# Patient Record
Sex: Female | Born: 1935
Health system: Southern US, Community
[De-identification: ages and names within clinical notes are randomized; demographics above are authoritative.]

## PROBLEM LIST (undated history)

## (undated) DIAGNOSIS — M419 Scoliosis, unspecified: Secondary | ICD-10-CM

## (undated) DIAGNOSIS — K219 Gastro-esophageal reflux disease without esophagitis: Secondary | ICD-10-CM

## (undated) DIAGNOSIS — I480 Paroxysmal atrial fibrillation: Secondary | ICD-10-CM

## (undated) DIAGNOSIS — I340 Nonrheumatic mitral (valve) insufficiency: Secondary | ICD-10-CM

## (undated) DIAGNOSIS — I5042 Chronic combined systolic (congestive) and diastolic (congestive) heart failure: Secondary | ICD-10-CM

## (undated) HISTORY — PX: PARTIAL HYSTERECTOMY: SHX80

## (undated) HISTORY — PX: TONSILLECTOMY AND ADENOIDECTOMY: SHX28

---

## 1998-08-17 ENCOUNTER — Other Ambulatory Visit: Admission: RE | Admit: 1998-08-17 | Discharge: 1998-08-17 | Payer: Self-pay

## 1999-07-16 ENCOUNTER — Other Ambulatory Visit: Admission: RE | Admit: 1999-07-16 | Discharge: 1999-07-16 | Payer: Self-pay | Admitting: *Deleted

## 1999-10-24 ENCOUNTER — Encounter: Admission: RE | Admit: 1999-10-24 | Discharge: 1999-10-24 | Payer: Self-pay | Admitting: Family Medicine

## 1999-10-24 ENCOUNTER — Encounter: Payer: Self-pay | Admitting: Family Medicine

## 2000-01-17 ENCOUNTER — Encounter: Payer: Self-pay | Admitting: Family Medicine

## 2000-01-17 ENCOUNTER — Encounter: Admission: RE | Admit: 2000-01-17 | Discharge: 2000-01-17 | Payer: Self-pay | Admitting: Family Medicine

## 2001-02-27 ENCOUNTER — Encounter: Admission: RE | Admit: 2001-02-27 | Discharge: 2001-02-27 | Payer: Self-pay | Admitting: Family Medicine

## 2001-02-27 ENCOUNTER — Encounter: Payer: Self-pay | Admitting: Family Medicine

## 2001-03-04 ENCOUNTER — Encounter: Admission: RE | Admit: 2001-03-04 | Discharge: 2001-03-04 | Payer: Self-pay | Admitting: Pediatrics

## 2002-03-08 ENCOUNTER — Encounter: Admission: RE | Admit: 2002-03-08 | Discharge: 2002-03-08 | Payer: Self-pay | Admitting: Family Medicine

## 2002-03-08 ENCOUNTER — Encounter: Payer: Self-pay | Admitting: Family Medicine

## 2003-03-03 ENCOUNTER — Encounter: Admission: RE | Admit: 2003-03-03 | Discharge: 2003-03-03 | Payer: Self-pay | Admitting: Family Medicine

## 2003-03-03 ENCOUNTER — Encounter: Payer: Self-pay | Admitting: Family Medicine

## 2003-03-24 ENCOUNTER — Encounter: Admission: RE | Admit: 2003-03-24 | Discharge: 2003-03-24 | Payer: Self-pay | Admitting: Family Medicine

## 2003-03-24 ENCOUNTER — Encounter: Payer: Self-pay | Admitting: Family Medicine

## 2004-04-24 ENCOUNTER — Encounter: Admission: RE | Admit: 2004-04-24 | Discharge: 2004-04-24 | Payer: Self-pay | Admitting: Family Medicine

## 2005-05-07 ENCOUNTER — Encounter: Admission: RE | Admit: 2005-05-07 | Discharge: 2005-05-07 | Payer: Self-pay | Admitting: Family Medicine

## 2006-05-13 ENCOUNTER — Encounter: Admission: RE | Admit: 2006-05-13 | Discharge: 2006-05-13 | Payer: Self-pay | Admitting: Family Medicine

## 2007-05-18 ENCOUNTER — Encounter: Admission: RE | Admit: 2007-05-18 | Discharge: 2007-05-18 | Payer: Self-pay | Admitting: Family Medicine

## 2011-12-12 DIAGNOSIS — Z23 Encounter for immunization: Secondary | ICD-10-CM | POA: Diagnosis not present

## 2011-12-12 DIAGNOSIS — E7800 Pure hypercholesterolemia, unspecified: Secondary | ICD-10-CM | POA: Diagnosis not present

## 2011-12-12 DIAGNOSIS — K219 Gastro-esophageal reflux disease without esophagitis: Secondary | ICD-10-CM | POA: Diagnosis not present

## 2011-12-12 DIAGNOSIS — Z79899 Other long term (current) drug therapy: Secondary | ICD-10-CM | POA: Diagnosis not present

## 2011-12-12 DIAGNOSIS — M810 Age-related osteoporosis without current pathological fracture: Secondary | ICD-10-CM | POA: Diagnosis not present

## 2012-01-06 DIAGNOSIS — M5430 Sciatica, unspecified side: Secondary | ICD-10-CM | POA: Diagnosis not present

## 2012-04-22 DIAGNOSIS — M899 Disorder of bone, unspecified: Secondary | ICD-10-CM | POA: Diagnosis not present

## 2012-04-22 DIAGNOSIS — M949 Disorder of cartilage, unspecified: Secondary | ICD-10-CM | POA: Diagnosis not present

## 2012-06-10 DIAGNOSIS — K219 Gastro-esophageal reflux disease without esophagitis: Secondary | ICD-10-CM | POA: Diagnosis not present

## 2012-06-10 DIAGNOSIS — M810 Age-related osteoporosis without current pathological fracture: Secondary | ICD-10-CM | POA: Diagnosis not present

## 2012-06-10 DIAGNOSIS — Z79899 Other long term (current) drug therapy: Secondary | ICD-10-CM | POA: Diagnosis not present

## 2012-06-10 DIAGNOSIS — IMO0002 Reserved for concepts with insufficient information to code with codable children: Secondary | ICD-10-CM | POA: Diagnosis not present

## 2012-06-10 DIAGNOSIS — E7800 Pure hypercholesterolemia, unspecified: Secondary | ICD-10-CM | POA: Diagnosis not present

## 2012-07-17 DIAGNOSIS — Z23 Encounter for immunization: Secondary | ICD-10-CM | POA: Diagnosis not present

## 2012-09-30 ENCOUNTER — Emergency Department (HOSPITAL_COMMUNITY)
Admission: EM | Admit: 2012-09-30 | Discharge: 2012-09-30 | Disposition: A | Discharge status: Home / Self Care | Payer: Medicare Other | Attending: Emergency Medicine | Admitting: Emergency Medicine

## 2012-09-30 ENCOUNTER — Encounter (HOSPITAL_COMMUNITY): Payer: Self-pay | Admitting: *Deleted

## 2012-09-30 ENCOUNTER — Emergency Department (HOSPITAL_COMMUNITY): Payer: Medicare Other

## 2012-09-30 DIAGNOSIS — Z7982 Long term (current) use of aspirin: Secondary | ICD-10-CM | POA: Insufficient documentation

## 2012-09-30 DIAGNOSIS — R112 Nausea with vomiting, unspecified: Secondary | ICD-10-CM | POA: Diagnosis not present

## 2012-09-30 DIAGNOSIS — R51 Headache: Secondary | ICD-10-CM | POA: Diagnosis not present

## 2012-09-30 DIAGNOSIS — J329 Chronic sinusitis, unspecified: Secondary | ICD-10-CM | POA: Insufficient documentation

## 2012-09-30 DIAGNOSIS — R5381 Other malaise: Secondary | ICD-10-CM | POA: Insufficient documentation

## 2012-09-30 DIAGNOSIS — J019 Acute sinusitis, unspecified: Secondary | ICD-10-CM | POA: Diagnosis not present

## 2012-09-30 DIAGNOSIS — R630 Anorexia: Secondary | ICD-10-CM | POA: Diagnosis not present

## 2012-09-30 DIAGNOSIS — Z79899 Other long term (current) drug therapy: Secondary | ICD-10-CM | POA: Diagnosis not present

## 2012-09-30 DIAGNOSIS — K219 Gastro-esophageal reflux disease without esophagitis: Secondary | ICD-10-CM | POA: Insufficient documentation

## 2012-09-30 DIAGNOSIS — I498 Other specified cardiac arrhythmias: Secondary | ICD-10-CM | POA: Diagnosis not present

## 2012-09-30 DIAGNOSIS — R6889 Other general symptoms and signs: Secondary | ICD-10-CM | POA: Diagnosis not present

## 2012-09-30 DIAGNOSIS — R5383 Other fatigue: Secondary | ICD-10-CM | POA: Insufficient documentation

## 2012-09-30 DIAGNOSIS — R110 Nausea: Secondary | ICD-10-CM | POA: Diagnosis not present

## 2012-09-30 HISTORY — DX: Gastro-esophageal reflux disease without esophagitis: K21.9

## 2012-09-30 LAB — URINALYSIS, ROUTINE W REFLEX MICROSCOPIC
Hgb urine dipstick: NEGATIVE
Ketones, ur: 15 mg/dL — AB
Nitrite: NEGATIVE
Protein, ur: NEGATIVE mg/dL
pH: 8 (ref 5.0–8.0)

## 2012-09-30 LAB — CBC WITH DIFFERENTIAL/PLATELET
Basophils Absolute: 0 10*3/uL (ref 0.0–0.1)
Basophils Relative: 0 % (ref 0–1)
Eosinophils Relative: 0 % (ref 0–5)
Platelets: 140 10*3/uL — ABNORMAL LOW (ref 150–400)
RBC: 4 MIL/uL (ref 3.87–5.11)
WBC: 10.3 10*3/uL (ref 4.0–10.5)

## 2012-09-30 LAB — COMPREHENSIVE METABOLIC PANEL
Albumin: 3.5 g/dL (ref 3.5–5.2)
BUN: 12 mg/dL (ref 6–23)
CO2: 25 mEq/L (ref 19–32)
Calcium: 8.7 mg/dL (ref 8.4–10.5)
Chloride: 102 mEq/L (ref 96–112)
Creatinine, Ser: 0.45 mg/dL — ABNORMAL LOW (ref 0.50–1.10)
GFR calc non Af Amer: 88 mL/min — ABNORMAL LOW (ref >90)
Glucose, Bld: 110 mg/dL — ABNORMAL HIGH (ref 70–99)
Potassium: 3.6 mEq/L (ref 3.5–5.1)
Total Bilirubin: 0.9 mg/dL (ref 0.3–1.2)

## 2012-09-30 LAB — SEDIMENTATION RATE: Sed Rate: 12 mm/hr (ref 0–22)

## 2012-09-30 LAB — GLUCOSE, CAPILLARY: Glucose-Capillary: 124 mg/dL — ABNORMAL HIGH (ref 70–99)

## 2012-09-30 MED ORDER — METOCLOPRAMIDE HCL 5 MG/ML IJ SOLN
10.0000 mg | Freq: Once | INTRAMUSCULAR | Status: AC
  Administered 2012-09-30: 10 mg via INTRAVENOUS
  Filled 2012-09-30: qty 2

## 2012-09-30 MED ORDER — AMOXICILLIN-POT CLAVULANATE 875-125 MG PO TABS
1.0000 | ORAL_TABLET | Freq: Once | ORAL | Status: AC
  Administered 2012-09-30: 1 via ORAL
  Filled 2012-09-30: qty 1

## 2012-09-30 MED ORDER — HYDROCODONE-ACETAMINOPHEN 5-325 MG PO TABS: 1.0000 | ORAL_TABLET | ORAL | Status: DC | PRN

## 2012-09-30 MED ORDER — DIPHENHYDRAMINE HCL 50 MG/ML IJ SOLN
25.0000 mg | Freq: Once | INTRAMUSCULAR | Status: AC
  Administered 2012-09-30: 25 mg via INTRAVENOUS
  Filled 2012-09-30: qty 1

## 2012-09-30 MED ORDER — AMOXICILLIN-POT CLAVULANATE 875-125 MG PO TABS: 1.0000 | ORAL_TABLET | Freq: Two times a day (BID) | ORAL | Status: DC

## 2012-09-30 MED ORDER — DEXAMETHASONE SODIUM PHOSPHATE 4 MG/ML IJ SOLN
10.0000 mg | Freq: Once | INTRAMUSCULAR | Status: AC
  Administered 2012-09-30: 10 mg via INTRAVENOUS
  Filled 2012-09-30: qty 3

## 2012-09-30 MED ORDER — SODIUM CHLORIDE 0.9 % IV SOLN
Freq: Once | INTRAVENOUS | Status: AC
  Administered 2012-09-30: 150 mL/h via INTRAVENOUS

## 2012-09-30 NOTE — ED Notes (Signed)
Pt c/o generalized weakness.  No distress noted.  Resp symmetrical and unlabored.  Skin warm and dry.  Speech is clear and coherent though pt states that she feels extremely weak.

## 2012-09-30 NOTE — ED Provider Notes (Signed)
History     CSN: [id number]  Arrival date & time 09/30/12  4098   First MD Initiated Contact with Patient 09/30/12 1120      Chief Complaint  Patient presents with  . Headache  . Fatigue    (Consider location/radiation/quality/duration/timing/severity/associated sxs/prior treatment) Patient is a 78 y.o. female presenting with headaches. The history is provided by the patient. No language interpreter was used.  Headache  This is a new problem. The current episode started yesterday. Episode frequency: The patient had headache starting yesterday, felt in the right forehead and above her right eye. She took some Aleve, was able to sleep last night. This morning the headache had recurred and she had vomiting. She feels nauseated and very weak. The problem has not changed since onset.The headache is associated with nothing. The pain is located in the right unilateral and frontal region. The quality of the pain is described as throbbing. The pain is severe. The pain does not radiate. Associated symptoms include anorexia, nausea and vomiting. Pertinent negatives include no fever and no shortness of breath. She has tried NSAIDs for the symptoms. The treatment provided mild relief.    Past Medical History  Diagnosis Date  . GERD (gastroesophageal reflux disease)     No past surgical history on file.  No family history on file.  History  Substance Use Topics  . Smoking status: Not on file  . Smokeless tobacco: Not on file  . Alcohol Use:     OB History    Grav Para Term Preterm Abortions TAB SAB Ect Mult Living                  Review of Systems  Constitutional: Negative for fever and chills.  HENT: Negative for ear pain, sore throat and neck pain.   Eyes: Negative.  Negative for photophobia and visual disturbance.  Respiratory: Negative for shortness of breath.   Cardiovascular: Negative.   Gastrointestinal: Positive for nausea, vomiting and anorexia.  Genitourinary:  Negative.   Musculoskeletal: Negative.   Skin: Negative.   Neurological: Positive for headaches.  Psychiatric/Behavioral: Negative.     Allergies  Review of patient's allergies indicates no known allergies.  Home Medications   Current Outpatient Rx  Name  Route  Sig  Dispense  Refill  . ALENDRONATE SODIUM 40 MG PO TABS   Oral   Take 40 mg by mouth every 7 (seven) days. Saturday. Take with a full glass of water on an empty stomach.         . ASPIRIN EC 81 MG PO TBEC   Oral   Take 81 mg by mouth daily.         Marland Kitchen CALCIUM + D PO   Oral   Take 1 tablet by mouth 2 (two) times daily.         . ADULT MULTIVITAMIN W/MINERALS CH   Oral   Take 1 tablet by mouth daily.         Marland Kitchen OMEPRAZOLE 20 MG PO CPDR   Oral   Take 20 mg by mouth daily.         . RED YEAST RICE 600 MG PO CAPS   Oral   Take 1,200 mg by mouth 2 (two) times daily.           BP 129/65  Pulse 91  Temp 97.6 F (36.4 C) (Oral)  Resp 16  Ht 4\' 11"  (1.499 m)  Wt 107 lb (48.535 kg)  BMI 21.61 kg/m2  SpO2 98%  Physical Exam  Nursing note and vitals reviewed. Constitutional: She is oriented to person, place, and time.       Slender elderly lady, moderate distress with right frontal headache.  HENT:  Head: Normocephalic and atraumatic.  Right Ear: External ear normal.  Left Ear: External ear normal.  Mouth/Throat: Oropharynx is clear and moist.  Eyes: Conjunctivae normal and EOM are normal. Pupils are equal, round, and reactive to light.  Neck: Normal range of motion. Neck supple.  Cardiovascular: Normal rate, regular rhythm and normal heart sounds.   Pulmonary/Chest: Effort normal and breath sounds normal.  Abdominal: Soft. Bowel sounds are normal. She exhibits no distension. There is no tenderness.  Musculoskeletal: Normal range of motion.       Trace edema both ankles.  Neurological: She is alert and oriented to person, place, and time.       No sensory or motor deficit.  Skin: Skin is warm  and dry.  Psychiatric: She has a normal mood and affect. Her behavior is normal.    ED Course  Procedures (including critical care time)  Labs Reviewed  GLUCOSE, CAPILLARY - Abnormal; Notable for the following:    Glucose-Capillary 124 (*)     All other components within normal limits  SEDIMENTATION RATE  CBC WITH DIFFERENTIAL  COMPREHENSIVE METABOLIC PANEL  URINE CULTURE   11:38 AM Pt seen --> physical exam performed.  Medications for headache, nausea ordered.  Lab workup and CT of head ordered.  12:01 PM  Date: 09/30/2012  Rate: 100  Rhythm: sinus tachycardia  QRS Axis: normal  Intervals: QT prolonged  ST/T Wave abnormalities: nonspecific T wave changes  Conduction Disutrbances:none  Narrative Interpretation: Abnormal EKG  Old EKG Reviewed: none available  2:32 PM Results for orders placed during the hospital encounter of 09/30/12  GLUCOSE, CAPILLARY      Component Value Range   Glucose-Capillary 124 (*) 70 - 99 mg/dL  SEDIMENTATION RATE      Component Value Range   Sed Rate 12  0 - 22 mm/hr  CBC WITH DIFFERENTIAL      Component Value Range   WBC 10.3  4.0 - 10.5 K/uL   RBC 4.00  3.87 - 5.11 MIL/uL   Hemoglobin 12.9  12.0 - 15.0 g/dL   HCT 96.0  45.4 - 09.8 %   MCV 95.5  78.0 - 100.0 fL   MCH 32.3  26.0 - 34.0 pg   MCHC 33.8  30.0 - 36.0 g/dL   RDW 11.9  14.7 - 82.9 %   Platelets 140 (*) 150 - 400 K/uL   Neutrophils Relative 92 (*) 43 - 77 %   Neutro Abs 9.5 (*) 1.7 - 7.7 K/uL   Lymphocytes Relative 3 (*) 12 - 46 %   Lymphs Abs 0.3 (*) 0.7 - 4.0 K/uL   Monocytes Relative 4  3 - 12 %   Monocytes Absolute 0.4  0.1 - 1.0 K/uL   Eosinophils Relative 0  0 - 5 %   Eosinophils Absolute 0.0  0.0 - 0.7 K/uL   Basophils Relative 0  0 - 1 %   Basophils Absolute 0.0  0.0 - 0.1 K/uL  COMPREHENSIVE METABOLIC PANEL      Component Value Range   Sodium 138  135 - 145 mEq/L   Potassium 3.6  3.5 - 5.1 mEq/L   Chloride 102  96 - 112 mEq/L   CO2 25  19 - 32 mEq/L    Glucose, Bld 110 (*)  70 - 99 mg/dL   BUN 12  6 - 23 mg/dL   Creatinine, Ser 1.61 (*) 0.50 - 1.10 mg/dL   Calcium 8.7  8.4 - 09.6 mg/dL   Total Protein 7.1  6.0 - 8.3 g/dL   Albumin 3.5  3.5 - 5.2 g/dL   AST 30  0 - 37 U/L   ALT 16  0 - 35 U/L   Alkaline Phosphatase 95  39 - 117 U/L   Total Bilirubin 0.9  0.3 - 1.2 mg/dL   GFR calc non Af Amer 88 (*) >90 mL/min   GFR calc Af Amer >90  >90 mL/min  URINALYSIS, ROUTINE W REFLEX MICROSCOPIC      Component Value Range   Color, Urine YELLOW  YELLOW   APPearance CLOUDY (*) CLEAR   Specific Gravity, Urine 1.014  1.005 - 1.030   pH 8.0  5.0 - 8.0   Glucose, UA NEGATIVE  NEGATIVE mg/dL   Hgb urine dipstick NEGATIVE  NEGATIVE   Bilirubin Urine NEGATIVE  NEGATIVE   Ketones, ur 15 (*) NEGATIVE mg/dL   Protein, ur NEGATIVE  NEGATIVE mg/dL   Urobilinogen, UA 0.2  0.0 - 1.0 mg/dL   Nitrite NEGATIVE  NEGATIVE   Leukocytes, UA TRACE (*) NEGATIVE  URINE MICROSCOPIC-ADD ON      Component Value Range   Squamous Epithelial / LPF RARE  RARE   WBC, UA 0-2  <3 WBC/hpf   RBC / HPF 0-2  <3 RBC/hpf   Bacteria, UA MANY (*) RARE   Ct Head Wo Contrast  09/30/2012  *RADIOLOGY REPORT*  Clinical Data: Headache with nausea and vomiting  CT HEAD WITHOUT CONTRAST  Technique:  Contiguous axial images were obtained from the base of the skull through the vertex without contrast.  Comparison: None  Findings: Age appropriate atrophy.  Moderate chronic microvascular ischemic change in the white matter.  Negative for acute infarct. Negative for hemorrhage or mass lesion.  Opacification of the right sphenoid sinus with mild bony thickening due to chronic sinusitis.  Mucosal thickening extends into the right posterior ethmoid sinus.  No air-fluid level.  No acute bony change.  IMPRESSION: Atrophy and chronic microvascular ischemic change.  No acute abnormality.  Chronic sinusitis.   Original Report Authenticated By: Janeece Riggers, M.D.     Lab tests were negative.  CT of head  shows chronic sinusitis in the sphenoid sinus.  Rx augmentin, norco, F/U with Dr. Burnell Blanks, her family physician.  Her heacache has resolved with medication.  1. Sinusitis            Carleene Cooper III, MD 09/30/12 1436

## 2012-09-30 NOTE — ED Notes (Signed)
Per report from [facility] pt arrived with a cc of weakness, and headache.  No distress noted.  Pt states that she is feeling weak.  Pt is able to move all extremities.  Speech non slurred.

## 2012-10-04 LAB — URINE CULTURE

## 2012-10-05 NOTE — ED Notes (Signed)
+  Urine. Patient given Augmentin. No sensitivity listed. Chart sent to EDP office for review.

## 2012-10-06 DIAGNOSIS — J329 Chronic sinusitis, unspecified: Secondary | ICD-10-CM | POA: Diagnosis not present

## 2012-10-06 DIAGNOSIS — R197 Diarrhea, unspecified: Secondary | ICD-10-CM | POA: Diagnosis not present

## 2012-10-10 ENCOUNTER — Telehealth (HOSPITAL_COMMUNITY): Payer: Self-pay | Admitting: Emergency Medicine

## 2012-10-10 NOTE — ED Notes (Signed)
+   urine culture, per Roxy Horseman PA - continue Augmentin, follow-up with PCP for worsening symptoms or fever >102. Patient contacted by phone and states she is taking Augmentin and is "greatly improved"

## 2012-12-14 DIAGNOSIS — E7800 Pure hypercholesterolemia, unspecified: Secondary | ICD-10-CM | POA: Diagnosis not present

## 2012-12-14 DIAGNOSIS — M810 Age-related osteoporosis without current pathological fracture: Secondary | ICD-10-CM | POA: Diagnosis not present

## 2012-12-14 DIAGNOSIS — K219 Gastro-esophageal reflux disease without esophagitis: Secondary | ICD-10-CM | POA: Diagnosis not present

## 2012-12-14 DIAGNOSIS — Z79899 Other long term (current) drug therapy: Secondary | ICD-10-CM | POA: Diagnosis not present

## 2013-06-14 DIAGNOSIS — Z1331 Encounter for screening for depression: Secondary | ICD-10-CM | POA: Diagnosis not present

## 2013-06-14 DIAGNOSIS — IMO0002 Reserved for concepts with insufficient information to code with codable children: Secondary | ICD-10-CM | POA: Diagnosis not present

## 2013-06-14 DIAGNOSIS — Z9181 History of falling: Secondary | ICD-10-CM | POA: Diagnosis not present

## 2013-06-14 DIAGNOSIS — Z79899 Other long term (current) drug therapy: Secondary | ICD-10-CM | POA: Diagnosis not present

## 2013-06-14 DIAGNOSIS — E7800 Pure hypercholesterolemia, unspecified: Secondary | ICD-10-CM | POA: Diagnosis not present

## 2013-07-08 DIAGNOSIS — Z23 Encounter for immunization: Secondary | ICD-10-CM | POA: Diagnosis not present

## 2013-11-23 DIAGNOSIS — H35369 Drusen (degenerative) of macula, unspecified eye: Secondary | ICD-10-CM | POA: Diagnosis not present

## 2013-12-20 DIAGNOSIS — K219 Gastro-esophageal reflux disease without esophagitis: Secondary | ICD-10-CM | POA: Diagnosis not present

## 2013-12-20 DIAGNOSIS — E7800 Pure hypercholesterolemia, unspecified: Secondary | ICD-10-CM | POA: Diagnosis not present

## 2013-12-20 DIAGNOSIS — Z79899 Other long term (current) drug therapy: Secondary | ICD-10-CM | POA: Diagnosis not present

## 2013-12-20 DIAGNOSIS — M810 Age-related osteoporosis without current pathological fracture: Secondary | ICD-10-CM | POA: Diagnosis not present

## 2013-12-20 DIAGNOSIS — Z9181 History of falling: Secondary | ICD-10-CM | POA: Diagnosis not present

## 2013-12-20 DIAGNOSIS — Z1331 Encounter for screening for depression: Secondary | ICD-10-CM | POA: Diagnosis not present

## 2013-12-20 DIAGNOSIS — IMO0002 Reserved for concepts with insufficient information to code with codable children: Secondary | ICD-10-CM | POA: Diagnosis not present

## 2013-12-20 DIAGNOSIS — Z23 Encounter for immunization: Secondary | ICD-10-CM | POA: Diagnosis not present

## 2014-06-22 DIAGNOSIS — K219 Gastro-esophageal reflux disease without esophagitis: Secondary | ICD-10-CM | POA: Diagnosis not present

## 2014-06-22 DIAGNOSIS — E7800 Pure hypercholesterolemia, unspecified: Secondary | ICD-10-CM | POA: Diagnosis not present

## 2014-06-22 DIAGNOSIS — J019 Acute sinusitis, unspecified: Secondary | ICD-10-CM | POA: Diagnosis not present

## 2014-06-22 DIAGNOSIS — Z79899 Other long term (current) drug therapy: Secondary | ICD-10-CM | POA: Diagnosis not present

## 2014-06-22 DIAGNOSIS — M810 Age-related osteoporosis without current pathological fracture: Secondary | ICD-10-CM | POA: Diagnosis not present

## 2014-08-16 DIAGNOSIS — Z23 Encounter for immunization: Secondary | ICD-10-CM | POA: Diagnosis not present

## 2014-12-22 DIAGNOSIS — K219 Gastro-esophageal reflux disease without esophagitis: Secondary | ICD-10-CM | POA: Diagnosis not present

## 2014-12-22 DIAGNOSIS — Z9181 History of falling: Secondary | ICD-10-CM | POA: Diagnosis not present

## 2014-12-22 DIAGNOSIS — Z6820 Body mass index (BMI) 20.0-20.9, adult: Secondary | ICD-10-CM | POA: Diagnosis not present

## 2014-12-22 DIAGNOSIS — E780 Pure hypercholesterolemia: Secondary | ICD-10-CM | POA: Diagnosis not present

## 2014-12-22 DIAGNOSIS — M810 Age-related osteoporosis without current pathological fracture: Secondary | ICD-10-CM | POA: Diagnosis not present

## 2014-12-22 DIAGNOSIS — Z79899 Other long term (current) drug therapy: Secondary | ICD-10-CM | POA: Diagnosis not present

## 2014-12-22 DIAGNOSIS — Z1389 Encounter for screening for other disorder: Secondary | ICD-10-CM | POA: Diagnosis not present

## 2015-02-17 DIAGNOSIS — M25561 Pain in right knee: Secondary | ICD-10-CM | POA: Diagnosis not present

## 2015-02-17 DIAGNOSIS — Z6820 Body mass index (BMI) 20.0-20.9, adult: Secondary | ICD-10-CM | POA: Diagnosis not present

## 2015-02-19 ENCOUNTER — Encounter (HOSPITAL_COMMUNITY): Payer: Self-pay

## 2015-02-19 ENCOUNTER — Other Ambulatory Visit: Payer: Self-pay

## 2015-02-19 ENCOUNTER — Other Ambulatory Visit (HOSPITAL_COMMUNITY): Payer: Self-pay

## 2015-02-19 ENCOUNTER — Inpatient Hospital Stay (HOSPITAL_COMMUNITY)
Admission: EM | Admit: 2015-02-19 | Discharge: 2015-02-25 | DRG: 308 | Disposition: A | Discharge status: Home Health | Payer: Medicare Other | Attending: Internal Medicine | Admitting: Internal Medicine

## 2015-02-19 ENCOUNTER — Emergency Department (HOSPITAL_COMMUNITY): Payer: Medicare Other

## 2015-02-19 DIAGNOSIS — I4891 Unspecified atrial fibrillation: Principal | ICD-10-CM | POA: Diagnosis present

## 2015-02-19 DIAGNOSIS — K219 Gastro-esophageal reflux disease without esophagitis: Secondary | ICD-10-CM | POA: Diagnosis present

## 2015-02-19 DIAGNOSIS — Z7983 Long term (current) use of bisphosphonates: Secondary | ICD-10-CM

## 2015-02-19 DIAGNOSIS — J811 Chronic pulmonary edema: Secondary | ICD-10-CM | POA: Diagnosis not present

## 2015-02-19 DIAGNOSIS — M810 Age-related osteoporosis without current pathological fracture: Secondary | ICD-10-CM | POA: Diagnosis present

## 2015-02-19 DIAGNOSIS — I10 Essential (primary) hypertension: Secondary | ICD-10-CM | POA: Diagnosis present

## 2015-02-19 DIAGNOSIS — Z7982 Long term (current) use of aspirin: Secondary | ICD-10-CM

## 2015-02-19 DIAGNOSIS — E876 Hypokalemia: Secondary | ICD-10-CM | POA: Diagnosis not present

## 2015-02-19 DIAGNOSIS — R7989 Other specified abnormal findings of blood chemistry: Secondary | ICD-10-CM | POA: Diagnosis present

## 2015-02-19 DIAGNOSIS — I272 Other secondary pulmonary hypertension: Secondary | ICD-10-CM | POA: Diagnosis present

## 2015-02-19 DIAGNOSIS — I341 Nonrheumatic mitral (valve) prolapse: Secondary | ICD-10-CM | POA: Diagnosis present

## 2015-02-19 DIAGNOSIS — R0902 Hypoxemia: Secondary | ICD-10-CM | POA: Diagnosis not present

## 2015-02-19 DIAGNOSIS — I509 Heart failure, unspecified: Secondary | ICD-10-CM | POA: Diagnosis not present

## 2015-02-19 DIAGNOSIS — I340 Nonrheumatic mitral (valve) insufficiency: Secondary | ICD-10-CM | POA: Diagnosis not present

## 2015-02-19 DIAGNOSIS — T501X5A Adverse effect of loop [high-ceiling] diuretics, initial encounter: Secondary | ICD-10-CM | POA: Diagnosis not present

## 2015-02-19 DIAGNOSIS — M25561 Pain in right knee: Secondary | ICD-10-CM | POA: Diagnosis present

## 2015-02-19 DIAGNOSIS — R7302 Impaired glucose tolerance (oral): Secondary | ICD-10-CM | POA: Diagnosis not present

## 2015-02-19 DIAGNOSIS — Z9889 Other specified postprocedural states: Secondary | ICD-10-CM | POA: Diagnosis not present

## 2015-02-19 DIAGNOSIS — I248 Other forms of acute ischemic heart disease: Secondary | ICD-10-CM | POA: Diagnosis present

## 2015-02-19 DIAGNOSIS — Z8249 Family history of ischemic heart disease and other diseases of the circulatory system: Secondary | ICD-10-CM

## 2015-02-19 DIAGNOSIS — I952 Hypotension due to drugs: Secondary | ICD-10-CM | POA: Diagnosis present

## 2015-02-19 DIAGNOSIS — I471 Supraventricular tachycardia: Secondary | ICD-10-CM | POA: Diagnosis not present

## 2015-02-19 DIAGNOSIS — I5040 Unspecified combined systolic (congestive) and diastolic (congestive) heart failure: Secondary | ICD-10-CM | POA: Diagnosis not present

## 2015-02-19 DIAGNOSIS — Z66 Do not resuscitate: Secondary | ICD-10-CM | POA: Diagnosis present

## 2015-02-19 DIAGNOSIS — M419 Scoliosis, unspecified: Secondary | ICD-10-CM | POA: Diagnosis present

## 2015-02-19 DIAGNOSIS — I5043 Acute on chronic combined systolic (congestive) and diastolic (congestive) heart failure: Secondary | ICD-10-CM | POA: Diagnosis not present

## 2015-02-19 DIAGNOSIS — I9589 Other hypotension: Secondary | ICD-10-CM | POA: Diagnosis not present

## 2015-02-19 DIAGNOSIS — T447X1A Poisoning by beta-adrenoreceptor antagonists, accidental (unintentional), initial encounter: Secondary | ICD-10-CM | POA: Diagnosis present

## 2015-02-19 DIAGNOSIS — R0602 Shortness of breath: Secondary | ICD-10-CM

## 2015-02-19 DIAGNOSIS — R002 Palpitations: Secondary | ICD-10-CM | POA: Diagnosis not present

## 2015-02-19 DIAGNOSIS — R079 Chest pain, unspecified: Secondary | ICD-10-CM | POA: Diagnosis not present

## 2015-02-19 DIAGNOSIS — I959 Hypotension, unspecified: Secondary | ICD-10-CM | POA: Diagnosis present

## 2015-02-19 HISTORY — DX: Scoliosis, unspecified: M41.9

## 2015-02-19 LAB — I-STAT CHEM 8, ED
BUN: 19 mg/dL (ref 6–20)
CREATININE: 0.7 mg/dL (ref 0.44–1.00)
Calcium, Ion: 1.06 mmol/L — ABNORMAL LOW (ref 1.13–1.30)
Chloride: 105 mmol/L (ref 101–111)
Glucose, Bld: 140 mg/dL — ABNORMAL HIGH (ref 70–99)
HCT: 35 % — ABNORMAL LOW (ref 36.0–46.0)
HEMOGLOBIN: 11.9 g/dL — AB (ref 12.0–15.0)
POTASSIUM: 3.7 mmol/L (ref 3.5–5.1)
SODIUM: 139 mmol/L (ref 135–145)
TCO2: 18 mmol/L (ref 0–100)

## 2015-02-19 LAB — COMPREHENSIVE METABOLIC PANEL
ALK PHOS: 97 U/L (ref 38–126)
ALT: 34 U/L (ref 14–54)
AST: 65 U/L — AB (ref 15–41)
Albumin: 2.8 g/dL — ABNORMAL LOW (ref 3.5–5.0)
Anion gap: 7 (ref 5–15)
BUN: 19 mg/dL (ref 6–20)
CALCIUM: 7.7 mg/dL — AB (ref 8.9–10.3)
CO2: 23 mmol/L (ref 22–32)
Chloride: 108 mmol/L (ref 101–111)
Creatinine, Ser: 0.83 mg/dL (ref 0.44–1.00)
GFR calc Af Amer: >60 mL/min (ref >60)
Glucose, Bld: 140 mg/dL — ABNORMAL HIGH (ref 70–99)
POTASSIUM: 3.7 mmol/L (ref 3.5–5.1)
Sodium: 138 mmol/L (ref 135–145)
Total Bilirubin: 1.4 mg/dL — ABNORMAL HIGH (ref 0.3–1.2)
Total Protein: 5.7 g/dL — ABNORMAL LOW (ref 6.5–8.1)

## 2015-02-19 LAB — TROPONIN I
TROPONIN I: 0.06 ng/mL — AB (ref <0.031)
TROPONIN I: 0.13 ng/mL — AB (ref <0.031)
Troponin I: 0.11 ng/mL — ABNORMAL HIGH (ref <0.031)

## 2015-02-19 LAB — PROTIME-INR
INR: 1.26 (ref 0.00–1.49)
Prothrombin Time: 16 seconds — ABNORMAL HIGH (ref 11.6–15.2)

## 2015-02-19 LAB — CBC
HCT: 35.7 % — ABNORMAL LOW (ref 36.0–46.0)
Hemoglobin: 12 g/dL (ref 12.0–15.0)
MCH: 32.5 pg (ref 26.0–34.0)
MCHC: 33.6 g/dL (ref 30.0–36.0)
MCV: 96.7 fL (ref 78.0–100.0)
Platelets: 152 10*3/uL (ref 150–400)
RBC: 3.69 MIL/uL — ABNORMAL LOW (ref 3.87–5.11)
RDW: 13.2 % (ref 11.5–15.5)
WBC: 6.7 10*3/uL (ref 4.0–10.5)

## 2015-02-19 LAB — I-STAT CG4 LACTIC ACID, ED: LACTIC ACID, VENOUS: 1.84 mmol/L (ref 0.5–2.0)

## 2015-02-19 LAB — ABO/RH: ABO/RH(D): A NEG

## 2015-02-19 LAB — I-STAT TROPONIN, ED
TROPONIN I, POC: 0.01 ng/mL (ref 0.00–0.08)
Troponin i, poc: 0.04 ng/mL (ref 0.00–0.08)

## 2015-02-19 LAB — TSH: TSH: 2.463 u[IU]/mL (ref 0.350–4.500)

## 2015-02-19 LAB — TYPE AND SCREEN
ABO/RH(D): A NEG
ANTIBODY SCREEN: NEGATIVE

## 2015-02-19 LAB — CBG MONITORING, ED: Glucose-Capillary: 124 mg/dL — ABNORMAL HIGH (ref 70–99)

## 2015-02-19 MED ORDER — APIXABAN 2.5 MG PO TABS
2.5000 mg | ORAL_TABLET | Freq: Two times a day (BID) | ORAL | Status: DC
  Administered 2015-02-19 – 2015-02-25 (×12): 2.5 mg via ORAL
  Filled 2015-02-19 (×12): qty 1

## 2015-02-19 MED ORDER — ADULT MULTIVITAMIN W/MINERALS CH
1.0000 | ORAL_TABLET | Freq: Every day | ORAL | Status: DC
  Administered 2015-02-19 – 2015-02-25 (×7): 1 via ORAL
  Filled 2015-02-19 (×7): qty 1

## 2015-02-19 MED ORDER — GUAIFENESIN-DM 100-10 MG/5ML PO SYRP
5.0000 mL | ORAL_SOLUTION | ORAL | Status: DC | PRN
  Administered 2015-02-19: 5 mL via ORAL
  Filled 2015-02-19: qty 5

## 2015-02-19 MED ORDER — SODIUM CHLORIDE 0.9 % IV BOLUS (SEPSIS)
500.0000 mL | Freq: Once | INTRAVENOUS | Status: AC
  Administered 2015-02-19: 500 mL via INTRAVENOUS

## 2015-02-19 MED ORDER — SODIUM CHLORIDE 0.9 % IJ SOLN
3.0000 mL | Freq: Two times a day (BID) | INTRAMUSCULAR | Status: DC
  Administered 2015-02-19 – 2015-02-24 (×11): 3 mL via INTRAVENOUS

## 2015-02-19 MED ORDER — PANTOPRAZOLE SODIUM 40 MG PO TBEC
40.0000 mg | DELAYED_RELEASE_TABLET | Freq: Every day | ORAL | Status: DC
Start: 2015-02-19 — End: 2015-02-25
  Administered 2015-02-19 – 2015-02-25 (×7): 40 mg via ORAL
  Filled 2015-02-19 (×7): qty 1

## 2015-02-19 MED ORDER — ACETAMINOPHEN 325 MG PO TABS
650.0000 mg | ORAL_TABLET | Freq: Every day | ORAL | Status: DC | PRN
  Administered 2015-02-21 – 2015-02-22 (×3): 650 mg via ORAL
  Filled 2015-02-19 (×3): qty 2

## 2015-02-19 MED ORDER — LORAZEPAM 2 MG/ML IJ SOLN
0.5000 mg | Freq: Once | INTRAMUSCULAR | Status: AC
  Administered 2015-02-19: 0.5 mg via INTRAVENOUS
  Filled 2015-02-19: qty 1

## 2015-02-19 MED ORDER — SODIUM CHLORIDE 0.9 % IV BOLUS (SEPSIS): 500.0000 mL | Freq: Once | INTRAVENOUS | Status: DC

## 2015-02-19 NOTE — ED Notes (Signed)
Pt placed on Zoll pads and Steinl MD made aware of pt condition.

## 2015-02-19 NOTE — ED Provider Notes (Signed)
CSN: [id number]     Arrival date & time 02/19/15  0820 History   First MD Initiated Contact with Patient 02/19/15 [phone]     Chief Complaint  Patient presents with  . Atrial Fibrillation     (Consider location/radiation/quality/duration/timing/severity/associated sxs/prior Treatment) HPI   Level V caveat- AMS DNR status   PCP: Leonides Sake, MD  [facility] [place], [place] pressure 75/44, pulse 76, resp. rate 21, height 5\' 3"  (1.6 m), weight 105 lb (47.628 kg), SpO2 99 %.  PMH: GERD and Hypertension  Per EMS patient woke up this morning with complaints of fluttering and pressure in her chest. EMS found her to be rate 160's and a rhythm of a.fib.  A family member gave her 50 mg Metoprolol this morning that was not the patients medications and she declined. In the ED her BP is now 77/56. The patient is cold, pale, diaphoretic, and actively vomiting. She does report feeling short or breath, feeling weak and having pains in her chest. Per EMS after pushing a 250 mL bolus of fluids she develop crackles and vomiting.  Past Medical History  Diagnosis Date  . GERD (gastroesophageal reflux disease)   . Hypertension    History reviewed. No pertinent past surgical history. History reviewed. No pertinent family history. History  Substance Use Topics  . Smoking status: Never Smoker   . Smokeless tobacco: Not on file  . Alcohol Use: Not on file   OB History    No data available     Review of Systems  Level V caveat  - AMS  Allergies  Review of patient's allergies indicates no known allergies.  Home Medications   Prior to Admission medications   Medication Sig Start Date End Date Taking? Authorizing Provider  alendronate (FOSAMAX) 70 MG tablet Take 70 mg by mouth once a week. 01/25/15  Yes Historical Provider, MD  aspirin EC 81 MG tablet Take 81 mg by mouth daily.   Yes Historical Provider, MD  Calcium Carbonate-Vitamin D (CALCIUM + D PO) Take 1 tablet by mouth 2  (two) times daily.   Yes Historical Provider, MD  Multiple Vitamin (MULTIVITAMIN WITH MINERALS) TABS Take 1 tablet by mouth daily.   Yes Historical Provider, MD  omeprazole (PRILOSEC) 20 MG capsule Take 20 mg by mouth daily.   Yes Historical Provider, MD  predniSONE (STERAPRED UNI-PAK 21 TAB) 10 MG (21) TBPK tablet Take 10-60 mg by mouth daily.  02/17/15  Yes Historical Provider, MD  amoxicillin-clavulanate (AUGMENTIN) 875-125 MG per tablet Take 1 tablet by mouth every 12 (twelve) hours. Patient not taking: Reported on 02/19/2015 09/30/12   Mylinda Latina, MD  HYDROcodone-acetaminophen (NORCO/VICODIN) 5-325 MG per tablet Take 1 tablet by mouth every 4 (four) hours as needed for pain. Patient not taking: Reported on 02/19/2015 09/30/12   Mylinda Latina, MD   BP 81/52 mmHg  Pulse 67  Resp 17  Ht 5\' 3"  (1.6 m)  Wt 105 lb (47.628 kg)  BMI 18.60 kg/m2  SpO2 96% Physical Exam  Constitutional: She appears well-developed and well-nourished. She appears lethargic. She appears distressed.  HENT:  Head: Normocephalic and atraumatic. Head is without Battle's sign, without abrasion and without contusion.  Eyes: Pupils are equal, round, and reactive to light.  Neck: Normal range of motion. Neck supple.  Cardiovascular: An irregularly irregular rhythm present. Tachycardia present.   Pulmonary/Chest: Effort normal. She has decreased breath sounds. She has rales.  Abdominal: Soft. Bowel sounds are normal. There is no tenderness.  Neurological:  She appears lethargic.  Skin: Skin is warm. She is diaphoretic. There is pallor.  Nursing note and vitals reviewed.   ED Course  Procedures (including critical care time) Labs Review Labs Reviewed  CBC - Abnormal; Notable for the following:    RBC 3.69 (*)    HCT 35.7 (*)    All other components within normal limits  COMPREHENSIVE METABOLIC PANEL - Abnormal; Notable for the following:    Glucose, Bld 140 (*)    Calcium 7.7 (*)    Total Protein 5.7 (*)     Albumin 2.8 (*)    AST 65 (*)    Total Bilirubin 1.4 (*)    All other components within normal limits  PROTIME-INR - Abnormal; Notable for the following:    Prothrombin Time 16.0 (*)    All other components within normal limits  I-STAT CHEM 8, ED - Abnormal; Notable for the following:    Glucose, Bld 140 (*)    Calcium, Ion 1.06 (*)    Hemoglobin 11.9 (*)    HCT 35.0 (*)    All other components within normal limits  CBG MONITORING, ED - Abnormal; Notable for the following:    Glucose-Capillary 124 (*)    All other components within normal limits  URINALYSIS, ROUTINE W REFLEX MICROSCOPIC  I-STAT TROPOININ, ED  I-STAT CG4 LACTIC ACID, ED  I-STAT TROPOININ, ED  TYPE AND SCREEN  ABO/RH    Imaging Review Dg Chest Port 1 View  02/19/2015   CLINICAL DATA:  Atrial fibrillation.  EXAM: PORTABLE CHEST - 1 VIEW  COMPARISON:  None.  FINDINGS: There is slight interstitial pulmonary edema. There is no airspace consolidation. The heart size and pulmonary vascularity are normal. No adenopathy. There is thoracolumbar levoscoliosis. Note multiple overlying leads and paddles.  IMPRESSION: Mild interstitial edema. No airspace consolidation. Heart size within normal limits.   Electronically Signed   By: Lowella Grip III M.D.   On: 02/19/2015 09:20     EKG Interpretation None     ED ECG REPORT   Date: 02/19/2015  Rate: 129  Rhythm: atrial fibrillation  QRS Axis: normal  Intervals: normal  ST/T Wave abnormalities: normal  Conduction Disutrbances:none  Narrative Interpretation:   Old EKG Reviewed: none available  I have personally reviewed the EKG tracing and agree with the computerized printout as noted.   MDM   Final diagnoses:  SOB (shortness of breath)  Atrial fibrillation with tachycardic ventricular rate  Chest pain, unspecified chest pain type   8: 48am Discussed case with Dr. Ashok Cordia, he has seen patient and spoken with family members. The patient does not want heroic life  saving efforts to be taken to save her life.  9:00am Synchronized cardioversion successful, pt now normal rate and rhythm.  Pts blood pressure improving, now 84/55.  10: 29 am Dr. Heber [place], Internal Medicine teaching service has agreed to admit the patient. He declines holding orders as they will be on there way to see the patient now. Patient originally altered on arrival but is now resting and mentation at baseline during interactions.  Filed Vitals:   02/19/15 1036  BP: 81/52  Pulse: 67  Resp: [address], PA-C 02/19/15 [place], MD 02/19/15 1145

## 2015-02-19 NOTE — ED Notes (Signed)
Lab results reported to [place].

## 2015-02-19 NOTE — ED Notes (Signed)
Dr. Ashok Cordia at the bedside to perform synchronized cardioversion, consent obtained, pt has no further questions at the time. Pt on zoll and pads. She is alert and oriented x4.

## 2015-02-19 NOTE — H&P (Signed)
Date: 02/19/2015               Patient Name:  Christina Buckley MRN: [id number]  DOB: 1923/11/12 Age / Sex: 81 y.o., female   PCP: Leonides Sake, MD         Medical Service: Internal Medicine Teaching Service         Attending Physician: Dr. Lynnae January     First Contact: Dr. Albin Felling Pager: [phone]  Second Contact: Dr. Joni Reining  Pager: [phone]       After Hours (After 5p/  First Contact Pager: [phone]  weekends / holidays): Second Contact Pager: [phone]   Chief Complaint: Shortness of breath, palpitations   History of Present Illness: Christina Buckley is a 81yo woman with PMHx of GERD, osteoporosis, and scoliosis who presents to the ED with shortness of breath and palpitations found to be in atrial fibrillation with RVR with HR in 160s. She was given metoprolol 50 mg by one of her family members this morning. Patient reports she started feeling palpitations last night and that she wasn't breathing well. She noted palpitations again this morning and felt weak. She told a family member that she wanted to go to the hospital but then became very weak and was unable to make it to the car so EMS was called. She denies any chest pain. She reported feeling dizzy and that she might pass out.   In the ED, patient noted to have hypotension in 08Q-76P systolic and actively vomiting. She was given 1.5 L bolus. Synchronized cardioversion was performed and was successful. Her BP has now improved to 95K-932I systolic.   Meds: Current Facility-Administered Medications  Medication Dose Route Frequency Provider Last Rate Last Dose  . sodium chloride 0.9 % bolus 500 mL  500 mL Intravenous Once Lajean Saver, MD   Stopped at 02/19/15 [phone]  . sodium chloride 0.9 % bolus 500 mL  500 mL Intravenous Once Delos Haring, PA-C 500 mL/hr at 02/19/15 1041 500 mL at 02/19/15 1041   Current Outpatient Prescriptions  Medication Sig Dispense Refill  . acetaminophen (TYLENOL) 325 MG tablet Take 650 mg by mouth daily  as needed for mild pain.    Marland Kitchen alendronate (FOSAMAX) 70 MG tablet Take 70 mg by mouth once a week. Take on Saturdays  2  . aspirin EC 81 MG tablet Take 81 mg by mouth daily.    . Calcium Carbonate-Vitamin D (CALCIUM + D PO) Take 1 tablet by mouth 2 (two) times daily.    . Multiple Vitamin (MULTIVITAMIN WITH MINERALS) TABS Take 1 tablet by mouth daily.    . Omega-3 Fatty Acids (FISH OIL PO) Take 1 capsule by mouth 2 (two) times daily.    Marland Kitchen omeprazole (PRILOSEC) 20 MG capsule Take 20 mg by mouth daily.    . Plant Sterols and Stanols (CHOLESTOFF PO) Take 1 capsule by mouth 2 (two) times daily.    . predniSONE (STERAPRED UNI-PAK 21 TAB) 10 MG (21) TBPK tablet Take 60 mg by mouth once.     . Red Yeast Rice 600 MG CAPS Take 1,200 mg by mouth 2 (two) times daily.    Marland Kitchen amoxicillin-clavulanate (AUGMENTIN) 875-125 MG per tablet Take 1 tablet by mouth every 12 (twelve) hours. (Patient not taking: Reported on 02/19/2015) 20 tablet 0  . HYDROcodone-acetaminophen (NORCO/VICODIN) 5-325 MG per tablet Take 1 tablet by mouth every 4 (four) hours as needed for pain. (Patient not taking: Reported on 02/19/2015) 12 tablet 0  Allergies: Allergies as of 02/19/2015  . (No Known Allergies)   Past Medical History  Diagnosis Date  . GERD (gastroesophageal reflux disease)   . Hypertension    History reviewed. No pertinent past surgical history. History reviewed. No pertinent family history. History   Social History  . Marital Status: Married    Spouse Name: N/A  . Number of Children: N/A  . Years of Education: N/A   Occupational History  . Not on file.   Social History Main Topics  . Smoking status: Never Smoker   . Smokeless tobacco: Not on file  . Alcohol Use: Not on file  . Drug Use: Not on file  . Sexual Activity: Not on file   Other Topics Concern  . Not on file   Social History Narrative    Review of Systems: General: Denies fever, chills, night sweats, changes in weight, changes in  appetite HEENT: Denies headaches, ear pain, changes in vision, rhinorrhea, sore throat CV: Denies orthopnea Pulm: Denies cough, wheezing GI: Denies abdominal pain, diarrhea, constipation, melena, hematochezia GU: Denies dysuria, hematuria, frequency Msk: Denies muscle cramps, joint pains Neuro: Denies numbness, tingling Skin: Denies rashes, bruising  Physical Exam: Blood pressure 100/58, pulse 96, resp. rate 19, height 5\' 3"  (1.6 m), weight 105 lb (47.628 kg), SpO2 94 %. General: elderly frail sitting up in bed, pleasant, NAD  HEENT: [place]/AT, EOMI, sclera anicteric, mucus membranes moist CV: RRR, no m/g/r Pulm: CTA bilaterally, breathing comfortably on 2 L oxygen via [place] Abd: BS+, soft, non-tender, non-distended  Ext: warm, no edema, moves all Neuro: alert and oriented x 3, no focal deficits, strength intact  Lab results: Basic Metabolic Panel:  Recent Labs  02/19/15 0840 02/19/15 0900  NA 138 139  K 3.7 3.7  CL 108 105  CO2 23  --   GLUCOSE 140* 140*  BUN 19 19  CREATININE 0.83 0.70  CALCIUM 7.7*  --    Liver Function Tests:  Recent Labs  02/19/15 0840  AST 65*  ALT 34  ALKPHOS 97  BILITOT 1.4*  PROT 5.7*  ALBUMIN 2.8*   CBC:  Recent Labs  02/19/15 0840 02/19/15 0900  WBC 6.7  --   HGB 12.0 11.9*  HCT 35.7* 35.0*  MCV 96.7  --   PLT 152  --    CBG:  Recent Labs  02/19/15 0849  GLUCAP 124*   Coagulation:  Recent Labs  02/19/15 0840  LABPROT 16.0*  INR 1.26   Imaging results:  Dg Chest Port 1 View  02/19/2015   CLINICAL DATA:  Atrial fibrillation.  EXAM: PORTABLE CHEST - 1 VIEW  COMPARISON:  None.  FINDINGS: There is slight interstitial pulmonary edema. There is no airspace consolidation. The heart size and pulmonary vascularity are normal. No adenopathy. There is thoracolumbar levoscoliosis. Note multiple overlying leads and paddles.  IMPRESSION: Mild interstitial edema. No airspace consolidation. Heart size within normal limits.    Electronically Signed   By: Lowella Grip III M.D.   On: 02/19/2015 09:20    Other results: EKG: Atrial fibrillation with HR 129   Assessment & Plan by Problem:  AFib with RVR s/p Cardioversion: Patient presented with palpitations, dyspnea, and weakness found to be in atrial fibrillation with RVR in 160s. Synchronized cardioversion was performed and successfully placed her back into NSR. Her CHADS-VASc score is 4 which gives her a 4.8% risk of stroke per year and anticoagulation is indicated. Discussed starting Eliquis with patient and she is agreeable. Unclear etiology,  patient has history of HTN but is not on antihypertensives at home. She is not currently having symptoms and EKG not concerning for MI. No other risk factors including history of CAD, valvular disease, heart failure, diabetes, and obesity. Will evaluate for possible structural abnormalities with an echocardiogram. - Admit to telemetry bed - Start Eliquis 2.5 mg BID - 2D Echo ordered - Check TSH - Repeat EKG in AM - Trend troponins x 3 - Oxygen therapy as needed   Hypotension: Patient has history of HTN but is not on BP medications at home. She was noted to be hypotensive on admission with systolic BPs in 33I-95J. She received Metoprolol 50 mg from one of her family members this morning to help her elevated HR, which potentially could be lowering her BP. She received a 1.5L bolus and her BP improved to the 88C-166A systolic.  - Continue to monitor   Elevated Glucose: Glucose 140 on cmet. Patient recently took Prednisone 60 mg x 1 (on 4/29) for knee pain. Likely related to her recent steroid use.  - Continue to monitor   Elevated LFTs: AST 65, TBili 1.4. Patient denies any alcohol use. She also denies abdominal pain. - Repeat cmet in AM  GERD: Patient takes Prilosec 20 mg daily. - Continue home Prilosec   Likely Osteopenia: Presumed diagnosis since patient is taking Fosamax 70 mg Qweekly. She is also on calcium and  vitamin D supplementation.  - Continue home multivitamin  - PT/OT consult   Scoliosis: Patient reported and noted on CXR. Patient takes Norco 5-325 mg Q4H PRN pain at home.  - Hold home pain meds for now given hypotension   Code: Patient wishes to be DNR Diet: Regular  VTE PPx: Start Eliquis  Dispo: Disposition is deferred at this time, awaiting improvement of current medical problems. Anticipated discharge in approximately 1-2 day(s).   The patient does have a current PCP (Leonides Sake, MD) and does need an [facility] hospital follow-up appointment after discharge.  The patient does not have transportation limitations that hinder transportation to clinic appointments.  Signed: Juliet Rude, MD 02/19/2015, 11:23 AM

## 2015-02-19 NOTE — Progress Notes (Signed)
Dr. Arcelia Jew updated with mildly elevated troponin I level of 0.06 . No new orders.

## 2015-02-19 NOTE — ED Notes (Signed)
Pt brought in EMS for fluttering and pressure in her chest.  Pt is in Afib around the 160's with no history.  Family gave pt Metoprolol this morning PTA and pt is now hypotensive at 80/52 and reports that she feels weak.  Per EMS her lung fields were clear, they administered a 250cc bolus and pt now has crackles per EMS.

## 2015-02-19 NOTE — ED Notes (Signed)
Pt is now c/o SOB and chest tightness.

## 2015-02-19 NOTE — ED Notes (Signed)
Time out performed, synchronized cardioversion performed at [place]. Pt tolerated without difficulty, atrial fibrillation, rate of 79 bpm noted on monitor. Pt is alert and oriented x4.

## 2015-02-19 NOTE — Progress Notes (Signed)
Pt taken off NRB and placed on 6L [place] due to nausea with small amount of vomit. Pt spo2 96%. RT will continue to monitor.

## 2015-02-20 ENCOUNTER — Encounter (HOSPITAL_COMMUNITY): Payer: Self-pay | Admitting: Cardiology

## 2015-02-20 ENCOUNTER — Observation Stay (HOSPITAL_COMMUNITY): Payer: Medicare Other

## 2015-02-20 DIAGNOSIS — I4891 Unspecified atrial fibrillation: Secondary | ICD-10-CM | POA: Diagnosis not present

## 2015-02-20 DIAGNOSIS — I340 Nonrheumatic mitral (valve) insufficiency: Secondary | ICD-10-CM | POA: Diagnosis not present

## 2015-02-20 DIAGNOSIS — I959 Hypotension, unspecified: Secondary | ICD-10-CM

## 2015-02-20 DIAGNOSIS — R531 Weakness: Secondary | ICD-10-CM | POA: Diagnosis not present

## 2015-02-20 DIAGNOSIS — R7309 Other abnormal glucose: Secondary | ICD-10-CM

## 2015-02-20 DIAGNOSIS — M419 Scoliosis, unspecified: Secondary | ICD-10-CM

## 2015-02-20 DIAGNOSIS — Z9889 Other specified postprocedural states: Secondary | ICD-10-CM | POA: Diagnosis not present

## 2015-02-20 DIAGNOSIS — R0902 Hypoxemia: Secondary | ICD-10-CM

## 2015-02-20 DIAGNOSIS — J90 Pleural effusion, not elsewhere classified: Secondary | ICD-10-CM | POA: Diagnosis not present

## 2015-02-20 DIAGNOSIS — R945 Abnormal results of liver function studies: Secondary | ICD-10-CM

## 2015-02-20 DIAGNOSIS — I509 Heart failure, unspecified: Secondary | ICD-10-CM | POA: Diagnosis not present

## 2015-02-20 DIAGNOSIS — K219 Gastro-esophageal reflux disease without esophagitis: Secondary | ICD-10-CM

## 2015-02-20 DIAGNOSIS — I5043 Acute on chronic combined systolic (congestive) and diastolic (congestive) heart failure: Secondary | ICD-10-CM | POA: Diagnosis not present

## 2015-02-20 DIAGNOSIS — I248 Other forms of acute ischemic heart disease: Secondary | ICD-10-CM | POA: Diagnosis not present

## 2015-02-20 DIAGNOSIS — I272 Other secondary pulmonary hypertension: Secondary | ICD-10-CM | POA: Diagnosis not present

## 2015-02-20 LAB — COMPREHENSIVE METABOLIC PANEL
ALT: 52 U/L (ref 14–54)
AST: 66 U/L — ABNORMAL HIGH (ref 15–41)
Albumin: 3 g/dL — ABNORMAL LOW (ref 3.5–5.0)
Alkaline Phosphatase: 114 U/L (ref 38–126)
Anion gap: 10 (ref 5–15)
BUN: 16 mg/dL (ref 6–20)
CO2: 25 mmol/L (ref 22–32)
Calcium: 8 mg/dL — ABNORMAL LOW (ref 8.9–10.3)
Chloride: 102 mmol/L (ref 101–111)
Creatinine, Ser: 0.8 mg/dL (ref 0.44–1.00)
GFR calc Af Amer: >60 mL/min (ref >60)
GLUCOSE: 137 mg/dL — AB (ref 70–99)
Potassium: 3.6 mmol/L (ref 3.5–5.1)
SODIUM: 137 mmol/L (ref 135–145)
TOTAL PROTEIN: 6.4 g/dL — AB (ref 6.5–8.1)
Total Bilirubin: 1.6 mg/dL — ABNORMAL HIGH (ref 0.3–1.2)

## 2015-02-20 MED ORDER — GUAIFENESIN-CODEINE 100-10 MG/5ML PO SOLN
5.0000 mL | ORAL | Status: AC | PRN
  Administered 2015-02-21 – 2015-02-22 (×3): 5 mL via ORAL
  Filled 2015-02-20 (×4): qty 5

## 2015-02-20 MED ORDER — FUROSEMIDE 10 MG/ML IJ SOLN
20.0000 mg | Freq: Once | INTRAMUSCULAR | Status: AC
  Administered 2015-02-20: 20 mg via INTRAVENOUS

## 2015-02-20 MED ORDER — FUROSEMIDE 10 MG/ML IJ SOLN
INTRAMUSCULAR | Status: AC
  Administered 2015-02-20: 20 mg via INTRAVENOUS
  Filled 2015-02-20: qty 2

## 2015-02-20 MED ORDER — OFF THE BEAT BOOK
Freq: Once | Status: AC
  Administered 2015-02-20: 08:00:00
  Filled 2015-02-20: qty 1

## 2015-02-20 MED ORDER — METOPROLOL TARTRATE 1 MG/ML IV SOLN
5.0000 mg | Freq: Once | INTRAVENOUS | Status: AC
  Administered 2015-02-20: 5 mg via INTRAVENOUS
  Filled 2015-02-20: qty 5

## 2015-02-20 NOTE — Progress Notes (Signed)
OT Cancellation Note  Patient Details Name: QUISHA MABIE MRN: [id number] DOB: Apr 19, 1924   Cancelled Treatment:    Reason Eval/Treat Not Completed: Medical issues which prohibited therapy. Nursing in with pt and informed OT that HR 130-140s and to hold off on pt. Will re attempt next appropriate/avalaible time  Britt Bottom 02/20/2015, 2:23 PM

## 2015-02-20 NOTE — Evaluation (Signed)
Physical Therapy Evaluation Patient Details Name: Christina Buckley MRN: [id number] DOB: [birth year] Today's Date: 02/20/2015   History of Present Illness  Ms. Scroggs is a 81yo woman with PMHx of GERD, osteoporosis, and scoliosis who presents to the ED with shortness of breath and palpitations found to be in atrial fibrillation with RVR with HR in 160s  Clinical Impression  Patient presents with decreased independence with mobility due to deficits listed in PT problem list.  She will benefit from skilled PT in the acute setting to allow return home with intermittent family support.  Currently with high O2 requirement.  Hopes to wean down and be able to go home without O2.  Encouraged frequent mobilization with nursing assist.  Do NOT feel has HHPT needs at this time.    Follow Up Recommendations No PT follow up    Equipment Recommendations  None recommended by PT    Recommendations for Other Services       Precautions / Restrictions Precautions Precautions: Fall Precaution Comments: reliant on walker at home, new O2 dependency      Mobility  Bed Mobility Overal bed mobility: Needs Assistance Bed Mobility: Supine to Sit;Sit to Supine     Supine to sit: Supervision;HOB elevated Sit to supine: Supervision   General bed mobility comments: assist due to multiple lines (O2, telemetry, O2 sat)  Transfers Overall transfer level: Needs assistance Equipment used: Rolling walker (2 wheeled) Transfers: Sit to/from Stand Sit to Stand: Supervision         General transfer comment: due to lines  Ambulation/Gait Ambulation/Gait assistance: Min guard;Supervision Ambulation Distance (Feet): 200 Feet Assistive device: Rolling walker (2 wheeled) Gait Pattern/deviations: Step-through pattern;Decreased stride length;Trunk flexed     General Gait Details: reliant on walker, able to straighten up and stand with less UE support static, but stays flexed during gait with  kyphoscoliosis  Stairs            Wheelchair Mobility    Modified Rankin (Stroke Patients Only)       Balance Overall balance assessment: Needs assistance         Standing balance support: No upper extremity supported Standing balance-Leahy Scale: Fair Standing balance comment: can stand static without UE support, reliant on walker with ambulation                             Pertinent Vitals/Pain Pain Assessment: No/denies pain    Home Living Family/patient expects to be discharged to:: Private residence Living Arrangements: Alone Available Help at Discharge: Family;Available PRN/intermittently Type of Home: House Home Access: Stairs to enter Entrance Stairs-Rails: Right Entrance Stairs-Number of Steps: 4 Home Layout: Two level;Able to live on main level with bedroom/bathroom Home Equipment: Gilford Rile - 2 wheels;Bedside commode;Shower seat Additional Comments: does not use shower seat or BSC    Prior Function Level of Independence: Independent with assistive device(s)         Comments: active going to local grocery, bank, dollar store, church and post office, son visits every day and daughter lives close     Hand Dominance        Extremity/Trunk Assessment   Upper Extremity Assessment: Generalized weakness           Lower Extremity Assessment: Generalized weakness      Cervical / Trunk Assessment: Other exceptions;Kyphotic  Communication   Communication: No difficulties  Cognition Arousal/Alertness: Awake/alert Behavior During Therapy: WFL for tasks assessed/performed Overall Cognitive Status: Within Functional  Limits for tasks assessed                      General Comments      Exercises        Assessment/Plan    PT Assessment Patient needs continued PT services  PT Diagnosis Difficulty walking;Generalized weakness   PT Problem List Decreased strength;Decreased mobility;Decreased activity  tolerance;Cardiopulmonary status limiting activity  PT Treatment Interventions DME instruction;Therapeutic exercise;Gait training;Functional mobility training;Therapeutic activities;Patient/family education;Stair training;Balance training   PT Goals (Current goals can be found in the Care Plan section) Acute Rehab PT Goals Patient Stated Goal: To go home PT Goal Formulation: With patient/family Time For Goal Achievement: 02/27/15 Potential to Achieve Goals: Good    Frequency Min 3X/week   Barriers to discharge Decreased caregiver support      Co-evaluation               End of Session Equipment Utilized During Treatment: Gait belt;Oxygen Activity Tolerance: Patient tolerated treatment well Patient left: in bed;with call bell/phone within reach;with family/visitor present      Functional Assessment Tool Used: Clinical Judgement Functional Limitation: Mobility: Walking and moving around Mobility: Walking and Moving Around Current Status (G9924): At least 20 percent but less than 40 percent impaired, limited or restricted Mobility: Walking and Moving Around Goal Status [phone]): At least 1 percent but less than 20 percent impaired, limited or restricted    Time: 0908-0933 PT Time Calculation (min) (ACUTE ONLY): 25 min   Charges:   PT Evaluation $Initial PT Evaluation Tier I: 1 Procedure PT Treatments $Gait Training: 8-22 mins   PT G Codes:   PT G-Codes **NOT FOR INPATIENT CLASS** Functional Assessment Tool Used: Clinical Judgement Functional Limitation: Mobility: Walking and moving around Mobility: Walking and Moving Around Current Status (H9622): At least 20 percent but less than 40 percent impaired, limited or restricted Mobility: Walking and Moving Around Goal Status [phone]): At least 1 percent but less than 20 percent impaired, limited or restricted    [facility] 02/20/2015, 11:25 AM  Magda Kiel, [place] 02/20/2015

## 2015-02-20 NOTE — Consult Note (Signed)
Reason for Consult: a fib with RVR   Referring Physician: Dr. Arcelia Jew   PCP:  Leonides Sake, MD  Primary Cardiologist:New (her husband had seen Dr. Sallyanne Kuster and her sister see Dr. Sallyanne Kuster)  Christina Buckley is an 81 y.o. female.    Chief Complaint: admitted 02/19/15 with SOB and palpitations    HPI: 81yo woman with PMHx of GERD, osteoporosis, and scoliosis and no known CAD + MR who presented to the ED 02/19/15 with shortness of breath and palpitations found to be in atrial fibrillation with RVR with HR in 160s. She was given metoprolol 50 mg by one of her family members before arrival and BP was in the 50V systolic.  Patient reports she started feeling palpitations the night before admit and that she wasn't breathing well. ( She does have occ. Rapid HR at times in the past, not frequently) She noted palpitations again yesterday morning and felt weak. She told a family member that she wanted to go to the hospital but then became very weak and was unable to make it to the car so EMS was called. She denied any chest pain. She reported feeling dizzy and that she might pass out.   In the ED, patient noted to have hypotension in 69V-94I systolic and actively vomiting. She was given 1.5 L bolus. Synchronized cardioversion was performed and was successful. Her BP improved to 01K-553Z systolic.   Unfortunately she has had what appears to be Atrial Tach at times, regular- she did tolerate 5 mg IV metoprolol today with slowing to SR.   BP remains soft 97/59, 102/69.  Troponin is elevated at 0.13, though no prior cardiac eval. Echo ordered but I do not see that it has been done. TSH WNL.  CXR with worsening CHF yesterday.  EKG with regular tachycardia, A tach.  She has SR with PACs as well.   She has some CHF and was given some lasix and is neg 1070 yesterday.  She has history Mitral murmur.   CHADS-VASc score of 4--pt was placed on eliquis.  Due to hypotension difficult to place on cardizem or  BB.  May need antiarrythmic.   Pt is DNR but wanting treatment.   Tele: review on tele Sr with PACs.    Past Medical History  Diagnosis Date  . GERD (gastroesophageal reflux disease)   . Scoliosis   . Heart murmur     History reviewed. No pertinent past surgical history.  Family History  Problem Relation Age of Onset  . Heart attack Father     in his 24s  . Diabetes Mother   . Heart attack Mother     lived to 15   Social History:  reports that she has never smoked. She does not have any smokeless tobacco history on file. Her alcohol and drug histories are not on file.  Allergies: No Known Allergies  Scheduled Meds: . apixaban  2.5 mg Oral Q12H  . multivitamin with minerals  1 tablet Oral Daily  . pantoprazole  40 mg Oral Daily  . sodium chloride  500 mL Intravenous Once  . sodium chloride  3 mL Intravenous Q12H   Continuous Infusions:  PRN Meds:.acetaminophen, guaiFENesin-codeine  Results for orders placed or performed during the hospital encounter of 02/19/15 (from the past 48 hour(s))  CBC     Status: Abnormal   Collection Time: 02/19/15  8:40 AM  Result Value Ref Range   WBC 6.7 4.0 - 10.5  K/uL   RBC 3.69 (L) 3.87 - 5.11 MIL/uL   Hemoglobin 12.0 12.0 - 15.0 g/dL   HCT 35.7 (L) 36.0 - 46.0 %   MCV 96.7 78.0 - 100.0 fL   MCH 32.5 26.0 - 34.0 pg   MCHC 33.6 30.0 - 36.0 g/dL   RDW 13.2 11.5 - 15.5 %   Platelets 152 150 - 400 K/uL  Comprehensive metabolic panel     Status: Abnormal   Collection Time: 02/19/15  8:40 AM  Result Value Ref Range   Sodium 138 135 - 145 mmol/L   Potassium 3.7 3.5 - 5.1 mmol/L   Chloride 108 101 - 111 mmol/L   CO2 23 22 - 32 mmol/L   Glucose, Bld 140 (H) 70 - 99 mg/dL   BUN 19 6 - 20 mg/dL   Creatinine, Ser 0.83 0.44 - 1.00 mg/dL   Calcium 7.7 (L) 8.9 - 10.3 mg/dL   Total Protein 5.7 (L) 6.5 - 8.1 g/dL   Albumin 2.8 (L) 3.5 - 5.0 g/dL   AST 65 (H) 15 - 41 U/L   ALT 34 14 - 54 U/L   Alkaline Phosphatase 97 38 - 126 U/L   Total  Bilirubin 1.4 (H) 0.3 - 1.2 mg/dL   GFR calc non Af Amer >60 >60 mL/min   GFR calc Af Amer >60 >60 mL/min    Comment: (NOTE) The eGFR has been calculated using the CKD EPI equation. This calculation has not been validated in all clinical situations. eGFR's persistently <90 mL/min signify possible Chronic Kidney Disease.    Anion gap 7 5 - 15  Protime-INR     Status: Abnormal   Collection Time: 02/19/15  8:40 AM  Result Value Ref Range   Prothrombin Time 16.0 (H) 11.6 - 15.2 seconds   INR 1.26 0.00 - 1.49  CBG monitoring, ED     Status: Abnormal   Collection Time: 02/19/15  8:49 AM  Result Value Ref Range   Glucose-Capillary 124 (H) 70 - 99 mg/dL   Comment 1 Notify RN    Comment 2 Document in Chart   Type and screen     Status: None   Collection Time: 02/19/15  8:56 AM  Result Value Ref Range   ABO/RH(D) A NEG    Antibody Screen NEG    Sample Expiration 02/22/2015   ABO/Rh     Status: None   Collection Time: 02/19/15  8:56 AM  Result Value Ref Range   ABO/RH(D) A NEG   I-stat troponin, ED     Status: None   Collection Time: 02/19/15  8:58 AM  Result Value Ref Range   Troponin i, poc 0.01 0.00 - 0.08 ng/mL   Comment 3            Comment: Due to the release kinetics of cTnI, a negative result within the first hours of the onset of symptoms does not rule out myocardial infarction with certainty. If myocardial infarction is still suspected, repeat the test at appropriate intervals.   I-stat chem 8, ed     Status: Abnormal   Collection Time: 02/19/15  9:00 AM  Result Value Ref Range   Sodium 139 135 - 145 mmol/L   Potassium 3.7 3.5 - 5.1 mmol/L   Chloride 105 101 - 111 mmol/L   BUN 19 6 - 20 mg/dL   Creatinine, Ser 0.70 0.44 - 1.00 mg/dL   Glucose, Bld 140 (H) 70 - 99 mg/dL   Calcium, Ion 1.06 (L) 1.13 -  1.30 mmol/L   TCO2 18 0 - 100 mmol/L   Hemoglobin 11.9 (L) 12.0 - 15.0 g/dL   HCT 35.0 (L) 36.0 - 46.0 %  I-Stat CG4 Lactic Acid, ED     Status: None   Collection  Time: 02/19/15  9:00 AM  Result Value Ref Range   Lactic Acid, Venous 1.84 0.5 - 2.0 mmol/L  Troponin I (q 6hr x 3)     Status: Abnormal   Collection Time: 02/19/15 11:19 AM  Result Value Ref Range   Troponin I 0.06 (H) <0.031 ng/mL    Comment:        PERSISTENTLY INCREASED TROPONIN VALUES IN THE RANGE OF 0.04-0.49 ng/mL CAN BE SEEN IN:       -UNSTABLE ANGINA       -CONGESTIVE HEART FAILURE       -MYOCARDITIS       -CHEST TRAUMA       -ARRYHTHMIAS       -LATE PRESENTING MYOCARDIAL INFARCTION       -COPD   CLINICAL FOLLOW-UP RECOMMENDED.   I-stat troponin, ED     Status: None   Collection Time: 02/19/15 11:33 AM  Result Value Ref Range   Troponin i, poc 0.04 0.00 - 0.08 ng/mL   Comment 3            Comment: Due to the release kinetics of cTnI, a negative result within the first hours of the onset of symptoms does not rule out myocardial infarction with certainty. If myocardial infarction is still suspected, repeat the test at appropriate intervals.   Troponin I (q 6hr x 3)     Status: Abnormal   Collection Time: 02/19/15  5:48 PM  Result Value Ref Range   Troponin I 0.13 (H) <0.031 ng/mL    Comment:        PERSISTENTLY INCREASED TROPONIN VALUES IN THE RANGE OF 0.04-0.49 ng/mL CAN BE SEEN IN:       -UNSTABLE ANGINA       -CONGESTIVE HEART FAILURE       -MYOCARDITIS       -CHEST TRAUMA       -ARRYHTHMIAS       -LATE PRESENTING MYOCARDIAL INFARCTION       -COPD   CLINICAL FOLLOW-UP RECOMMENDED.   TSH     Status: None   Collection Time: 02/19/15  6:00 PM  Result Value Ref Range   TSH 2.463 0.350 - 4.500 uIU/mL  Troponin I (q 6hr x 3)     Status: Abnormal   Collection Time: 02/19/15 10:12 PM  Result Value Ref Range   Troponin I 0.11 (H) <0.031 ng/mL    Comment:        PERSISTENTLY INCREASED TROPONIN VALUES IN THE RANGE OF 0.04-0.49 ng/mL CAN BE SEEN IN:       -UNSTABLE ANGINA       -CONGESTIVE HEART FAILURE       -MYOCARDITIS       -CHEST TRAUMA        -ARRYHTHMIAS       -LATE PRESENTING MYOCARDIAL INFARCTION       -COPD   CLINICAL FOLLOW-UP RECOMMENDED.   Comprehensive metabolic panel     Status: Abnormal   Collection Time: 02/20/15  3:27 AM  Result Value Ref Range   Sodium 137 135 - 145 mmol/L   Potassium 3.6 3.5 - 5.1 mmol/L   Chloride 102 101 - 111 mmol/L   CO2 25 22 - 32 mmol/L  Glucose, Bld 137 (H) 70 - 99 mg/dL   BUN 16 6 - 20 mg/dL   Creatinine, Ser 0.80 0.44 - 1.00 mg/dL   Calcium 8.0 (L) 8.9 - 10.3 mg/dL   Total Protein 6.4 (L) 6.5 - 8.1 g/dL   Albumin 3.0 (L) 3.5 - 5.0 g/dL   AST 66 (H) 15 - 41 U/L   ALT 52 14 - 54 U/L   Alkaline Phosphatase 114 38 - 126 U/L   Total Bilirubin 1.6 (H) 0.3 - 1.2 mg/dL   GFR calc non Af Amer >60 >60 mL/min   GFR calc Af Amer >60 >60 mL/min    Comment: (NOTE) The eGFR has been calculated using the CKD EPI equation. This calculation has not been validated in all clinical situations. eGFR's persistently <90 mL/min signify possible Chronic Kidney Disease.    Anion gap 10 5 - 15   Dg Chest Port 1 View  02/20/2015   CLINICAL DATA:  Dyspnea.  Hypoxia.  EXAM: PORTABLE CHEST - 1 VIEW  COMPARISON:  02/19/2015  FINDINGS: Vascular and interstitial congestive changes have worsened. There are developing central and medial basilar airspace opacities which may represent alveolar edema. Infectious infiltrates cannot be entirely excluded. There are pleural effusions. The findings likely represent worsening congestive heart failure.  IMPRESSION: Worsening congestive heart failure with developing airspace opacities and pleural effusions.   Electronically Signed   By: Andreas Newport M.D.   On: 02/20/2015 01:01   Dg Chest Port 1 View  02/19/2015   CLINICAL DATA:  Atrial fibrillation.  EXAM: PORTABLE CHEST - 1 VIEW  COMPARISON:  None.  FINDINGS: There is slight interstitial pulmonary edema. There is no airspace consolidation. The heart size and pulmonary vascularity are normal. No adenopathy. There is  thoracolumbar levoscoliosis. Note multiple overlying leads and paddles.  IMPRESSION: Mild interstitial edema. No airspace consolidation. Heart size within normal limits.   Electronically Signed   By: Lowella Grip III M.D.   On: 02/19/2015 09:20    ROS: General:no colds or fevers, no weight changes Skin:no rashes or ulcers HEENT:no blurred vision, no congestion CV:see HPI PUL:see HPI GI:no diarrhea constipation or melena, no indigestion GU:no hematuria, no dysuria MS:no joint pain, no claudication Neuro:no syncope, no lightheadedness Endo:no diabetes, no thyroid disease   Blood pressure 97/59, pulse 98, temperature 98 F (36.7 C), temperature source Oral, resp. rate 27, height 5' 3"  (1.6 m), weight 109 lb 11.2 oz (49.76 kg), SpO2 95 %.  Wt Readings from Last 3 Encounters:  02/20/15 109 lb 11.2 oz (49.76 kg)  09/30/12 107 lb (48.535 kg)    PE: General:Pleasant affect, NAD Skin:Warm and dry, brisk capillary refill HEENT:normocephalic, sclera clear, mucus membranes moist Neck:supple, no JVD, no bruits  Heart:S1S2 RRR with 2-3/6  Murmur heard best Lt of sternum 4th ICS and lateral., no gallup, rub or click Lungs: with rales in bases, no rhonchi, or wheezes NUU:VOZD, non tender, + BS, do not palpate liver spleen or masses Ext:no to tr lower ext edema, 2+ pedal pulses, 2+ radial pulses Neuro:alert and oriented X 3, MAE, follows commands, + facial symmetry    Assessment/Plan Principal Problem:   Atrial fibrillation with RVR- cardioversion yesterday to SR back in tachycardia with RVR but regular this AM -atrial tach. Treated with IV BB at 5 mg. possible accelerated Junct.- now in [place] with PACs at times.  awaiting echo to know EF for further treatment.   Active Problems:  anticoagulation Her CHADS-VASc score is 4 now on eliquis  Hypotension- improved but difficult to add meds.  Elevated troponin, could be demand ischemia with hypotension, but should have nuc study once rate  controlled.   Mitral Murmur, hx of this as well, awaiting echo    GERD (gastroesophageal reflux disease)    HGDJME,QASTM R  Nurse Practitioner Certified [place] Pager [phone] or after 5pm or weekends call [phone] 02/20/2015, 4:01 PM     I have seen and examined the patient along with INGOLD,LAURA R  NPC.  I have reviewed the chart, notes and new data.  I agree with NP's note.  Key new complaints: feels weak, but not dyspneic Key examination changes: very loud holosystolic MR murmur with systolic heave, radiating deep into the axilla Key new findings / data: ECG c/w paroxysmal atrial tachycardia with long AV conduction time versus atypical (left atrial?) flutter; marginal increase in troponin in "plateau" pattern, not consistent with ACS  PLAN: The focus is on symptom relief. She clearly does not wish aggressive intervention and expresses a wish for DNR status, that her family agrees with. Few choices for arrhythmia control due to low BP. Will use amiodarone, conservative dosage due to her small body habitus. Echo pending. Suspect severe MR. I do not think she needs any more diuretics right now. CHF precipitated by tachyarrhythmia, no overt fluid overload.  Sanda Klein, MD, [place] [phone] 02/20/2015, 7:09 PM

## 2015-02-20 NOTE — Progress Notes (Signed)
Pt c/o persistent dry cough, unable to rest, and feeling "weak and tired."  Appears SOB with exertion and states she is "unable to take a deep breath" and "feels tight in her chest."  EKG shows NSR.  VSS except O2 sat 86% on 2L/[place].  Lungs with rales in mid and lower lobes bilaterally.  O2 increased to 6L/[place]; sats improved to 93%.  Dr. Raelene Bott notified, and states he is coming to assess pt.  Will continue to monitor.  Jodell Cipro

## 2015-02-20 NOTE — Progress Notes (Signed)
  Date: 02/20/2015  Patient name: Christina Buckley  Medical record number: [id number]  Date of birth: May 28, 1924   I have seen and evaluated Christina Buckley and discussed their care with the Residency Team. [name] is a 81 yo female pt of Dr Lisbeth Ply. 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 Her husband died a little over a year ago of kidney failure complications. She presented with one week of not being able to take a deep breath and feeling week and 1 day of palpitations. She was found to be in A Fib with RVR (new dx) and cardioverted in ED. This AM, she is in sinus tach but per RN, HR increases briefly to 140's. Today she feels week bc not able to sleep last PM 2/2 pul edema, coughing, and lasix induced urinary freq. Now no cough, no dyspnea.   She lives alone, is indep in her ADL's, but has 4 kids nearby. Recently working in garden. Mentally sharp.  Filed Vitals:   02/20/15 0422  BP: 117/84  Pulse: 99  Temp: 98.2 F (36.8 C)  Resp:    Gen sitting SOB, breathing comfortably.  3:30 PM after pt bc tachy and re-examined by Dr Ronnald Ramp Resting in bed. No Resp distress. Radial pulse regular with occ atopic beat. Rate 90's after metoprolol. Ausculation reg rate but arrhythmia.   Assessment and Plan: I have seen and evaluated the patient as outlined above. I agree with the formulated Assessment and Plan as detailed in the residents' admission note, with the following changes:   1. A Fib with RVR - s/p cardioversion. Seems to be in sinus tachy but unknown rhythm when 140's. Trigger uncertain - TSH nl, no new meds, EKG shows no ischemia (post CV showed some sinus arrhythmia), Trop mildly elevated, no infxn. ECHO is pending (assess LV FXN and wall motion). Due to CHADs score 4, pt now on DOAC. Follow rate and rhythm today and ECHO results. If stable, outpt cards F/U. If not, inpt cards.  2. Transient hypoxia - CXR suggested pul edema as cause and fits with recent  IVF bolus and impaired LV fxn from A Fib. Pt responded well to Lasix. Check ECHO and follow clinically.   3. Global weakness - PT eval  Bartholomew Crews, MD 5/2/201610:40 AM

## 2015-02-20 NOTE — Progress Notes (Signed)
Subjective: Patient had increased cough and oxygen supplementation requirements overnight. CXR showed pulmonary edema. She was given Lasix 20 mg by the night team which improved her symptoms. She states she is feeling better this morning and that her breathing is better. She does note she has a hard time taking a deep breath which is new for her over the past few days. She denies any pain. Nurse reports that overnight she had a few episodes of where her HR would jump to the 140s for a few seconds and then return to low 100s. She is currently in sinus tachycardia.   Objective: Vital signs in last 24 hours: Filed Vitals:   02/20/15 0019 02/20/15 0025 02/20/15 0030 02/20/15 0422  BP: 120/73   117/84  Pulse: 94   99  Temp: 98.5 F (36.9 C)   98.2 F (36.8 C)  TempSrc: Oral   Oral  Resp: 20     Height:      Weight:    109 lb 11.2 oz (49.76 kg)  SpO2: 86% 90% 93% 94%   Weight change:   Intake/Output Summary (Last 24 hours) at 02/20/15 0937 Last data filed at 02/20/15 0426  Gross per 24 hour  Intake      0 ml  Output   1070 ml  Net  -1070 ml   Physical Exam General: sitting up in bed, pleasant, NAD  HEENT: [place]/AT, EOMI, sclera anicteric, mucus membranes moist CV: tachycardic in 841L, 3/6 systolic murmur heard best at LUSB Pulm: minimal crackles at bases, breaths non-labored Abd: BS+, soft, non-tender Ext: warm, trace-1+ pitting edema in lower extremities  Neuro: alert and oriented x 3, no focal deficits  Lab Results: Basic Metabolic Panel:  Recent Labs Lab 02/19/15 0840 02/19/15 0900 02/20/15 0327  NA 138 139 137  K 3.7 3.7 3.6  CL 108 105 102  CO2 23  --  25  GLUCOSE 140* 140* 137*  BUN 19 19 16   CREATININE 0.83 0.70 0.80  CALCIUM 7.7*  --  8.0*   Liver Function Tests:  Recent Labs Lab 02/19/15 0840 02/20/15 0327  AST 65* 66*  ALT 34 52  ALKPHOS 97 114  BILITOT 1.4* 1.6*  PROT 5.7* 6.4*  ALBUMIN 2.8* 3.0*   CBC:  Recent Labs Lab 02/19/15 0840  02/19/15 0900  WBC 6.7  --   HGB 12.0 11.9*  HCT 35.7* 35.0*  MCV 96.7  --   PLT 152  --    Cardiac Enzymes:  Recent Labs Lab 02/19/15 1119 02/19/15 1748 02/19/15 2212  TROPONINI 0.06* 0.13* 0.11*   CBG:  Recent Labs Lab 02/19/15 0849  GLUCAP 124*   Thyroid Function Tests:  Recent Labs Lab 02/19/15 1800  TSH 2.463   Coagulation:  Recent Labs Lab 02/19/15 0840  LABPROT 16.0*  INR 1.26   Studies/Results: Dg Chest Port 1 View  02/20/2015   CLINICAL DATA:  Dyspnea.  Hypoxia.  EXAM: PORTABLE CHEST - 1 VIEW  COMPARISON:  02/19/2015  FINDINGS: Vascular and interstitial congestive changes have worsened. There are developing central and medial basilar airspace opacities which may represent alveolar edema. Infectious infiltrates cannot be entirely excluded. There are pleural effusions. The findings likely represent worsening congestive heart failure.  IMPRESSION: Worsening congestive heart failure with developing airspace opacities and pleural effusions.   Electronically Signed   By: Andreas Newport M.D.   On: 02/20/2015 01:01   Dg Chest Port 1 View  02/19/2015   CLINICAL DATA:  Atrial fibrillation.  EXAM:  PORTABLE CHEST - 1 VIEW  COMPARISON:  None.  FINDINGS: There is slight interstitial pulmonary edema. There is no airspace consolidation. The heart size and pulmonary vascularity are normal. No adenopathy. There is thoracolumbar levoscoliosis. Note multiple overlying leads and paddles.  IMPRESSION: Mild interstitial edema. No airspace consolidation. Heart size within normal limits.   Electronically Signed   By: Lowella Grip III M.D.   On: 02/19/2015 09:20   Medications: I have reviewed the patient's current medications.  Assessment/Plan:  AFib with RVR s/p Cardioversion: Patient now in sinus tachycardia in high 90s-low 100s. Review of telemetry shows normal sinus rhythm. Troponins minimally elevated up to 0.13, likely demand ischemia. Not concerned for ACS given EKG with  no ischemic changes and no chest pain. She had some dyspnea and cough last night that was due to pulmonary edema. Awaiting echo to evaluate for structural/valve disease. Patient wishes to follow up with Dr. Sallyanne Kuster (cardiology) as this was her husband's cardiologist.  - Continue Eliquis 2.5 mg BID - f/u 2D Echo  - TSH normal  - Oxygen therapy as needed  - Make appt with Dr. Victorino December office   Hypotension- Improved: BPs now stable in 101B-510C systolic. Possibly related to taking family member's metoprolol yesterday and acute AFib.   - Continue to monitor   Elevated Glucose: Glucose 140 on admission, 137 on cmet today. Patient recently took Prednisone 60 mg x 1 (on 4/29) for knee pain. Likely related to her recent steroid use and acute stress from AFib. - Continue to monitor   Elevated LFTs: AST 65, TBili 1.4 on admission. Repeat today shows AST 66, TBili 1.6. Patient does not have any abdominal symptoms and no tenderness on exam. Patient denies any alcohol use. Will have her follow up with her PCP.   GERD: Patient takes Prilosec 20 mg daily. - Continue home Prilosec   Likely Osteopenia: Presumed diagnosis since patient is taking Fosamax 70 mg Qweekly. She is also on calcium and vitamin D supplementation.  - Continue home multivitamin  - PT/OT consult   Scoliosis: Patient reported and noted on CXR. Patient takes Norco 5-325 mg Q4H PRN pain at home.  - Hold home pain meds for now given normotensive   Diet: Regular  VTE: Eliquis  Dispo: Discharge possibly today or tomorrow   The patient does have a current PCP (Leonides Sake, MD) and does need an [facility] hospital follow-up appointment after discharge.  The patient does not have transportation limitations that hinder transportation to clinic appointments.  .Services Needed at time of discharge: Y = Yes, Blank = No PT:   OT:   RN:   Equipment:   Other:       Juliet Rude, MD 02/20/2015, 9:37 AM

## 2015-02-20 NOTE — Progress Notes (Addendum)
IMTS Night Float Progress Note  S: Patient reporting increased cough and now requiring increased oxygen supplementation. Patient underwent cardioversion for atrial fibrillation earlier with no significant reversion to atrial fibrillation on telemetry. Patient previously with low blood pressures but now recovered. Patient reports only a history of mitral valve prolapse in the past.  O:  Filed Vitals:   02/20/15 0019  BP: 120/73  Pulse: 94  Temp: 98.5 F (36.9 C)  Resp: 20   General: resting in bed, in no acute distress Cardiac: RRR, no rubs, murmurs or gallops Pulm: Bilateral rales to the midlung fields  A/P: Patient status post cardioversion now in normal sinus rhythm with increasing oxygen requirement (now requiring 5 L nasal cannula) and cough secondary to pulmonary edema. Chest x-ray showing interstitial edema. Renal function is normal. Patient is status post 1 L of normal saline bolus. -Continue oxygen supplementation -Lasix 20 mg once -Strict ins and outs -Echocardiogram planned for the morning -Robitussin-AC for cough  Luan Moore, MD, PhD 02/20/15 12:53 AM

## 2015-02-20 NOTE — Progress Notes (Signed)
Pt with HR sustaining in 130's, occasionally increasing to 140's. BP soft in the 90s. 12 lead EKG obtained, showed accelerated junctional. Per CCMD, pt appears accelerated junctional vs. afib at times. Dr. Heber [place] paged and made aware. No new orders received. Will continue to monitor closely.  Addendum: Dr. Heber [place] called back, stated he will another resident come up and evaluate patient. They will also be making an official cards consult. Waiting further orders. Will continue to monitor patient closely.

## 2015-02-21 ENCOUNTER — Observation Stay (HOSPITAL_COMMUNITY): Payer: Medicare Other

## 2015-02-21 DIAGNOSIS — M25561 Pain in right knee: Secondary | ICD-10-CM | POA: Diagnosis present

## 2015-02-21 DIAGNOSIS — K219 Gastro-esophageal reflux disease without esophagitis: Secondary | ICD-10-CM | POA: Diagnosis present

## 2015-02-21 DIAGNOSIS — I5043 Acute on chronic combined systolic (congestive) and diastolic (congestive) heart failure: Secondary | ICD-10-CM | POA: Diagnosis not present

## 2015-02-21 DIAGNOSIS — I509 Heart failure, unspecified: Secondary | ICD-10-CM | POA: Diagnosis not present

## 2015-02-21 DIAGNOSIS — T447X1A Poisoning by beta-adrenoreceptor antagonists, accidental (unintentional), initial encounter: Secondary | ICD-10-CM | POA: Diagnosis present

## 2015-02-21 DIAGNOSIS — Z7983 Long term (current) use of bisphosphonates: Secondary | ICD-10-CM | POA: Diagnosis not present

## 2015-02-21 DIAGNOSIS — I272 Other secondary pulmonary hypertension: Secondary | ICD-10-CM | POA: Diagnosis not present

## 2015-02-21 DIAGNOSIS — I952 Hypotension due to drugs: Secondary | ICD-10-CM | POA: Diagnosis present

## 2015-02-21 DIAGNOSIS — T501X5A Adverse effect of loop [high-ceiling] diuretics, initial encounter: Secondary | ICD-10-CM | POA: Diagnosis not present

## 2015-02-21 DIAGNOSIS — I471 Supraventricular tachycardia: Secondary | ICD-10-CM | POA: Diagnosis not present

## 2015-02-21 DIAGNOSIS — M419 Scoliosis, unspecified: Secondary | ICD-10-CM | POA: Diagnosis present

## 2015-02-21 DIAGNOSIS — M810 Age-related osteoporosis without current pathological fracture: Secondary | ICD-10-CM | POA: Diagnosis present

## 2015-02-21 DIAGNOSIS — I4891 Unspecified atrial fibrillation: Secondary | ICD-10-CM | POA: Diagnosis not present

## 2015-02-21 DIAGNOSIS — I340 Nonrheumatic mitral (valve) insufficiency: Secondary | ICD-10-CM | POA: Diagnosis not present

## 2015-02-21 DIAGNOSIS — I9589 Other hypotension: Secondary | ICD-10-CM | POA: Diagnosis not present

## 2015-02-21 DIAGNOSIS — I5040 Unspecified combined systolic (congestive) and diastolic (congestive) heart failure: Secondary | ICD-10-CM | POA: Diagnosis not present

## 2015-02-21 DIAGNOSIS — Z8249 Family history of ischemic heart disease and other diseases of the circulatory system: Secondary | ICD-10-CM | POA: Diagnosis not present

## 2015-02-21 DIAGNOSIS — R7302 Impaired glucose tolerance (oral): Secondary | ICD-10-CM | POA: Diagnosis not present

## 2015-02-21 DIAGNOSIS — I341 Nonrheumatic mitral (valve) prolapse: Secondary | ICD-10-CM | POA: Diagnosis not present

## 2015-02-21 DIAGNOSIS — R7989 Other specified abnormal findings of blood chemistry: Secondary | ICD-10-CM | POA: Diagnosis present

## 2015-02-21 DIAGNOSIS — R002 Palpitations: Secondary | ICD-10-CM | POA: Diagnosis present

## 2015-02-21 DIAGNOSIS — I10 Essential (primary) hypertension: Secondary | ICD-10-CM | POA: Diagnosis present

## 2015-02-21 DIAGNOSIS — R0902 Hypoxemia: Secondary | ICD-10-CM | POA: Diagnosis not present

## 2015-02-21 DIAGNOSIS — Z66 Do not resuscitate: Secondary | ICD-10-CM | POA: Diagnosis present

## 2015-02-21 DIAGNOSIS — E876 Hypokalemia: Secondary | ICD-10-CM | POA: Diagnosis not present

## 2015-02-21 DIAGNOSIS — Z7982 Long term (current) use of aspirin: Secondary | ICD-10-CM | POA: Diagnosis not present

## 2015-02-21 DIAGNOSIS — Z9889 Other specified postprocedural states: Secondary | ICD-10-CM | POA: Diagnosis not present

## 2015-02-21 DIAGNOSIS — I248 Other forms of acute ischemic heart disease: Secondary | ICD-10-CM | POA: Diagnosis not present

## 2015-02-21 LAB — URINE MICROSCOPIC-ADD ON

## 2015-02-21 LAB — URINALYSIS, ROUTINE W REFLEX MICROSCOPIC
Bilirubin Urine: NEGATIVE
Glucose, UA: NEGATIVE mg/dL
Ketones, ur: NEGATIVE mg/dL
Nitrite: NEGATIVE
PROTEIN: NEGATIVE mg/dL
Specific Gravity, Urine: 1.015 (ref 1.005–1.030)
UROBILINOGEN UA: 0.2 mg/dL (ref 0.0–1.0)
pH: 5.5 (ref 5.0–8.0)

## 2015-02-21 MED ORDER — ONDANSETRON HCL 4 MG/2ML IJ SOLN
4.0000 mg | Freq: Once | INTRAMUSCULAR | Status: AC
  Administered 2015-02-21: 4 mg via INTRAVENOUS

## 2015-02-21 MED ORDER — METOPROLOL TARTRATE 1 MG/ML IV SOLN
5.0000 mg | Freq: Once | INTRAVENOUS | Status: AC
  Administered 2015-02-21: 5 mg via INTRAVENOUS
  Filled 2015-02-21: qty 5

## 2015-02-21 MED ORDER — AMIODARONE HCL IN DEXTROSE 360-4.14 MG/200ML-% IV SOLN
60.0000 mg/h | INTRAVENOUS | Status: AC
  Administered 2015-02-21: 60 mg/h via INTRAVENOUS
  Administered 2015-02-21: 150 mg/h via INTRAVENOUS
  Filled 2015-02-21: qty 200

## 2015-02-21 MED ORDER — ONDANSETRON HCL 4 MG/2ML IJ SOLN
INTRAMUSCULAR | Status: AC
  Filled 2015-02-21: qty 2

## 2015-02-21 MED ORDER — AMIODARONE HCL IN DEXTROSE 360-4.14 MG/200ML-% IV SOLN
30.0000 mg/h | INTRAVENOUS | Status: DC
  Administered 2015-02-21 (×2): 30 mg/h via INTRAVENOUS
  Filled 2015-02-21 (×3): qty 200

## 2015-02-21 MED ORDER — AMIODARONE HCL 200 MG PO TABS: 200.0000 mg | ORAL_TABLET | Freq: Two times a day (BID) | ORAL | Status: DC

## 2015-02-21 MED ORDER — AMIODARONE LOAD VIA INFUSION
150.0000 mg | Freq: Once | INTRAVENOUS | Status: AC
  Administered 2015-02-21: 150 mg via INTRAVENOUS
  Filled 2015-02-21: qty 83.34

## 2015-02-21 NOTE — Progress Notes (Signed)
PT Cancellation Note  Patient Details Name: Christina Buckley MRN: [id number] DOB: [birth year]   Cancelled Treatment:    Reason Eval/Treat Not Completed: Fatigue/lethargy limiting ability to participate; patient reports feeling weaker today and coughing overnight.  Reports still getting up to bathroom with nursing staff.  Will attempt to see tomorrow.   Lowery Paullin,CYNDI 02/21/2015, 2:20 PM  Magda Kiel, [place] 02/21/2015\

## 2015-02-21 NOTE — Progress Notes (Signed)
Pt c/o nausea after using BSC. MD on call paged and new order received for Zofran.

## 2015-02-21 NOTE — Progress Notes (Signed)
Subjective: Yesterday, patient's HR was jumping up to 140s periodically and then returning to 80-90s. Appears she is going in and out of atrial tachycardia/AFib. Cardiology was asked to evaluate her and plan to start amio given her low BP. Her telemetry this morning is consistent with occasional atrial tachycardia. She states she feels her breathing is better this morning. Denies any chest pain.   Objective: Vital signs in last 24 hours: Filed Vitals:   02/20/15 1515 02/20/15 1605 02/20/15 2005 02/21/15 0415  BP: 97/59 102/52 96/61 94/56   Pulse: 98 80 95 92  Temp:   98.8 F (37.1 C) 98 F (36.7 C)  TempSrc:   Oral Oral  Resp: 27 31 23 20   Height:      Weight:    107 lb 6.4 oz (48.716 kg)  SpO2: 95% 93% 92% 98%   Weight change: 2 lb 6.4 oz (1.089 kg)  Intake/Output Summary (Last 24 hours) at 02/21/15 0810 Last data filed at 02/21/15 0923  Gross per 24 hour  Intake    460 ml  Output    825 ml  Net   -365 ml   Physical Exam General: elderly frail woman sitting up in bed, pleasant, NAD HEENT: [place]/AT, EOMI, mucus membranes moist CV: irregularly irregular, tachycardic, 3/6 systolic murmur heard best at LUSB Pulm: CTA bilaterally, unable to take deep breaths but breaths non-labored Abd: BS+, soft, non-tender Ext: warm, no edema Neuro: alert and oriented x 3  Lab Results: Basic Metabolic Panel:  Recent Labs Lab 02/19/15 0840 02/19/15 0900 02/20/15 0327  NA 138 139 137  K 3.7 3.7 3.6  CL 108 105 102  CO2 23  --  25  GLUCOSE 140* 140* 137*  BUN 19 19 16   CREATININE 0.83 0.70 0.80  CALCIUM 7.7*  --  8.0*   Liver Function Tests:  Recent Labs Lab 02/19/15 0840 02/20/15 0327  AST 65* 66*  ALT 34 52  ALKPHOS 97 114  BILITOT 1.4* 1.6*  PROT 5.7* 6.4*  ALBUMIN 2.8* 3.0*   CBC:  Recent Labs Lab 02/19/15 0840 02/19/15 0900  WBC 6.7  --   HGB 12.0 11.9*  HCT 35.7* 35.0*  MCV 96.7  --   PLT 152  --    Cardiac Enzymes:  Recent Labs Lab 02/19/15 1119  02/19/15 1748 02/19/15 2212  TROPONINI 0.06* 0.13* 0.11*   CBG:  Recent Labs Lab 02/19/15 0849  GLUCAP 124*   Thyroid Function Tests:  Recent Labs Lab 02/19/15 1800  TSH 2.463   Coagulation:  Recent Labs Lab 02/19/15 0840  LABPROT 16.0*  INR 1.26   Studies/Results: Dg Chest Port 1 View  02/20/2015   CLINICAL DATA:  Dyspnea.  Hypoxia.  EXAM: PORTABLE CHEST - 1 VIEW  COMPARISON:  02/19/2015  FINDINGS: Vascular and interstitial congestive changes have worsened. There are developing central and medial basilar airspace opacities which may represent alveolar edema. Infectious infiltrates cannot be entirely excluded. There are pleural effusions. The findings likely represent worsening congestive heart failure.  IMPRESSION: Worsening congestive heart failure with developing airspace opacities and pleural effusions.   Electronically Signed   By: Andreas Newport M.D.   On: 02/20/2015 01:01   Dg Chest Port 1 View  02/19/2015   CLINICAL DATA:  Atrial fibrillation.  EXAM: PORTABLE CHEST - 1 VIEW  COMPARISON:  None.  FINDINGS: There is slight interstitial pulmonary edema. There is no airspace consolidation. The heart size and pulmonary vascularity are normal. No adenopathy. There is thoracolumbar levoscoliosis. Note  multiple overlying leads and paddles.  IMPRESSION: Mild interstitial edema. No airspace consolidation. Heart size within normal limits.   Electronically Signed   By: Lowella Grip III M.D.   On: 02/19/2015 09:20   Medications: I have reviewed the patient's current medications.  Assessment/Plan:  AFib with RVR s/p Cardioversion: Patient now jumping in and out of atrial tachycardia. Cardiology plans to start Amio given her low BPs in 46-27O systolic. Awaiting 2D Echo.  - Cardiology following, appreciate recommendations - Continue Eliquis 2.5 mg BID - f/u 2D Echo  - Start Amio today, per cards  - Oxygen therapy as needed  - f/u appt with Dr. Victorino December office    Hypotension: BPs in 35-00X systolic.  - Continue to monitor   Elevated Glucose: Glucose in 140s. Patient recently took Prednisone 60 mg x 1 (on 4/29) for knee pain. Likely related to her recent steroid use and acute stress from arrhythmia. - Continue to monitor   Elevated LFTs: AST 65, TBili 1.4 on admission. Repeat today shows AST 66, TBili 1.6. Patient does not have any abdominal symptoms and no tenderness on exam. Patient denies any alcohol use. Will have her follow up with her PCP.   GERD: Patient takes Prilosec 20 mg daily. - Continue home Prilosec   Likely Osteopenia: Presumed diagnosis since patient is taking Fosamax 70 mg Qweekly. She is also on calcium and vitamin D supplementation.  - Continue home multivitamin  - PT/OT consult>> no PT needs at this time  Scoliosis: Patient reported and noted on CXR. Patient takes Norco 5-325 mg Q4H PRN pain at home.  - Hold home pain meds for now given hypotension   Diet: Regular  VTE PPx: Eliquis  Dispo: Awaiting echocardiogram. Likely d/c today or tomorrow if echo completed.   The patient does have a current PCP (Leonides Sake, MD) and does need an [facility] hospital follow-up appointment after discharge.  The patient does not have transportation limitations that hinder transportation to clinic appointments.  .Services Needed at time of discharge: Y = Yes, Blank = No PT:   OT:   RN:   Equipment:   Other:       Juliet Rude, MD 02/21/2015, 8:10 AM

## 2015-02-21 NOTE — Progress Notes (Signed)
Patient Name: Christina Buckley Date of Encounter: 02/21/2015  Principal Problem:   Atrial fibrillation with RVR Active Problems:   Hypotension   GERD (gastroesophageal reflux disease)   Length of Stay:   SUBJECTIVE  Coughing, dyspneic. Increasing frequency and duration of episodes of atrial tachycardia, some possibly representing atrial fibrillation. BP remains borderline low. Echo pending.  CURRENT MEDS . amiodarone  150 mg Intravenous Once  . apixaban  2.5 mg Oral Q12H  . metoprolol  5 mg Intravenous Once  . multivitamin with minerals  1 tablet Oral Daily  . ondansetron      . pantoprazole  40 mg Oral Daily  . sodium chloride  500 mL Intravenous Once  . sodium chloride  3 mL Intravenous Q12H    OBJECTIVE   Intake/Output Summary (Last 24 hours) at 02/21/15 0918 Last data filed at 02/21/15 6767  Gross per 24 hour  Intake    460 ml  Output    825 ml  Net   -365 ml   Filed Weights   02/19/15 0824 02/20/15 0422 02/21/15 0415  Weight: 105 lb (47.628 kg) 109 lb 11.2 oz (49.76 kg) 107 lb 6.4 oz (48.716 kg)    PHYSICAL EXAM Filed Vitals:   02/20/15 2005 02/21/15 0415 02/21/15 0705 02/21/15 0850  BP: 96/61 94/56 92/60  83/50  Pulse: 95 92 101 139  Temp: 98.8 F (37.1 C) 98 F (36.7 C)    TempSrc: Oral Oral    Resp: 23 20 17 26   Height:      Weight:  107 lb 6.4 oz (48.716 kg)    SpO2: 92% 98% 94% 97%   General: Alert, oriented x3, no distress Head: no evidence of trauma, PERRL, EOMI, no exophtalmos or lid lag, no myxedema, no xanthelasma; normal ears, nose and oropharynx Neck: normal jugular venous pulsations and no hepatojugular reflux; brisk carotid pulses without delay and no carotid bruits Chest: prominent kyphoscoliosis, absent breath sounds left base, no signs of consolidation by percussion or palpation, normal fremitus, symmetrical and full respiratory excursions Cardiovascular: normal position and quality of the apical impulse, regular rhythm, normal first  and second heart sounds, no rubs or gallops, 3/6 apical holosystolic murmur Abdomen: no tenderness or distention, no masses by palpation, no abnormal pulsatility or arterial bruits, normal bowel sounds, no hepatosplenomegaly Extremities: no clubbing, cyanosis or edema; 2+ radial, ulnar and brachial pulses bilaterally; 2+ right femoral, posterior tibial and dorsalis pedis pulses; 2+ left femoral, posterior tibial and dorsalis pedis pulses; no subclavian or femoral bruits Neurological: grossly nonfocal  LABS  CBC  Recent Labs  02/19/15 0840 02/19/15 0900  WBC 6.7  --   HGB 12.0 11.9*  HCT 35.7* 35.0*  MCV 96.7  --   PLT 152  --    Basic Metabolic Panel  Recent Labs  02/19/15 0840 02/19/15 0900 02/20/15 0327  NA 138 139 137  K 3.7 3.7 3.6  CL 108 105 102  CO2 23  --  25  GLUCOSE 140* 140* 137*  BUN 19 19 16   CREATININE 0.83 0.70 0.80  CALCIUM 7.7*  --  8.0*   Liver Function Tests  Recent Labs  02/19/15 0840 02/20/15 0327  AST 65* 66*  ALT 34 52  ALKPHOS 97 114  BILITOT 1.4* 1.6*  PROT 5.7* 6.4*  ALBUMIN 2.8* 3.0*   No results for input(s): LIPASE, AMYLASE in the last 72 hours. Cardiac Enzymes  Recent Labs  02/19/15 1119 02/19/15 1748 02/19/15 2212  TROPONINI 0.06* 0.13* 0.11*  Thyroid Function Tests  Recent Labs  02/19/15 1800  TSH 2.463    Radiology Studies Imaging results have been reviewed and Dg Chest Port 1 View  02/20/2015   CLINICAL DATA:  Dyspnea.  Hypoxia.  EXAM: PORTABLE CHEST - 1 VIEW  COMPARISON:  02/19/2015  FINDINGS: Vascular and interstitial congestive changes have worsened. There are developing central and medial basilar airspace opacities which may represent alveolar edema. Infectious infiltrates cannot be entirely excluded. There are pleural effusions. The findings likely represent worsening congestive heart failure.  IMPRESSION: Worsening congestive heart failure with developing airspace opacities and pleural effusions.    Electronically Signed   By: Andreas Newport M.D.   On: 02/20/2015 01:01    TELE Frequent/incessant episodes of PAT and probably PAF   ASSESSMENT AND PLAN  Start IV amiodarone. One dose of beta blocker now. Trying to avoid additional diuretics due to low BP - I think CHF is function of arrhythmia (and what I suspect may be severe MR) and most useful intervention will be arrhythmia control.   Sanda Klein, MD, [facility] [phone] office [phone] pager 02/21/2015 9:18 AM

## 2015-02-21 NOTE — Progress Notes (Signed)
  Echocardiogram 2D Echocardiogram has been performed.  Christina Buckley 02/21/2015, 2:02 PM

## 2015-02-21 NOTE — Care Management Note (Signed)
Case Management Note  Patient Details  Name: Christina Buckley MRN: [id number] Date of Birth: 1924/03/20  Subjective/Objective:       Pt admitted with A fib and RVR             Action/Plan:  Pt will begin Eliquis.  CM will provide medication assistance detailed below under comments.  CM will continue to monitor for disposition needs   Expected Discharge Date:  02/20/15               Expected Discharge Plan:  Home/Self Care  In-House Referral:     Discharge planning Services  CM Consult, Medication Assistance  Post Acute Care Choice:    Choice offered to:     DME Arranged:    DME Agency:     HH Arranged:    [place] Agency:     Status of Service:     Medicare Important Message Given:    Date Medicare IM Given:    Medicare IM give by:    Date Additional Medicare IM Given:    Additional Medicare Important Message give by:     If discussed at [place] of Stay Meetings, dates discussed:    Additional Comments:   02/21/15 CM requested benefit check for Eliquis as ordered.  Per benefit check; 02/21/2015 1105 by ANDRA' ROBINSON--- 02/21/2015 Called [employer] at phone number [phone]. Talked to [facility] (Ref # [id number]). ELIQUIS is covered. PRIOR AUTHOIZATION is required. Prior Authorization phone line is [phone]. Patient's retail pharmacy co-payment will be $35.00. Preferred retail pharmacies are [facility], [place], and [facility]. [facility].  CM provided free 30 day trial card to patient for Eliquis and informed both pt and son that the monthly copay will be $35.  CM spoke with pt; preferred [facility] [place], as its the only pharmacy in her town.  [facility] [place] and was informed that Eliquis 2.5mg  BID is currently available for pickup. Per pt; she has walkers and bedside commode at home, and per pt and son they do not feel that [facility] will be needed due to strong family support.  PT assessed pt on 02/20/15; no follow up was documented.  CM asked bedside nurse  to request additional PT/OT evaluation prior to discharge due to current deconditioned state.  Bedside nurse acknowledged CM concerns; new PT/OT orders will be requested once heart rate is better controlled.  CM followed up with MD Rivett to inform of prior authorization, sticky note left on summary tab, and to communicate concern regarding possible HH post discharge.   No additional CM needs at this time.     Maryclare Labrador, RN 02/21/2015, 10:33 AM

## 2015-02-21 NOTE — Progress Notes (Signed)
  Date: 02/21/2015  Patient name: Christina Buckley  Medical record number: [id number]  Date of birth: December 07, 1923   This patient has been seen and the plan of care was discussed with the house staff. Please see their note for complete details. I concur with their findings with the following additions/corrections: Pt is lying in bed. Son at bedside. Feels weaker today than yesterday. No cough, dyspnea. C/O R knee pain. HR per bedside tele 90 - 140's. Lungs no crackles in bases. R knee - no effusion, erythema, + warmth but has heat pack on it. Appreciate Cards assistance. For Ami today for rate control. ECHO. Cont DOAC.  Bartholomew Crews, MD 02/21/2015, 10:18 AM

## 2015-02-21 NOTE — Evaluation (Addendum)
Occupational Therapy Evaluation Patient Details Name: Christina Buckley MRN: [id number] DOB: [birth year] Today's Date: 02/21/2015    History of Present Illness Christina Buckley is a 81 y.o. woman with PMHx of GERD, osteoporosis, and scoliosis who presents to the ED with shortness of breath and palpitations found to be in atrial fibrillation with RVR with HR in 160s.   Clinical Impression   Pt admitted with above. Pt independent with ADLs, PTA. Feel pt will benefit from acute OT to increase independence and activity tolerance prior to d/c. Pt with drop in O2 sats during session (see vital section below).    Follow Up Recommendations  No OT follow up;Supervision - Intermittent    Equipment Recommendations  None recommended by OT    Recommendations for Other Services       Precautions / Restrictions Precautions Precautions: Fall Precaution Comments: reliant on walker at home, new O2 dependency Restrictions Weight Bearing Restrictions: No      Mobility Bed Mobility Overal bed mobility: Needs Assistance Bed Mobility: Sit to Supine;Supine to Sit     Supine to sit: Supervision Sit to supine: Supervision   General bed mobility comments: supervision for lines  Transfers Overall transfer level: Needs assistance   Transfers: Sit to/from Stand Sit to Stand: Min guard              Balance  Min guard for ambulation without device.                                          ADL Overall ADL's : Needs assistance/impaired     Grooming: Wash/dry face;Oral care;Standing;Set up;Supervision/safety           Upper Body Dressing : Set up;Supervision/safety;Sitting   Lower Body Dressing: Min guard;Sit to/from stand   Toilet Transfer: Min guard;Stand-pivot;BSC   Toileting- Water quality scientist and Hygiene: Min guard;Sit to/from stand       Functional mobility during ADLs: Min guard (pt holding onto things in room-only ambulated short distance in  room) General ADL Comments: Educated on deep breathing technique and energy conservation. Pt ambulated to sink and performed grooming. Pt able to reach feet.  Briefly discussed tub transfer technique with tub bench. Explained benefit of OT.      Vision  Pt wears glasses for reading and driving.   Perception     Praxis      Pertinent Vitals/Pain Pain Assessment: No/denies pain; Pt initially on around 3.5L of O2 in session and O2 dropping to 80s, so OT bumped O2 up to 4L and O2 still dropping in 80s. Bumped O2 up again to 4.5L and O2 in 80s-90s (in 90s at end of session). Nurse notified of pt now being on 4.5L.     Hand Dominance     Extremity/Trunk Assessment Upper Extremity Assessment Upper Extremity Assessment: Generalized weakness   Lower Extremity Assessment Lower Extremity Assessment: Defer to PT evaluation;Generalized weakness       Communication Communication Communication: No difficulties   Cognition Arousal/Alertness: Awake/alert Behavior During Therapy: WFL for tasks assessed/performed Overall Cognitive Status: Within Functional Limits for tasks assessed                     General Comments       Exercises       Shoulder Instructions      Home Living Family/patient expects to be discharged to:: Private residence Living  Arrangements: Alone Available Help at Discharge: Family (believe family could assist 24/7 if needed) Type of Home: House Home Access: Stairs to enter [employer] of Steps: 4 Entrance Stairs-Rails: Right Home Layout: Two level;Able to live on main level with bedroom/bathroom Alternate Level Stairs-Number of Steps: stays on main   Bathroom Shower/Tub: Tub/shower unit         Home Equipment: Environmental consultant - 2 wheels;Bedside commode;Tub bench          Prior Functioning/Environment Level of Independence: Independent with assistive device(s)        Comments: active going to local grocery, bank, dollar store, church and  post office, son visits every day and daughter lives close    OT Diagnosis: Generalized weakness;Other (comment) (cardiopulmonary status limiting activity)   OT Problem List: Decreased strength;Decreased activity tolerance;Impaired balance (sitting and/or standing);Decreased knowledge of use of DME or AE;Decreased knowledge of precautions;Cardiopulmonary status limiting activity   OT Treatment/Interventions: Self-care/ADL training;Energy conservation;DME and/or AE instruction;Therapeutic exercise;Therapeutic activities;Patient/family education;Balance training    OT Goals(Current goals can be found in the care plan section) Acute Rehab OT Goals Patient Stated Goal: go home OT Goal Formulation: With patient Time For Goal Achievement: 02/28/15 Potential to Achieve Goals: Good ADL Goals Pt Will Perform Grooming: with set-up;standing Pt Will Perform Upper Body Bathing: with set-up;standing;sitting Pt Will Perform Lower Body Bathing: with set-up;sit to/from stand Pt Will Perform Lower Body Dressing: with set-up;sit to/from stand Pt Will Transfer to Toilet: with modified independence;ambulating (with RW) Additional ADL Goal #1: Pt will independently verbalize 3/3 energy conservation techniques.  OT Frequency: Min 2X/week   Barriers to D/C:            Co-evaluation              End of Session Equipment Utilized During Treatment: Gait belt;Oxygen Nurse Communication: Other (comment) (O2 sats and bumped up O2; pulse ox on finger was wet)  Activity Tolerance: Patient limited by fatigue Patient left: in bed;with call bell/phone within reach;with family/visitor present   Time: 6144-3154 OT Time Calculation (min): 19 min Charges:  OT General Charges $OT Visit: 1 Procedure OT Evaluation $Initial OT Evaluation Tier I: 1 Procedure G-Codes: OT G-codes **NOT FOR INPATIENT CLASS** Functional Assessment Tool Used: clinical judgment Functional Limitation: Self care Self Care Current  Status (M0867): At least 1 percent but less than 20 percent impaired, limited or restricted Self Care Goal Status (Y1950): At least 1 percent but less than 20 percent impaired, limited or restricted  Benito Mccreedy OTR/L [phone] 02/21/2015, 3:34 PM

## 2015-02-21 NOTE — Progress Notes (Signed)
UR completed 

## 2015-02-22 DIAGNOSIS — I341 Nonrheumatic mitral (valve) prolapse: Secondary | ICD-10-CM

## 2015-02-22 DIAGNOSIS — I5043 Acute on chronic combined systolic (congestive) and diastolic (congestive) heart failure: Secondary | ICD-10-CM

## 2015-02-22 DIAGNOSIS — I340 Nonrheumatic mitral (valve) insufficiency: Secondary | ICD-10-CM

## 2015-02-22 DIAGNOSIS — M25561 Pain in right knee: Secondary | ICD-10-CM

## 2015-02-22 DIAGNOSIS — R8290 Unspecified abnormal findings in urine: Secondary | ICD-10-CM

## 2015-02-22 LAB — BASIC METABOLIC PANEL
ANION GAP: 11 (ref 5–15)
BUN: 11 mg/dL (ref 6–20)
CALCIUM: 7.6 mg/dL — AB (ref 8.9–10.3)
CO2: 27 mmol/L (ref 22–32)
Chloride: 94 mmol/L — ABNORMAL LOW (ref 101–111)
Creatinine, Ser: 0.7 mg/dL (ref 0.44–1.00)
Glucose, Bld: 117 mg/dL — ABNORMAL HIGH (ref 70–99)
Potassium: 3.1 mmol/L — ABNORMAL LOW (ref 3.5–5.1)
Sodium: 132 mmol/L — ABNORMAL LOW (ref 135–145)

## 2015-02-22 MED ORDER — LISINOPRIL 2.5 MG PO TABS
2.5000 mg | ORAL_TABLET | Freq: Every day | ORAL | Status: DC
  Administered 2015-02-23: 2.5 mg via ORAL
  Filled 2015-02-22 (×2): qty 1

## 2015-02-22 MED ORDER — AMIODARONE HCL 200 MG PO TABS
200.0000 mg | ORAL_TABLET | Freq: Two times a day (BID) | ORAL | Status: DC
  Administered 2015-02-22 – 2015-02-23 (×3): 200 mg via ORAL
  Filled 2015-02-22 (×3): qty 1

## 2015-02-22 MED ORDER — FUROSEMIDE 10 MG/ML IJ SOLN
60.0000 mg | Freq: Once | INTRAMUSCULAR | Status: AC
  Administered 2015-02-22: 60 mg via INTRAVENOUS
  Filled 2015-02-22: qty 6

## 2015-02-22 MED ORDER — FUROSEMIDE 10 MG/ML IJ SOLN
40.0000 mg | Freq: Every day | INTRAMUSCULAR | Status: DC
  Filled 2015-02-22: qty 4

## 2015-02-22 MED ORDER — POTASSIUM CHLORIDE CRYS ER 20 MEQ PO TBCR
40.0000 meq | EXTENDED_RELEASE_TABLET | Freq: Once | ORAL | Status: AC
  Administered 2015-02-22: 40 meq via ORAL
  Filled 2015-02-22: qty 2

## 2015-02-22 NOTE — Progress Notes (Signed)
Paged on call Mclean, MD for Cardiology. Upon rounding on Patient, she was complaining of shortness of breath, o2 sat's mid 80's, on 6LNC. BP was 107/57 HR 82. Upon assessment of pts lungs, bases posteriorly had not much air movement, very very diminished. Paged MD for orders. Verbal order for IV Lasix 60 Mg once. Patient diuresing and BP is holding steady. Last BP 113/63.  Patient now on [facility]. Patient states, " I feel so much better! I can breathe now!" Will continue to monitor.

## 2015-02-22 NOTE — Progress Notes (Signed)
Physical Therapy Treatment Patient Details Name: Christina Buckley MRN: [id number] DOB: [birth year] Today's Date: 02/22/2015    History of Present Illness Christina Buckley is a 81yo woman with PMHx of GERD, osteoporosis, and scoliosis who presents to the ED with shortness of breath and palpitations found to be in atrial fibrillation with RVR with HR in 160s    PT Comments    Rx limited today by SOB and fatigue. Gait was in room only but she continues to only need supervision to min guard assist for safety and lines.  Follow Up Recommendations  No PT follow up     Equipment Recommendations  None recommended by PT    Recommendations for Other Services       Precautions / Restrictions Precautions Precautions: Fall Precaution Comments: reliant on walker at home, new O2 dependency Restrictions Weight Bearing Restrictions: No    Mobility  Bed Mobility         Supine to sit: Supervision Sit to supine: Supervision   General bed mobility comments: supervision for lines  Transfers   Equipment used: Rolling walker (2 wheeled) Transfers: Stand Pivot Transfers Sit to Stand: Min guard Stand pivot transfers: Min guard       General transfer comment: due to lines  Ambulation/Gait Ambulation/Gait assistance: Min guard Ambulation Distance (Feet): 35 Feet Assistive device: Rolling walker (2 wheeled) Gait Pattern/deviations: Antalgic;Decreased stride length;Trunk flexed Gait velocity: decreased   General Gait Details: reliant on walker, able to straighten up and stand with less UE support static, but stays flexed during gait with kyphoscoliosis   Stairs            Wheelchair Mobility    Modified Rankin (Stroke Patients Only)       Balance                                    Cognition Arousal/Alertness: Awake/alert Behavior During Therapy: WFL for tasks assessed/performed Overall Cognitive Status: Within Functional Limits for tasks assessed                       Exercises      General Comments        Pertinent Vitals/Pain Pain Assessment: Faces Faces Pain Scale: Hurts even more Pain Location: R knee Pain Descriptors / Indicators: Grimacing Pain Intervention(s): Limited activity within patient's tolerance    Home Living                      Prior Function            PT Goals (current goals can now be found in the care plan section) Progress towards PT goals: Not progressing toward goals - comment (decreased cardiopulmonary status resulting in decreased mobility.)    Frequency  Min 3X/week    PT Plan Current plan remains appropriate    Co-evaluation             End of Session Equipment Utilized During Treatment: Gait belt;Oxygen Activity Tolerance: Patient limited by fatigue;Treatment limited secondary to medical complications (Comment) (SOB)       Time: 0814-4818 PT Time Calculation (min) (ACUTE ONLY): 26 min  Charges:  $Gait Training: 23-37 mins                    G Codes:      Christina Buckley 02/22/2015, 12:02 PM

## 2015-02-22 NOTE — Discharge Instructions (Addendum)
It was a pleasure taking care of you, Ms. Dinse.  - You have a follow up appointment with Dr. Lisbeth Ply on May 10th at 10:15 AM - Please take your new medications as follows    Amiodarone 300 mg twice daily    Lisinopril 2.5 mg daily    Lasix 40 mg in the morning (8 AM) and 40 mg in the evening (4 PM)    Potassium chloride (K-Dur) 40 mEq daily     Eliquis 2.5 mg twice daily      Information on my medicine - ELIQUIS (apixaban)  This medication education was reviewed with me or my healthcare representative as part of my discharge preparation.  The pharmacist that spoke with me during my hospital stay was:  Arty Baumgartner, [facility]  Why was Eliquis prescribed for you? Eliquis was prescribed for you to reduce the risk of a blood clot forming that can cause a stroke if you have a medical condition called atrial fibrillation (a type of irregular heartbeat).  What do You need to know about Eliquis ? Take your Eliquis 2.5 mg TWICE DAILY - one tablet in the morning and one tablet in the evening with or without food. If you have difficulty swallowing the tablet whole please discuss with your pharmacist how to take the medication safely.  Take Eliquis exactly as prescribed by your doctor and DO NOT stop taking Eliquis without talking to the doctor who prescribed the medication.  Stopping may increase your risk of developing a stroke.  Refill your prescription before you run out.  After discharge, you should have regular check-up appointments with your healthcare provider that is prescribing your Eliquis.  In the future your dose may need to be changed if your kidney function or weight changes by a significant amount or as you get older.  What do you do if you miss a dose? If you miss a dose, take it as soon as you remember on the same day and resume taking twice daily.  Do not take more than one dose of ELIQUIS at the same time to make up a missed dose.  Important Safety Information A  possible side effect of Eliquis is bleeding. You should call your healthcare provider right away if you experience any of the following: ? Bleeding from an injury or your nose that does not stop. ? Unusual colored urine (red or dark brown) or unusual colored stools (red or black). ? Unusual bruising for unknown reasons. ? A serious fall or if you hit your head (even if there is no bleeding).  Some medicines may interact with Eliquis and might increase your risk of bleeding or clotting while on Eliquis. To help avoid this, consult your healthcare provider or pharmacist prior to using any new prescription or non-prescription medications, including herbals, vitamins, non-steroidal anti-inflammatory drugs (NSAIDs) and supplements.  This website has more information on Eliquis (apixaban): http://www.eliquis.com/eliquis/home

## 2015-02-22 NOTE — Progress Notes (Signed)
  Date: 02/22/2015  Patient name: Christina Buckley  Medical record number: [id number]  Date of birth: 20-Jan-1924   This patient has been seen and the plan of care was discussed with the house staff. Please see their note for complete details. I concur with their findings with the following additions/corrections: Pt bc dyspneic and desat overnight to mid 80's and was Rx'D lasix 60 IV. She is now back at baseline in regards to her breathing (still feels like cann't take a deep breath). Appears to still have slight increased WOB to me but also appears weak compared to the 2nd (up all night with lasix). IV Amio has slowed HR to 90's - low 100's.   1. F/U cards plans to convert IV Amio to PO 2. F/U HR response 3. PT today - pt plans to walk the loop 4. Trend O2 need  Bartholomew Crews, MD 02/22/2015, 11:14 AM

## 2015-02-22 NOTE — Progress Notes (Signed)
Patient Name: Christina Buckley Date of Encounter: 02/22/2015  Principal Problem:   Atrial fibrillation with RVR Active Problems:   Hypotension   GERD (gastroesophageal reflux disease)   Length of Stay: 1  SUBJECTIVE  Needed diuretics last night for severe dyspnea and coughing: moderately better, but very weak and dyspnea only partly improved. Echo confirms severe MR and also shows mildly depressed LVEF.  CURRENT MEDS . amiodarone  200 mg Oral BID  . apixaban  2.5 mg Oral Q12H  . [START ON 02/23/2015] furosemide  40 mg Intravenous Daily  . lisinopril  2.5 mg Oral Daily  . multivitamin with minerals  1 tablet Oral Daily  . pantoprazole  40 mg Oral Daily  . sodium chloride  500 mL Intravenous Once  . sodium chloride  3 mL Intravenous Q12H    OBJECTIVE   Intake/Output Summary (Last 24 hours) at 02/22/15 1355 Last data filed at 02/22/15 7341  Gross per 24 hour  Intake  179.4 ml  Output   2175 ml  Net -1995.6 ml   Filed Weights   02/20/15 0422 02/21/15 0415 02/22/15 0339  Weight: 109 lb 11.2 oz (49.76 kg) 107 lb 6.4 oz (48.716 kg) 106 lb 12.4 oz (48.432 kg)    PHYSICAL EXAM Filed Vitals:   02/22/15 0404 02/22/15 0700 02/22/15 1126 02/22/15 1308  BP: 113/63   97/52  Pulse: 108     Temp:  97.7 F (36.5 C) 98.5 F (36.9 C)   TempSrc:  Oral Oral   Resp: 30     Height:      Weight:      SpO2: 83%      General: Alert, oriented x3, no distress Head: no evidence of trauma, PERRL, EOMI, no exophtalmos or lid lag, no myxedema, no xanthelasma; normal ears, nose and oropharynx Neck: normal jugular venous pulsations and no hepatojugular reflux; brisk carotid pulses without delay and no carotid bruits Chest: improved breath sounds left base, no rales, , normal fremitus, symmetrical and full respiratory excursions Cardiovascular: left ventricular heave, regular rhythm, normal first and second heart sounds, no rubs or gallops, 3/6 holosystolic apical murmur Abdomen: no tenderness  or distention, no masses by palpation, no abnormal pulsatility or arterial bruits, normal bowel sounds, no hepatosplenomegaly Extremities: no clubbing, cyanosis or edema; 2+ radial, ulnar and brachial pulses bilaterally; 2+ right femoral, posterior tibial and dorsalis pedis pulses; 2+ left femoral, posterior tibial and dorsalis pedis pulses; no subclavian or femoral bruits Neurological: grossly nonfocal  LABS  CBC No results for input(s): WBC, NEUTROABS, HGB, HCT, MCV, PLT in the last 72 hours. Basic Metabolic Panel  Recent Labs  02/20/15 0327 02/22/15 1010  NA 137 132*  K 3.6 3.1*  CL 102 94*  CO2 25 27  GLUCOSE 137* 117*  BUN 16 11  CREATININE 0.80 0.70  CALCIUM 8.0* 7.6*   Liver Function Tests  Recent Labs  02/20/15 0327  AST 66*  ALT 52  ALKPHOS 114  BILITOT 1.6*  PROT 6.4*  ALBUMIN 3.0*   No results for input(s): LIPASE, AMYLASE in the last 72 hours. Cardiac Enzymes  Recent Labs  02/19/15 1748 02/19/15 2212  TROPONINI 0.13* 0.11*   BNP Invalid input(s): POCBNP D-Dimer No results for input(s): DDIMER in the last 72 hours. Hemoglobin A1C No results for input(s): HGBA1C in the last 72 hours. Fasting Lipid Panel No results for input(s): CHOL, HDL, LDLCALC, TRIG, CHOLHDL, LDLDIRECT in the last 72 hours. Thyroid Function Tests  Recent Labs  02/19/15 1800  TSH 2.463    Radiology Studies Imaging results have been reviewed and No results found.  TELE NSR, frequent PACs  ECHO LVEF 40-45% Moderate to severe MR due to prolapse Elevated filing pressures.  ASSESSMENT AND PLAN  Acute on chronic severe combined systolic and diastolic heart failure secondary to severe mitral insufficiency, with secondary atrial tachyarrhythmia that caused acute decompensation  Switch amiodarone to PO Continue diuretics - continued evidence of CHF despite good rhythm control Will try a tiny dose of ACEi, limited by her naturally low BP. Surgery is only real solution  for her valvular problem and is not a valid option in her judgement or mine due to age and frailty.   Sanda Klein, MD, [facility] [phone] office [phone] pager 02/22/2015 1:55 PM

## 2015-02-22 NOTE — Progress Notes (Addendum)
Occupational Therapy Treatment Patient Details Name: Christina Buckley MRN: [id number] DOB: Oct 11, 1924 Today's Date: 02/22/2015    History of present illness [name] is a 81yo woman with PMHx of GERD, osteoporosis, and scoliosis who presents to the ED with shortness of breath and palpitations found to be in atrial fibrillation with RVR with HR in 160s   OT comments  Pt able to ambulate to sink and perform ADL tasks. See vital section below.  Follow Up Recommendations  No OT follow up;Supervision - Intermittent    Equipment Recommendations  None recommended by OT    Recommendations for Other Services      Precautions / Restrictions Precautions Precautions: Fall Precaution Comments: reliant on walker at home, new O2 dependency Restrictions Weight Bearing Restrictions: No       Mobility Bed Mobility         General bed mobility comments: not assessed  Transfers Overall transfer level: Needs assistance Equipment used: Rolling walker (2 wheeled) Transfers: Sit to/from Stand Sit to Stand: Supervision        General transfer comment: Supervision for safety.    Balance  Min guard for ambulation with RW.   ADL Overall ADL's : Needs assistance/impaired         Upper Body Bathing: Set up;Supervision/ safety;Standing   Lower Body Bathing: Set up;Supervison/ safety (standing washed peri area/buttocks)       Lower Body Dressing: Supervision/safety;Set up;Sitting/lateral leans (donned/doffed socks)   Toilet Transfer: Min guard;Ambulation;RW;BSC           Functional mobility during ADLs: Min guard;Rolling walker General ADL Comments: Educated on deep breathing technique and energy conservation. Pt ambulated to sink and performed ADLs.       Vision                     Perception     Praxis      Cognition  Awake/Alert Behavior During Therapy: WFL for tasks assessed/performed Overall Cognitive Status: Within Functional Limits for tasks  assessed                       Extremity/Trunk Assessment               Exercises     Shoulder Instructions       General Comments      Pertinent Vitals/ Pain       Pain Assessment: 0-10 Pain Score: 5  Pain Location: right knee Pain Intervention(s): Monitored during session  **Pt on 5L of O2 in session and O2 ranging from 70s to 100 in session. RR up to 30s. HR and O2 stable at end of session.  Home Living                                          Prior Functioning/Environment              Frequency Min 2X/week     Progress Toward Goals  OT Goals(current goals can now be found in the care plan section)  Progress towards OT goals: Progressing toward goals  Acute Rehab OT Goals Patient Stated Goal: not stated OT Goal Formulation: With patient Time For Goal Achievement: 02/28/15 Potential to Achieve Goals: Good ADL Goals Pt Will Perform Grooming: with set-up;standing Pt Will Perform Upper Body Bathing: with set-up;standing;sitting Pt Will Perform Lower Body Bathing: with set-up;sit to/from stand  Pt Will Perform Lower Body Dressing: with set-up;sit to/from stand Pt Will Transfer to Toilet: with modified independence;ambulating (with RW) Additional ADL Goal #1: Pt will independently verbalize 3/3 energy conservation techniques.  Plan Discharge plan remains appropriate    Co-evaluation                 End of Session Equipment Utilized During Treatment: Gait belt;Rolling walker;Oxygen   Activity Tolerance Patient tolerated treatment well   Patient Left in chair;with call bell/phone within reach   Nurse Communication          Time: 1497-0263 OT Time Calculation (min): 17 min  Charges: OT General Charges $OT Visit: 1 Procedure OT Treatments $Self Care/Home Management : 8-22 mins  Benito Mccreedy OTR/L [phone] 02/22/2015, 1:58 PM

## 2015-02-22 NOTE — Progress Notes (Addendum)
Subjective: Patient had some dyspnea overnight and desaturated into the mid 80's. She was evaluated by Dr. Aundra Dubin with cardiology and given Lasix 60 mg IV once. Her breathing improved significantly. She states this morning she is breathing well, dry cough occasionally. She is on 6 L oxygen via [place]. Her HR has remained in the low 100s. Review of telemetry shows atrial tachycardia.   Nurse ordered UA yesterday due to urine being dark and having foul odor. Patient denies any dysuria, hematuria, frequency, and abdominal pain.   Objective: Vital signs in last 24 hours: Filed Vitals:   02/22/15 0304 02/22/15 0339 02/22/15 0404 02/22/15 0700  BP: 118/75 122/82 113/63   Pulse: 92 96 108   Temp:  97.9 F (36.6 C)  97.7 F (36.5 C)  TempSrc:  Oral  Oral  Resp:  24 30   Height:      Weight:  106 lb 12.4 oz (48.432 kg)    SpO2: 91% 92% 83%    Weight change: -10 oz (-0.284 kg)  Intake/Output Summary (Last 24 hours) at 02/22/15 0846 Last data filed at 02/22/15 5038  Gross per 24 hour  Intake 598.83 ml  Output   2425 ml  Net -1826.17 ml   Physical Exam General: thin elderly woman sitting up in bed, pleasant, NAD HEENT: [place]/AT, EOMI, mucus membranes moist CV: RRR, 3/6 systolic murmur heard best at LUSB Chest: significant kyphoscoliosis, no crackles at bases, breaths non-labored on oxygen via [place] Abd: BS+, soft, non-tender, non-distended, no CVA tenderness Ext: warm, no peripheral edema Neuro: alert and oriented x 3, no focal deficits  Lab Results: Basic Metabolic Panel:  Recent Labs Lab 02/19/15 0840 02/19/15 0900 02/20/15 0327  NA 138 139 137  K 3.7 3.7 3.6  CL 108 105 102  CO2 23  --  25  GLUCOSE 140* 140* 137*  BUN 19 19 16   CREATININE 0.83 0.70 0.80  CALCIUM 7.7*  --  8.0*   Liver Function Tests:  Recent Labs Lab 02/19/15 0840 02/20/15 0327  AST 65* 66*  ALT 34 52  ALKPHOS 97 114  BILITOT 1.4* 1.6*  PROT 5.7* 6.4*  ALBUMIN 2.8* 3.0*   CBC:  Recent Labs Lab  02/19/15 0840 02/19/15 0900  WBC 6.7  --   HGB 12.0 11.9*  HCT 35.7* 35.0*  MCV 96.7  --   PLT 152  --    Cardiac Enzymes:  Recent Labs Lab 02/19/15 1119 02/19/15 1748 02/19/15 2212  TROPONINI 0.06* 0.13* 0.11*   CBG:  Recent Labs Lab 02/19/15 0849  GLUCAP 124*   Thyroid Function Tests:  Recent Labs Lab 02/19/15 1800  TSH 2.463   Coagulation:  Recent Labs Lab 02/19/15 0840  LABPROT 16.0*  INR 1.26   Urinalysis:  Recent Labs Lab 02/21/15 1048  COLORURINE AMBER*  LABSPEC 1.015  PHURINE 5.5  GLUCOSEU NEGATIVE  HGBUR SMALL*  BILIRUBINUR NEGATIVE  KETONESUR NEGATIVE  PROTEINUR NEGATIVE  UROBILINOGEN 0.2  NITRITE NEGATIVE  LEUKOCYTESUR MODERATE*   Medications: I have reviewed the patient's current medications.  Assessment/Plan: AFib with RVR s/p Cardioversion: Patient still in and out of atrial tachycardia. HR improved, now maintaining in low 100s. Echo showed EF 88-28%, grade 2 diastolic dysfunction, moderate-severe MR, and moderate pulmonary HTN (PAP 48). Cardiology feels her heart failure is secondary to her arrhythmia and MR. Will focus on medical treatment for controlling her arrhythmia as she does not want aggressive treatment.  - Cardiology following, appreciate recommendations - Continue Eliquis 2.5 mg BID -  Continue IV Amio  - f/u appt with Dr. Victorino December office   Hypoxia: Patient desaturated again into the 80's overnight. Required additional Lasix. Her O2 sat is now stable in the low 90s. No crackles on exam. Hopefully getting her arrhythmia under control will help with her HF/pulmonary edema. She is otherwise not volume overloaded on exam.  - Continue oxygen therapy as needed - May need additional low dose Lasix if becomes symptomatic   Hypotension: BP stable in 623J-628B systolic.  - Continue to monitor   Asymptomatic Bacteriuria: UA with 20-50 WBCs, many bacteria, and positive leukocytes, but patient completely asymptomatic. No CVA or  abdominal pain on exam. Will not treat as therapy not indicated.   Right Knee Pain: Patient complaining of right knee pain that she had prior to admission while working in her garden. She had taken prednisone prior to admission which helped her pain. She states the heading pads and tylenol help her pain currently.  - Continue heating pads and tylenol PRN  Elevated Glucose: Glucose in 140s. Patient recently took Prednisone 60 mg x 1 (on 4/29) for knee pain. Likely related to her recent steroid use and acute stress from arrhythmia. - Continue to monitor   Elevated LFTs: AST 65, TBili 1.4 on admission. Repeat today shows AST 66, TBili 1.6. Patient does not have any abdominal symptoms and no tenderness on exam. Patient denies any alcohol use. Will have her follow up with her PCP.   GERD: Patient takes Prilosec 20 mg daily. - Continue home Prilosec   Likely Osteopenia: Presumed diagnosis since patient is taking Fosamax 70 mg Qweekly. She is also on calcium and vitamin D supplementation.  - Continue home multivitamin  - PT/OT consult>> no PT or OT needs at this time  Scoliosis: Patient reported and noted on CXR. Patient takes Norco 5-325 mg Q4H PRN pain at home.  - Hold home pain meds for now given hypotension   Diet: Regular  VTE PPx: Eliquis  Dispo: Discharge possibly today or tomorrow   The patient does have a current PCP (Leonides Sake, MD) and does need an [facility] hospital follow-up appointment after discharge.  The patient does not have transportation limitations that hinder transportation to clinic appointments.  .Services Needed at time of discharge: Y = Yes, Blank = No PT:   OT:   RN:   Equipment:   Other:     LOS: 1 day   Juliet Rude, MD 02/22/2015, 8:46 AM

## 2015-02-23 DIAGNOSIS — I5040 Unspecified combined systolic (congestive) and diastolic (congestive) heart failure: Secondary | ICD-10-CM

## 2015-02-23 DIAGNOSIS — I471 Supraventricular tachycardia: Secondary | ICD-10-CM

## 2015-02-23 LAB — URINE CULTURE: Colony Count: >=100000

## 2015-02-23 LAB — BASIC METABOLIC PANEL
Anion gap: 8 (ref 5–15)
BUN: 11 mg/dL (ref 6–20)
CHLORIDE: 97 mmol/L — AB (ref 101–111)
CO2: 28 mmol/L (ref 22–32)
Calcium: 7.8 mg/dL — ABNORMAL LOW (ref 8.9–10.3)
Creatinine, Ser: 0.76 mg/dL (ref 0.44–1.00)
GFR calc non Af Amer: >60 mL/min (ref >60)
Glucose, Bld: 123 mg/dL — ABNORMAL HIGH (ref 70–99)
Potassium: 3.8 mmol/L (ref 3.5–5.1)
Sodium: 133 mmol/L — ABNORMAL LOW (ref 135–145)

## 2015-02-23 LAB — BRAIN NATRIURETIC PEPTIDE: B Natriuretic Peptide: 299.9 pg/mL — ABNORMAL HIGH (ref 0.0–100.0)

## 2015-02-23 MED ORDER — GUAIFENESIN-CODEINE 100-10 MG/5ML PO SOLN
5.0000 mL | ORAL | Status: AC | PRN
  Administered 2015-02-23 – 2015-02-24 (×3): 5 mL via ORAL
  Filled 2015-02-23 (×3): qty 5

## 2015-02-23 MED ORDER — LISINOPRIL 5 MG PO TABS
5.0000 mg | ORAL_TABLET | Freq: Every day | ORAL | Status: DC
  Administered 2015-02-24: 5 mg via ORAL
  Filled 2015-02-23: qty 1

## 2015-02-23 MED ORDER — AMIODARONE HCL 200 MG PO TABS
300.0000 mg | ORAL_TABLET | Freq: Two times a day (BID) | ORAL | Status: DC
  Administered 2015-02-23 – 2015-02-25 (×4): 300 mg via ORAL
  Filled 2015-02-23 (×8): qty 1

## 2015-02-23 MED ORDER — FUROSEMIDE 10 MG/ML IJ SOLN
20.0000 mg | Freq: Once | INTRAMUSCULAR | Status: AC
  Administered 2015-02-23: 20 mg via INTRAVENOUS

## 2015-02-23 MED ORDER — FUROSEMIDE 40 MG PO TABS
40.0000 mg | ORAL_TABLET | Freq: Every day | ORAL | Status: DC
  Administered 2015-02-24 – 2015-02-25 (×2): 40 mg via ORAL
  Filled 2015-02-23 (×2): qty 1

## 2015-02-23 MED ORDER — MORPHINE SULFATE 2 MG/ML IJ SOLN
2.0000 mg | Freq: Once | INTRAMUSCULAR | Status: DC
  Filled 2015-02-23: qty 1

## 2015-02-23 MED ORDER — FUROSEMIDE 20 MG PO TABS
20.0000 mg | ORAL_TABLET | Freq: Every day | ORAL | Status: DC
  Administered 2015-02-23: 20 mg via ORAL
  Filled 2015-02-23 (×2): qty 1

## 2015-02-23 MED ORDER — FUROSEMIDE 10 MG/ML IJ SOLN
INTRAMUSCULAR | Status: AC
  Administered 2015-02-23: 20 mg
  Filled 2015-02-23: qty 2

## 2015-02-23 MED ORDER — FUROSEMIDE 10 MG/ML IJ SOLN
20.0000 mg | Freq: Once | INTRAMUSCULAR | Status: AC
  Administered 2015-02-23: 20 mg via INTRAVENOUS
  Filled 2015-02-23: qty 2

## 2015-02-23 NOTE — Progress Notes (Signed)
  Date: 02/23/2015  Patient name: Christina Buckley  Medical record number: [id number]  Date of birth: 1924/06/04   This patient has been seen and the plan of care was discussed with the house staff. Please see their note for complete details. I concur with their findings with the following additions/corrections: She remains hypoxic even at rest on RA. Dr Arcelia Jew is changing lasix to PO and admin BID and Dr C is admin an extra IV dose lasix. Her Cr can tolerate diuresis although her BP might be limiting factor (OK for now). She is now rate controlled on amio. Once euvolemic, may be D/C'd.   Bartholomew Crews, MD 02/23/2015, 12:07 PM

## 2015-02-23 NOTE — Progress Notes (Signed)
Subjective: Overnight, patient became dyspneic and had increased work of breathing. She improved after a one time dose of Lasix 20 mg IV. This morning she states her breathing is better. She is saturating 93% on 4 L oxygen. She is rate controlled with HR in 90s-100s.   Objective: Vital signs in last 24 hours: Filed Vitals:   02/22/15 2348 02/23/15 0330 02/23/15 0440 02/23/15 0500  BP: 109/54 119/68  112/71  Pulse: 98 97  101  Temp: 98.4 F (36.9 C)   98.2 F (36.8 C)  TempSrc: Oral   Oral  Resp: 21 25  26   Height:      Weight:   109 lb 4.8 oz (49.578 kg)   SpO2: 94% 91%  93%   Weight change: 2 lb 8.4 oz (1.146 kg)  Intake/Output Summary (Last 24 hours) at 02/23/15 0824 Last data filed at 02/23/15 0400  Gross per 24 hour  Intake    240 ml  Output    320 ml  Net    -80 ml   Physical Exam General: thin elderly woman sitting up in bed, pleasant, NAD HEENT: [place]/AT, EOMI, mucus membranes moist CV: RRR, 3/6 systolic murmur heard best at LUSB Chest: significant kyphoscoliosis, no crackles at bases, breaths non-labored on oxygen via [place] Abd: BS+, soft, non-tender, non-distended, no CVA tenderness Ext: warm, no peripheral edema Neuro: alert and oriented x 3, no focal deficits  Lab Results: Basic Metabolic Panel:  Recent Labs Lab 02/22/15 1010 02/23/15 0347  NA 132* 133*  K 3.1* 3.8  CL 94* 97*  CO2 27 28  GLUCOSE 117* 123*  BUN 11 11  CREATININE 0.70 0.76  CALCIUM 7.6* 7.8*   Liver Function Tests:  Recent Labs Lab 02/19/15 0840 02/20/15 0327  AST 65* 66*  ALT 34 52  ALKPHOS 97 114  BILITOT 1.4* 1.6*  PROT 5.7* 6.4*  ALBUMIN 2.8* 3.0*   CBC:  Recent Labs Lab 02/19/15 0840 02/19/15 0900  WBC 6.7  --   HGB 12.0 11.9*  HCT 35.7* 35.0*  MCV 96.7  --   PLT 152  --    Cardiac Enzymes:  Recent Labs Lab 02/19/15 1119 02/19/15 1748 02/19/15 2212  TROPONINI 0.06* 0.13* 0.11*   CBG:  Recent Labs Lab 02/19/15 0849  GLUCAP 124*   Thyroid Function  Tests:  Recent Labs Lab 02/19/15 1800  TSH 2.463   Coagulation:  Recent Labs Lab 02/19/15 0840  LABPROT 16.0*  INR 1.26    Medications: I have reviewed the patient's current medications.  Assessment/Plan:  AFib with RVR s/p Cardioversion: Patient is rate controlled with HR in 90s-100s. She was switched to PO amiodarone yesterday. Echo showed EF 63-89%, grade 2 diastolic dysfunction, moderate-severe MR, mitral valve prolapse, and moderate pulmonary HTN (PAP 48). She is still having significant HF symptoms despite being rate controlled. She was started on Lasix 40 mg daily and Lisinopril 2.5 mg daily yesterday and still required extra Lasix overnight. She might benefit from twice a day dosing of Lasix.   - Cardiology following, appreciate recommendations - Continue Eliquis 2.5 mg BID - Continue Amiodarone  200 mg BID - Start Lasix 40 mg PO in AM and 20 mg in PM - f/u appt with Dr. Victorino December office   Combined Systolic and Diastolic HF: Echo results noted above. Patient became dyspneic again overnight and required additional Lasix despite being started on Lasix 40 mg daily yesterday. Her O2 sat is now stable in the low 90s on 4 L  oxygen. No crackles on exam. No other signs of HF. She may benefit from twice daily dosing of Lasix.  - Continue Lisinopril 2.5 mg daily - Change Lasix 40 mg in AM and 20 mg in evening  - Continue oxygen therapy as needed, may require home oxygen   Hypotension-Improving: BP stable in 374M-270B systolic.  - Continue to monitor   Asymptomatic Bacteriuria: UA with 20-50 WBCs, many bacteria, and positive leukocytes, but patient completely asymptomatic. No CVA or abdominal pain on exam. Will not treat as therapy not indicated.   Right Knee Pain: Patient complaining of right knee pain that she had prior to admission while working in her garden. She had taken prednisone prior to admission which helped her pain. She states the heading pads and tylenol help her  pain currently.  - Continue heating pads and tylenol PRN  Elevated Glucose: Glucose in 140s. Patient recently took Prednisone 60 mg x 1 (on 4/29) for knee pain. Likely related to her recent steroid use and acute stress from arrhythmia. - Continue to monitor   Elevated LFTs: AST 65, TBili 1.4 on admission. Repeat today shows AST 66, TBili 1.6. Patient does not have any abdominal symptoms and no tenderness on exam. Patient denies any alcohol use. Will have her follow up with her PCP.   GERD: Patient takes Prilosec 20 mg daily. - Continue home Prilosec   Likely Osteopenia: Presumed diagnosis since patient is taking Fosamax 70 mg Qweekly. She is also on calcium and vitamin D supplementation.  - Continue home multivitamin  - PT/OT consult>> no PT or OT needs at this time  Scoliosis: Patient reported and noted on CXR. Patient takes Norco 5-325 mg Q4H PRN pain at home.  - Patient has not required Norco so far. Can give if pain significant and BP can tolerate  Diet: Regular  VTE PPx: Eliquis  Dispo: Discharge possibly tomorrow   The patient does have a current PCP (Leonides Sake, MD) and does need an [facility] hospital follow-up appointment after discharge.  The patient does not have transportation limitations that hinder transportation to clinic appointments.  .Services Needed at time of discharge: Y = Yes, Blank = No PT:   OT:   RN:   Equipment:   Other:     LOS: 2 days   Juliet Rude, MD 02/23/2015, 8:24 AM

## 2015-02-23 NOTE — Care Management (Signed)
02/23/15 Elenor Quinones RN BSN [id number]     Medicare Important Message given?  Yes (If response is "NO", the following Medicare IM given date fields will be blank) Date Medicare IM given:   02/23/15 Medicare IM given by:  Elenor Quinones

## 2015-02-23 NOTE — Progress Notes (Signed)
SATURATION QUALIFICATIONS: (This note is used to comply with regulatory documentation for home oxygen)  Patient Saturations on Room Air at Rest = 84%  Patient Saturations on Room Air while Ambulating = 77%  Patient Saturations on 4 Liters of oxygen while Ambulating = 91%  Please briefly explain why patient needs home oxygen:  Unable to ambulate more than few steps without O2 prior to desauration.  [place], [place] 02/23/2015

## 2015-02-23 NOTE — Progress Notes (Signed)
  Patient Name: Christina Buckley Date of Encounter: 02/23/2015  Principal Problem:   Atrial fibrillation with RVR Active Problems:   Hypotension   GERD (gastroesophageal reflux disease)   Mitral valve prolapse   Severe mitral insufficiency   Acute on chronic combined systolic and diastolic congestive heart failure   Length of Stay: 2  SUBJECTIVE  Still with cough and dyspnea. Breakthrough arrhythmia last night and received more iV diuretic. BP a little higher.  CURRENT MEDS . amiodarone  200 mg Oral BID  . apixaban  2.5 mg Oral Q12H  . furosemide  20 mg Oral Daily  . [START ON 02/24/2015] furosemide  40 mg Oral Daily  . [START ON 02/24/2015] lisinopril  5 mg Oral Daily  . multivitamin with minerals  1 tablet Oral Daily  . pantoprazole  40 mg Oral Daily  . sodium chloride  500 mL Intravenous Once  . sodium chloride  3 mL Intravenous Q12H    OBJECTIVE   Intake/Output Summary (Last 24 hours) at 02/23/15 1157 Last data filed at 02/23/15 0400  Gross per 24 hour  Intake    240 ml  Output    320 ml  Net    -80 ml   Filed Weights   02/21/15 0415 02/22/15 0339 02/23/15 0440  Weight: 107 lb 6.4 oz (48.716 kg) 106 lb 12.4 oz (48.432 kg) 109 lb 4.8 oz (49.578 kg)    PHYSICAL EXAM Filed Vitals:   02/23/15 0330 02/23/15 0440 02/23/15 0500 02/23/15 1025  BP: 119/68  112/71   Pulse: 97  101 118  Temp:   98.2 F (36.8 C)   TempSrc:   Oral   Resp: 25  26   Height:      Weight:  109 lb 4.8 oz (49.578 kg)    SpO2: 91%  93% 91%   General: Alert, oriented x3, no distress Head: no evidence of trauma, PERRL, EOMI, no exophtalmos or lid lag, no myxedema, no xanthelasma; normal ears, nose and oropharynx Neck: normal jugular venous pulsations and no hepatojugular reflux; brisk carotid pulses without delay and no carotid bruits Chest: reduced in left base, but better than yesterday, no signs of consolidation by percussion or palpation, normal fremitus, symmetrical and full respiratory  excursions Cardiovascular: normal position and quality of the apical impulse, regular rhythm, normal first and second heart sounds, no rubs or gallops, 3/6 holosystolic apical murmur, no diastolic murmur Abdomen: no tenderness or distention, no masses by palpation, no abnormal pulsatility or arterial bruits, normal bowel sounds, no hepatosplenomegaly Extremities: no clubbing, cyanosis or edema; 2+ radial, ulnar and brachial pulses bilaterally; 2+ right femoral, posterior tibial and dorsalis pedis pulses; 2+ left femoral, posterior tibial and dorsalis pedis pulses; no subclavian or femoral bruits Neurological: grossly nonfocal  LABS  CBC No results for input(s): WBC, NEUTROABS, HGB, HCT, MCV, PLT in the last 72 hours. Basic Metabolic Panel  Recent Labs  02/22/15 1010 02/23/15 0347  NA 132* 133*  K 3.1* 3.8  CL 94* 97*  CO2 27 28  GLUCOSE 117* 123*  BUN 11 11  CREATININE 0.70 0.76  CALCIUM 7.6* 7.8*   Liver Function Tests  Radiology Studies Imaging results have been reviewed and No results found.  TELE PAT last night  ASSESSMENT AND PLAN  Still hypervolemic, will give another dose of IV lasix today. Increase amiodarone and PO lisinopril.   Sanda Klein, MD, [facility] [phone] office [phone] pager 02/23/2015 11:57 AM

## 2015-02-23 NOTE — Progress Notes (Signed)
Physical Therapy Treatment Patient Details Name: Christina Buckley MRN: [id number] DOB: 02-19-1924 Today's Date: 03-14-15    History of Present Illness Christina Buckley is a 81yo woman with PMHx of GERD, osteoporosis, and scoliosis who presents to the ED with shortness of breath and palpitations found to be in atrial fibrillation with RVR with HR in 160s    PT Comments    Patient wished to walk second session, but was well fatigued after up in chair.  OT just finished and reports educated in energy conservation.  Reinforced this education and encouraged rest breaks between activities including HHPT.  Follow Up Recommendations  Home health PT     Equipment Recommendations  None recommended by PT    Recommendations for Other Services       Precautions / Restrictions Precautions Precautions: Fall Precaution Comments: reliant on walker at home, new O2 dependency    Mobility  Bed Mobility Overal bed mobility: Needs Assistance         Sit to supine: Supervision   General bed mobility comments: cues for positioning in bed  Transfers Overall transfer level: Needs assistance   Transfers: Sit to/from Stand;Stand Pivot Transfers Sit to Stand: Supervision Stand pivot transfers: Min assist       General transfer comment: for safety with O2 and monitors  Ambulation/Gait Ambulation/Gait assistance: Supervision Ambulation Distance (Feet): 150 Feet Assistive device: Rolling walker (2 wheeled) Gait Pattern/deviations: Decreased stride length;Trunk flexed;Antalgic     General Gait Details: stops to focus on breathing during session due to SpO2 reading low with 4L O2, still left knee pain with ambulation   Stairs            Wheelchair Mobility    Modified Rankin (Stroke Patients Only)       Balance Overall balance assessment: Needs assistance           Standing balance-Leahy Scale: Fair                      Cognition Arousal/Alertness:  Awake/alert Behavior During Therapy: WFL for tasks assessed/performed Overall Cognitive Status: Within Functional Limits for tasks assessed                      Exercises      General Comments        Pertinent Vitals/Pain Pain Assessment: No/denies pain    Home Living                      Prior Function            PT Goals (current goals can now be found in the care plan section) Progress towards PT goals: Progressing toward goals    Frequency  Min 3X/week    PT Plan Current plan remains appropriate    Co-evaluation             End of Session Equipment Utilized During Treatment: Gait belt;Oxygen Activity Tolerance: Patient tolerated treatment well Patient left: in bed;with call bell/phone within reach     Time: 1638-4665 PT Time Calculation (min) (ACUTE ONLY): 12 min  Charges:  $Gait Training: 8-22 mins                    G Codes:      Christina Buckley,Christina Buckley 2015-03-14, 3:12 PM  Christina Buckley, Christina Buckley Mar 14, 2015

## 2015-02-23 NOTE — Progress Notes (Signed)
Patient sleeping, went into what appeared to be Atrial Tachycardia on the monitor for less than 1 minute. Attempted to obtain EKG but was back into Sinus Rhythm before this could be done. According to physician notes this has been happening. Patient with no complaints at this time. Will continue to monitor.

## 2015-02-23 NOTE — Progress Notes (Signed)
Patient coughing and appears to be short of breath. Lungs very diminished. O2 sats 88-90 on 4LNC. Paged Dr. Raelene Bott to assess.

## 2015-02-23 NOTE — Progress Notes (Signed)
Physical Therapy Treatment Patient Details Name: Christina Buckley MRN: [id number] DOB: [birth year] Today's Date: 02/23/2015    History of Present Illness Christina Buckley is a 81yo woman with PMHx of GERD, osteoporosis, and scoliosis who presents to the ED with shortness of breath and palpitations found to be in atrial fibrillation with RVR with HR in 160s    PT Comments    Patient reports felt good to walk today.  Did not seem to be increasingly distressed despite O2 sat ranging from 91-77 during ambulation on 4L O2  (started without O2 for O2 sat qualifications, but started dropping to 84 prior to walking).  Now feel HHPT would benefit pt due to new home O2 and going home on Eliquis.  Will continue to follow acutely.  Follow Up Recommendations  Home health PT     Equipment Recommendations  None recommended by PT    Recommendations for Other Services       Precautions / Restrictions Precautions Precautions: Fall Precaution Comments: reliant on walker at home, new O2 dependency    Mobility  Bed Mobility   Bed Mobility: Supine to Sit     Supine to sit: Supervision        Transfers Overall transfer level: Needs assistance     Sit to Stand: Min guard;Supervision         General transfer comment: initially stood with loss of balance minguard for steadying, second attempt supervision no loss of balance  Ambulation/Gait Ambulation/Gait assistance: Supervision Ambulation Distance (Feet): 150 Feet Assistive device: Rolling walker (2 wheeled)       General Gait Details: stops to focus on breathing during session due to SpO2 reading low with 4L O2   Stairs            Wheelchair Mobility    Modified Rankin (Stroke Patients Only)       Balance Overall balance assessment: Needs assistance           Standing balance-Leahy Scale: Fair                      Cognition Arousal/Alertness: Awake/alert Behavior During Therapy: WFL for tasks  assessed/performed Overall Cognitive Status: Within Functional Limits for tasks assessed                      Exercises      General Comments        Pertinent Vitals/Pain Faces Pain Scale: Hurts little more Pain Location: right knee initially when walking Pain Descriptors / Indicators: Aching Pain Intervention(s): Monitored during session    Home Living                      Prior Function            PT Goals (current goals can now be found in the care plan section) Progress towards PT goals: Progressing toward goals    Frequency  Min 3X/week    PT Plan Discharge plan needs to be updated    Co-evaluation             End of Session Equipment Utilized During Treatment: Gait belt;Oxygen Activity Tolerance: Patient tolerated treatment well Patient left: in chair;with call bell/phone within reach;with family/visitor present     Time: 1000-1025 PT Time Calculation (min) (ACUTE ONLY): 25 min  Charges:  $Gait Training: 23-37 mins  G Codes:      Christina Buckley,Christina Buckley 02/23/2015, 10:29 AM  Christina Buckley, PT [phone] 02/23/2015

## 2015-02-23 NOTE — Progress Notes (Signed)
IMTS Night Float Progress Note  Called for increased cough and concern for decreased air movement in bilateral lung fields. Patient has been able to sleep through most of the night after Robitussin earlier in the evening but has been woken up by increased cough and slight increased work of breathing. Patient is satting in the 90s on 4 L nasal cannula. Exam remarkable for bibasilar rales. Cardiac exam regular rate and rhythm. No lower extremity edema. Patient has had persistent symptoms and signs of congestive heart failure in the setting of moderate to severe mitral regurgitation secondary to mitral valve prolapse. -Lasix 20 mg IV once now.   Luan Moore, MD, PhD 02/23/15 3:36 AM

## 2015-02-23 NOTE — Progress Notes (Signed)
Pt noted to have inverted T waves (new) with switch to A.fib from NSR on tele monitor. Pt c/o sharp/stab pain that radiates to jaw 3/10 that comes and goes. EKG obtained. MD notified, new orders received for STAT troponin and one time dose morphine 2mg  IV. Will continue to monitor closely.

## 2015-02-23 NOTE — Progress Notes (Signed)
Occupational Therapy Treatment Patient Details Name: Christina Buckley MRN: [id number] DOB: [birth year] Today's Date: 02/23/2015    History of present illness Ms. Broden is a 81yo woman with PMHx of GERD, osteoporosis, and scoliosis who presents to the ED with shortness of breath and palpitations found to be in atrial fibrillation with RVR with HR in 160s      Follow Up Recommendations  Home health OT;Supervision/Assistance - 24 hour    Equipment Recommendations  None recommended by OT       Precautions / Restrictions Precautions Precautions: Fall Precaution Comments: reliant on walker at home, new O2 dependency       Mobility Bed Mobility   Bed Mobility: Supine to Sit     Supine to sit: Supervision        Transfers Overall transfer level: Needs assistance   Transfers: Sit to/from Stand;Stand Pivot Transfers Sit to Stand: Min assist Stand pivot transfers: Min assist       General transfer comment: pt in chair    Balance Overall balance assessment: Needs assistance           Standing balance-Leahy Scale: Fair                     ADL Overall ADL's : Needs assistance/impaired     Grooming: Standing;Set up;Supervision/safety;Wash/dry Sports administrator: Min guard;Ambulation;RW;BSC   Toileting- Water quality scientist and Hygiene: Min guard;Sit to/from stand         General ADL Comments: obtained incentive spirometer for pt and educated on use.  RN aware and agreeable.  Educated pt in energy conservation and pacing. Pt and daugther verbalized understanding/                Cognition   Behavior During Therapy: WFL for tasks assessed/performed Overall Cognitive Status: Within Functional Limits for tasks assessed                               General Comments      Pertinent Vitals/ Pain       Pain Assessment: No/denies pain Faces Pain Scale: Hurts little more Pain Location: right knee initially when  walking Pain Descriptors / Indicators: Aching Pain Intervention(s): Monitored during session  Home Living                                              Frequency Min 2X/week     Progress Toward Goals  OT Goals(current goals can now be found in the care plan section)  Progress towards OT goals: Progressing toward goals     Plan Discharge plan remains appropriate       End of Session Equipment Utilized During Treatment: Rolling walker;Oxygen   Activity Tolerance Patient tolerated treatment well   Patient Left in chair;with call bell/phone within reach   Nurse Communication          Time: 4098-1191 OT Time Calculation (min): 34 min  Charges: OT General Charges $OT Visit: 1 Procedure OT Treatments $Self Care/Home Management : 23-37 mins  Kalla Watson D 02/23/2015, 1:20 PM

## 2015-02-24 DIAGNOSIS — E876 Hypokalemia: Secondary | ICD-10-CM

## 2015-02-24 LAB — BASIC METABOLIC PANEL
ANION GAP: 9 (ref 5–15)
BUN: 10 mg/dL (ref 6–20)
CHLORIDE: 92 mmol/L — AB (ref 101–111)
CO2: 30 mmol/L (ref 22–32)
Calcium: 7.6 mg/dL — ABNORMAL LOW (ref 8.9–10.3)
Creatinine, Ser: 0.82 mg/dL (ref 0.44–1.00)
GFR calc Af Amer: >60 mL/min (ref >60)
GFR calc non Af Amer: >60 mL/min (ref >60)
Glucose, Bld: 131 mg/dL — ABNORMAL HIGH (ref 70–99)
Potassium: 3.4 mmol/L — ABNORMAL LOW (ref 3.5–5.1)
SODIUM: 131 mmol/L — AB (ref 135–145)

## 2015-02-24 LAB — TROPONIN I: Troponin I: <0.03 ng/mL (ref <0.031)

## 2015-02-24 MED ORDER — POTASSIUM CHLORIDE ER 10 MEQ PO TBCR: 20.0000 meq | EXTENDED_RELEASE_TABLET | Freq: Every day | ORAL | Status: DC

## 2015-02-24 MED ORDER — LISINOPRIL 5 MG PO TABS
5.0000 mg | ORAL_TABLET | Freq: Two times a day (BID) | ORAL | Status: DC
  Administered 2015-02-24: 5 mg via ORAL
  Filled 2015-02-24: qty 1

## 2015-02-24 MED ORDER — APIXABAN 2.5 MG PO TABS: 2.5000 mg | ORAL_TABLET | Freq: Two times a day (BID) | ORAL | Status: DC

## 2015-02-24 MED ORDER — POTASSIUM CHLORIDE CRYS ER 20 MEQ PO TBCR
40.0000 meq | EXTENDED_RELEASE_TABLET | Freq: Once | ORAL | Status: AC
  Administered 2015-02-24: 40 meq via ORAL
  Filled 2015-02-24: qty 2

## 2015-02-24 MED ORDER — FUROSEMIDE 40 MG PO TABS: 40.0000 mg | ORAL_TABLET | Freq: Two times a day (BID) | ORAL | Status: DC

## 2015-02-24 MED ORDER — LISINOPRIL 5 MG PO TABS: 5.0000 mg | ORAL_TABLET | Freq: Two times a day (BID) | ORAL | Status: DC

## 2015-02-24 MED ORDER — FUROSEMIDE 10 MG/ML IJ SOLN
40.0000 mg | Freq: Once | INTRAMUSCULAR | Status: AC
  Administered 2015-02-24: 40 mg via INTRAVENOUS
  Filled 2015-02-24: qty 4

## 2015-02-24 MED ORDER — AMIODARONE HCL 200 MG PO TABS: 300.0000 mg | ORAL_TABLET | Freq: Two times a day (BID) | ORAL | Status: DC

## 2015-02-24 MED ORDER — FUROSEMIDE 40 MG PO TABS: 40.0000 mg | ORAL_TABLET | Freq: Every day | ORAL | Status: DC

## 2015-02-24 NOTE — Progress Notes (Signed)
Patient was weaned down to 1L of O2 while lying in the bed. Oxygen saturation = 89/90%. While getting up to the [facility] with family present in room, patient dropped to 85%. Patient took a few minutes to return to 90% while on 3L. Patient due to ambulate with PT during shift. Current oxygen saturation 94% on 3L, IV lasix administered. Will continue to monitor.

## 2015-02-24 NOTE — Progress Notes (Addendum)
Patient Name: Christina Buckley Date of Encounter: 02/24/2015  Principal Problem:   Atrial fibrillation with RVR Active Problems:   Hypotension   GERD (gastroesophageal reflux disease)   Mitral valve prolapse   Severe mitral insufficiency   Acute on chronic combined systolic and diastolic congestive heart failure   Length of Stay: 3  SUBJECTIVE  Still with cough and dyspnea with minimal exertion. PAT now infrequent, usually measurable in seconds or a fe minutes.   CURRENT MEDS . amiodarone  300 mg Oral BID  . apixaban  2.5 mg Oral Q12H  . furosemide  40 mg Oral Daily  . lisinopril  5 mg Oral Daily  .  morphine injection  2 mg Intravenous Once  . multivitamin with minerals  1 tablet Oral Daily  . pantoprazole  40 mg Oral Daily  . sodium chloride  500 mL Intravenous Once  . sodium chloride  3 mL Intravenous Q12H    OBJECTIVE   Intake/Output Summary (Last 24 hours) at 02/24/15 1149 Last data filed at 02/24/15 0439  Gross per 24 hour  Intake    480 ml  Output    450 ml  Net     30 ml   Filed Weights   02/22/15 0339 02/23/15 0440 02/24/15 0446  Weight: 106 lb 12.4 oz (48.432 kg) 109 lb 4.8 oz (49.578 kg) 108 lb 3.2 oz (49.079 kg)    PHYSICAL EXAM Filed Vitals:   02/23/15 2055 02/24/15 0018 02/24/15 0446 02/24/15 0745  BP:  90/54 108/60   Pulse: 50 78 81   Temp:  98.9 F (37.2 C) 97.8 F (36.6 C) 99 F (37.2 C)  TempSrc:  Oral Oral Oral  Resp: 31 18 29    Height:   5\' 3"  (1.6 m)   Weight:   108 lb 3.2 oz (49.079 kg)   SpO2: 94% 96% 91%    General: Alert, oriented x3, no distress, kyphoscoliosis. Head: no evidence of trauma, PERRL, EOMI, no exophtalmos or lid lag, no myxedema, no xanthelasma; normal ears, nose and oropharynx Neck: normal jugular venous pulsations and no hepatojugular reflux; brisk carotid pulses without delay and no carotid bruits Chest: reduced in left base, but better than yesterday, no signs of consolidation by percussion or palpation,  normal fremitus, symmetrical and full respiratory excursions Cardiovascular: normal position and quality of the apical impulse, regular rhythm, normal first and second heart sounds, no rubs or gallops, 3/6 holosystolic apical murmur, no diastolic murmur Abdomen: no tenderness or distention, no masses by palpation, no abnormal pulsatility or arterial bruits, normal bowel sounds, no hepatosplenomegaly Extremities: no clubbing, cyanosis or edema; 2+ radial, ulnar and brachial pulses bilaterally; 2+ right femoral, posterior tibial and dorsalis pedis pulses; 2+ left femoral, posterior tibial and dorsalis pedis pulses; no subclavian or femoral bruits Neurological: grossly nonfocal  LABS  CBC No results for input(s): WBC, NEUTROABS, HGB, HCT, MCV, PLT in the last 72 hours. Basic Metabolic Panel  Recent Labs  02/23/15 0347 02/24/15 0254  NA 133* 131*  K 3.8 3.4*  CL 97* 92*  CO2 28 30  GLUCOSE 123* 131*  BUN 11 10  CREATININE 0.76 0.82  CALCIUM 7.8* 7.6*   Liver Function Tests No results for input(s): AST, ALT, ALKPHOS, BILITOT, PROT, ALBUMIN in the last 72 hours. No results for input(s): LIPASE, AMYLASE in the last 72 hours. Cardiac Enzymes  Recent Labs  02/23/15 2321  TROPONINI <0.03    Radiology Studies Imaging results have been reviewed and No results found.  TELE Rare and brief PAT   ASSESSMENT AND PLAN  Acute on chronic severe combined systolic and diastolic heart failure secondary to severe mitral insufficiency, with secondary atrial tachyarrhythmia that caused acute decompensation. CHADSVasc 4. Unfortunately, output was not accurately recorded (her son was unknowingly emptying her potty). Weight down one pound She still has relatively high O2 requirements and dropped O2 sat to 85% walking to bathroom. Will give another dose of diuretic IV and try to increase ACEi further. Thankfully her renal function and BP have been holding out well. May need to go home on O2. She  reports her weight at home is usually 106 lb. She may also have CAD, but since revascularization will not resolve the severe structural MR, not sure we should pursue evaluation in this frail 81 year old.   Sanda Klein, MD, [facility] [phone] office [phone] pager 02/24/2015 11:49 AM

## 2015-02-24 NOTE — Progress Notes (Signed)
  Date: 02/24/2015  Patient name: Christina Buckley  Medical record number: [id number]  Date of birth: 1924/05/17   This patient has been seen and the plan of care was discussed with the house staff. Please see their note for complete details. I concur with their findings with the following additions/corrections: Ms Radick is lying in bed. States did wake up last PM with cough and angina. Trop and EKG negative. Still O2 requiring although her hypoxia is not quite as bad. No overt vol overload but still has sxs (cough, hypoxia) and Dr C is dosing extra IV lasix today. Her Cr is tolerating lasix quite well and her BP is acceptable. The question is dispo - today or over weekend? She is adamant she is going today. I am worried about readmission. Dr Arcelia Jew will discuss with pt and cards.   Bartholomew Crews, MD 02/24/2015, 1:04 PM

## 2015-02-24 NOTE — Discharge Summary (Signed)
Name: Christina Buckley MRN: [id number] DOB: 16-Jul-1924 81 y.o. PCP: Christina Sake, MD  Date of Admission: 02/19/2015  8:20 AM Date of Discharge: 03/01/2015 Attending Physician: No att. providers found  Discharge Diagnosis: Principal Problem AFib with RVR s/p Cardioversion  Active Problems: Combined Systolic and Diastolic Heart Failure Hypokalemia Hypotension Asymptomatic Bacteriuria Right Knee Pain  Elevated LFTs GERD Likely Osteopenia Scoliosis    Discharge Medications:   Medication List    STOP taking these medications        predniSONE 10 MG (21) Tbpk tablet  Commonly known as:  STERAPRED UNI-PAK 21 TAB      TAKE these medications        acetaminophen 325 MG tablet  Commonly known as:  TYLENOL  Take 650 mg by mouth daily as needed for mild pain.     alendronate 70 MG tablet  Commonly known as:  FOSAMAX  Take 70 mg by mouth once a week. Take on Saturdays     amiodarone 200 MG tablet  Commonly known as:  PACERONE  Take 1.5 tablets (300 mg total) by mouth 2 (two) times daily.     apixaban 2.5 MG Tabs tablet  Commonly known as:  ELIQUIS  Take 1 tablet (2.5 mg total) by mouth every 12 (twelve) hours.     apixaban 2.5 MG Tabs tablet  Commonly known as:  ELIQUIS  Take 1 tablet (2.5 mg total) by mouth 2 (two) times daily.     aspirin EC 81 MG tablet  Take 81 mg by mouth daily.     CALCIUM + D PO  Take 1 tablet by mouth 2 (two) times daily.     CHOLESTOFF PO  Take 1 capsule by mouth 2 (two) times daily.     FISH OIL PO  Take 1 capsule by mouth 2 (two) times daily.     furosemide 40 MG tablet  Commonly known as:  LASIX  Take 1 tablet (40 mg total) by mouth 2 (two) times daily.     lisinopril 2.5 MG tablet  Commonly known as:  PRINIVIL,ZESTRIL  Take 1 tablet (2.5 mg total) by mouth daily.     multivitamin with minerals Tabs tablet  Take 1 tablet by mouth daily.     omeprazole 20 MG capsule  Commonly known as:  PRILOSEC  Take 20 mg by mouth  daily.     potassium chloride SA 20 MEQ tablet  Commonly known as:  K-DUR,KLOR-CON  Take 2 tablets (40 mEq total) by mouth daily.     Red Yeast Rice 600 MG Caps  Take 1,200 mg by mouth 2 (two) times daily.        Disposition and follow-up:   Christina Buckley was discharged from [facility] in Stable condition.  At the hospital follow up visit please address:  1.  AFib- Patient discharged on Amiodarone 300 mg BID and Eliquis 2.5 mg BID. Ensure patient is taking these medications. Heart Failure- Newly diagnosed. Patient started on Lisinopril 2.5 mg daily, Lasix 40 mg in AM AND PM, and K-Dur 20 mEq daily. Please check patient's volume status and bmet to assess K. Follow up cardiology's recommendations in regards to meds.  Hypokalemia- Check CMP. Adjust K-Dur as necessary. BP- Recheck BP. Patient was having hypotension during hospitalization. Please make sure BP tolerating BP meds.  Elevated LFTs- Noted on admission. Please check CMP to re-evaluate.   2.  Labs / imaging needed at time of follow-up: CMP  3.  Pending labs/ test needing follow-up: None   Follow-up Appointments: Follow-up Information    Follow up with Christina Sake, MD.   Specialty:  Family Medicine   Why:  Appointment on May 10th at 10:15 AM   Contact information:   Dr. Daiva Eves [place] [address] [phone]       Follow up with [facility].   Why:  home oxygen and home health physical and occupational therapy and nurse   Contact information:   [address] [place] [address] [phone]       Discharge Instructions: Discharge Instructions    Diet - low sodium heart healthy    Complete by:  As directed      Diet - low sodium heart healthy    Complete by:  As directed      Increase activity slowly    Complete by:  As directed      Increase activity slowly    Complete by:  As directed            Consultations:  Treatment Team:  Rounding Lbcardiology, MD  Procedures Performed:  Dg Chest Port 1 View  02/20/2015   CLINICAL DATA:  Dyspnea.  Hypoxia.  EXAM: PORTABLE CHEST - 1 VIEW  COMPARISON:  02/19/2015  FINDINGS: Vascular and interstitial congestive changes have worsened. There are developing central and medial basilar airspace opacities which may represent alveolar edema. Infectious infiltrates cannot be entirely excluded. There are pleural effusions. The findings likely represent worsening congestive heart failure.  IMPRESSION: Worsening congestive heart failure with developing airspace opacities and pleural effusions.   Electronically Signed   By: Christina Buckley M.D.   On: 02/20/2015 01:01   Dg Chest Port 1 View  02/19/2015   CLINICAL DATA:  Atrial fibrillation.  EXAM: PORTABLE CHEST - 1 VIEW  COMPARISON:  None.  FINDINGS: There is slight interstitial pulmonary edema. There is no airspace consolidation. The heart size and pulmonary vascularity are normal. No adenopathy. There is thoracolumbar levoscoliosis. Note multiple overlying leads and paddles.  IMPRESSION: Mild interstitial edema. No airspace consolidation. Heart size within normal limits.   Electronically Signed   By: Christina Buckley M.D.   On: 02/19/2015 09:20    2D Echo:  02/21/15 Study Conclusions - Left ventricle: The cavity size was normal. There was focal basal hypertrophy. Systolic function was mildly to moderately reduced. The estimated ejection fraction was in the range of 40% to 45%. Wall motion was normal; there were no regional wall motion abnormalities. Features are consistent with a pseudonormal left ventricular filling pattern, with concomitant abnormal relaxation and increased filling pressure (grade 2 diastolic dysfunction). - Aortic valve: There was mild regurgitation. - Mitral valve: Prolapse. There was moderate to severe regurgitation. - Left atrium: The atrium was mildly dilated. - Tricuspid valve:  There was moderate regurgitation. - Pulmonary arteries: Systolic pressure was moderately increased. PA peak pressure: 48 mm Hg (S).  Admission HPI: Christina Buckley is a 81yo woman with PMHx of GERD, osteoporosis, and scoliosis who presents to the ED with shortness of breath and palpitations found to be in atrial fibrillation with RVR with HR in 160s. She was given metoprolol 50 mg by one of her family members this morning. Patient reports she started feeling palpitations last night and that she wasn't breathing well. She noted palpitations again this morning and felt weak. She told a family member that she wanted to go to the hospital but then became  very weak and was unable to make it to the car so EMS was called. She denies any chest pain. She reported feeling dizzy and that she might pass out.   In the ED, patient noted to have hypotension in 38B-01B systolic and actively vomiting. She was given 1.5 L bolus. Synchronized cardioversion was performed and was successful. Her BP has now improved to 51W-258N systolic.   Hospital Course by problem list:  AFib with RVR s/p Cardioversion: Patient presented with palpitations, dyspnea, and weakness found to be in atrial fibrillation with RVR in 160s. Synchronized cardioversion was performed and successfully placed her back into NSR. However, this was transient and she was noted to go in and out of AFib/atrial tachycardia on telemetry. She was started on Eliquis for anticoagulation as her CHADS-VASc score was noted to be 4 placing her at high risk for stroke. She was also started on amiodarone for which she had a good response and HR was controlled in the 90s-100s. She was discharged home on Amiodarone 300 mg BID which will then be titrated down to 200 mg BID in 2 weeks, and Eliquis 2.5 mg BID. She has follow up with Cardiology (Dr. Sallyanne Kuster) on 02/21/15 and with her PCP on 02/28/15.   Combined Systolic and Diastolic Heart Failure: Patient with no prior hx of heart  failure found to have an EF 27-78%, grade 2 diastolic dysfunction, severe MR, mitral valve prolapse, and moderate pulmonary HTN (PAP 48) on echocardiogram. She was having significant dyspnea and required oxygen supplementation via [place]. She was not volume overloaded on exam. She improved with Lasix administration. She was discharged home on Lasix 40 mg in AM and 40 mg in PM, Lisinopril 2.5 mg daily, and K-Dur 40 mEq daily. She also continued to require home oxygen and thus was prescribed this at discharge. She has follow up with cardiology on 02/21/15.   Hypokalemia: Patient had hypokalemia secondary to Lasix administration. Her potassium was appropriately supplemented with K-Dur. Her K upon discharge was 4.0. She was discharged on K-Dur 20 mEq daily. She will need a CMP at her outpatient follow up visit.   Hypotension: Patient experienced hypotension during her hospitalization likely due to her heart failure and arrhythmia. Her arrhythmia was corrected with amiodarone. She had hypotension likely secondary to her HF medications as well. She was discharged on lisinopril 2.5 mg daily and lasix 40 mg in AM and 40 mg in PM. She will need a BP recheck at her outpatient follow up visit.   Asymptomatic Bacteriuria: UA with 20-50 WBCs, many bacteria, and positive leukocytes, but patient remained completely asymptomatic. No CVA or abdominal pain on exam. She was not treated with antibiotics as therapy was not indicated.   Right Knee Pain: Patient had noted right knee pain that she had prior to admission while working in her garden. She had taken prednisone prior to hospitalization which helped her pain. She only required tylenol and heating pads during her hospitalization.    Elevated LFTs: AST 65 and TBili 1.4 noted on admission. Repeat values showed AST 66 and TBili 1.6. Patient did not have any jaundice, abdominal symptoms, or tenderness on exam. She denied any alcohol use. She will need further evaluation and  repeat LFTs at her outpatient visit.   GERD: Patient was continued on her home Prilosec 20 mg daily.   Likely Osteopenia: Presumed diagnosis as patient is frail and takes Fosamax 70 mg Qweekly and calcium and vitamin D supplementation at home. She was continued on her  home multivitamin. PT and OT were consulted and recommended home health PT/OT which was ordered upon discharge.   Scoliosis: Patient reported history and noted on CXR. She normally takes Norco 5-325 mg Q4H PRN at home, but only required Tylenol PRN for pain while in the hospital.   Discharge Vitals:   BP 85/41 mmHg  Pulse 85  Temp(Src) 97.9 F (36.6 C) (Oral)  Resp 20  Ht 5\' 3"  (1.6 m)  Wt 108 lb 3.2 oz (49.079 kg)  BMI 19.17 kg/m2  SpO2 98% Physical Exam General: elderly frail woman sitting up in chair, NAD HEENT: [place]/AT, EOMI, mucus membranes moist CV: RRR, 3/6 holosystolic murmur Pulm: CTA bilaterally, breathing comfortably on 4 L oxygen via [place] Abd: BS+, soft, non-tender Ext: warm, no edema  Neuro: alert and oriented x 3, no focal deficits  Discharge Labs:  No results found for this or any previous visit (from the past 24 hour(s)).  Signed: Juliet Rude, MD 03/01/2015, 2:56 PM    Services Ordered on Discharge: Home health PT and OT Equipment Ordered on Discharge: Home oxygen

## 2015-02-24 NOTE — Progress Notes (Signed)
Subjective: Patient states she did better overnight than she has been. She still notes occasional coughing. Per nursing notes, patient was noted to have inverted T waves that switched to AFib on tele and complained of 3/10 CP. EKG and troponin was obtained. EKG with sinus tachycardia and troponin negative. She denies any chest pain this morning. She is rate controlled.   Objective: Vital signs in last 24 hours: Filed Vitals:   02/23/15 2055 02/24/15 0018 02/24/15 0446 02/24/15 0745  BP:  90/54 108/60   Pulse: 50 78 81   Temp:  98.9 F (37.2 C) 97.8 F (36.6 C) 99 F (37.2 C)  TempSrc:  Oral Oral Oral  Resp: 31 18 29    Height:   5\' 3"  (1.6 m)   Weight:   108 lb 3.2 oz (49.079 kg)   SpO2: 94% 96% 91%    Weight change: -1 lb 1.6 oz (-0.499 kg)  Intake/Output Summary (Last 24 hours) at 02/24/15 1112 Last data filed at 02/24/15 0439  Gross per 24 hour  Intake    480 ml  Output    450 ml  Net     30 ml   Physical Exam General: elderly woman sitting up in bed, NAD HEENT: [place]/AT, EOMI, mucus membranes moist  CV: RRR, 3/6 systolic murmur heard best at LUSB Pulm: CTA bilaterally, breaths non-labored on 4 L oxygen via [place] Abd: BS+, soft, non-tender Ext: warm, no edema Neuro: alert and oriented x 3, no focal deficits   Lab Results: Basic Metabolic Panel:  Recent Labs Lab 02/23/15 0347 02/24/15 0254  NA 133* 131*  K 3.8 3.4*  CL 97* 92*  CO2 28 30  GLUCOSE 123* 131*  BUN 11 10  CREATININE 0.76 0.82  CALCIUM 7.8* 7.6*   Liver Function Tests:  Recent Labs Lab 02/19/15 0840 02/20/15 0327  AST 65* 66*  ALT 34 52  ALKPHOS 97 114  BILITOT 1.4* 1.6*  PROT 5.7* 6.4*  ALBUMIN 2.8* 3.0*   CBC:  Recent Labs Lab 02/19/15 0840 02/19/15 0900  WBC 6.7  --   HGB 12.0 11.9*  HCT 35.7* 35.0*  MCV 96.7  --   PLT 152  --    Cardiac Enzymes:  Recent Labs Lab 02/19/15 1748 02/19/15 2212 02/23/15 2321  TROPONINI 0.13* 0.11* <0.03   CBG:  Recent Labs Lab  02/19/15 0849  GLUCAP 124*   Thyroid Function Tests:  Recent Labs Lab 02/19/15 1800  TSH 2.463   Coagulation:  Recent Labs Lab 02/19/15 0840  LABPROT 16.0*  INR 1.26   Urinalysis:  Recent Labs Lab 02/21/15 1048  COLORURINE AMBER*  LABSPEC 1.015  PHURINE 5.5  GLUCOSEU NEGATIVE  HGBUR SMALL*  BILIRUBINUR NEGATIVE  KETONESUR NEGATIVE  PROTEINUR NEGATIVE  UROBILINOGEN 0.2  NITRITE NEGATIVE  LEUKOCYTESUR MODERATE*   Medications: I have reviewed the patient's current medications.  Assessment/Plan:  AFib with RVR s/p Cardioversion: Patient is rate controlled with HR in 90s-100s. On amiodarone 300 mg BID and Eliquis for anticoagulation.   - Cardiology following, appreciate recommendations - Continue Eliquis 2.5 mg BID - Continue Amiodarone 300 mg BID - f/u appt with Dr. Victorino December office   Combined Systolic and Diastolic HF: Echo showed EF 35-70%, grade 2 diastolic dysfunction, moderate-severe MR, mitral valve prolapse, and moderate pulmonary HTN (PAP 48). She is having dyspnea and cough occasionally, but seems to have improved with extra Lasix in evening. She is still requiring oxygen supplementation. Will need to have home oxygen. No crackles on exam.  No other signs of HF.  - Continue Lisinopril 5 mg daily - Change Lasix 40 mg in AM and 40 mg in evening  - Home oxygen ordered  - Guaifenesin for cough PRN  Hypokalemia: K 3.3 this morning. Replaced with K-dur 40 mEq. This is secondary to Lasix. She may require potassium supplementation. She has a follow up appointment for next week (May 10th) with her PCP and will need a repeat bmet. - K repleted - bmet as outpatient   Hypotension-Improving: BP stable in 226J-335K systolic.  - Continue to monitor   Asymptomatic Bacteriuria: UA with 20-50 WBCs, many bacteria, and positive leukocytes, but patient completely asymptomatic. No CVA or abdominal pain on exam. Will not treat as therapy not indicated.   Right Knee Pain:  Patient complaining of right knee pain that she had prior to admission while working in her garden. She had taken prednisone prior to admission which helped her pain. She states the heading pads and tylenol help her pain currently.  - Continue heating pads and tylenol PRN  Elevated LFTs: AST 65, TBili 1.4 on admission. Repeat today shows AST 66, TBili 1.6. Patient does not have any abdominal symptoms and no tenderness on exam. Patient denies any alcohol use. Will have her follow up with her PCP.   GERD: Patient takes Prilosec 20 mg daily. - Continue home Prilosec   Likely Osteopenia: Presumed diagnosis since patient is taking Fosamax 70 mg Qweekly. She is also on calcium and vitamin D supplementation.  - Continue home multivitamin  - PT/OT consult>> home health PT and OT ordered   Scoliosis: Patient reported and noted on CXR. Patient takes Norco 5-325 mg Q4H PRN pain at home.  - Patient has not required Norco so far. Can give if pain significant and BP can tolerate  Diet: Regular  VTE PPx: Eliquis  Dispo: Discharge likely today  The patient does have a current PCP (Leonides Sake, MD) and does need an [facility] hospital follow-up appointment after discharge.  The patient does not have transportation limitations that hinder transportation to clinic appointments.  .Services Needed at time of discharge: Y = Yes, Blank = No PT:   OT:   RN:   Equipment:   Other:     LOS: 3 days   Juliet Rude, MD 02/24/2015, 11:12 AM

## 2015-02-25 LAB — BASIC METABOLIC PANEL
Anion gap: 8 (ref 5–15)
BUN: 12 mg/dL (ref 6–20)
CO2: 31 mmol/L (ref 22–32)
Calcium: 7.9 mg/dL — ABNORMAL LOW (ref 8.9–10.3)
Chloride: 92 mmol/L — ABNORMAL LOW (ref 101–111)
Creatinine, Ser: 0.83 mg/dL (ref 0.44–1.00)
GFR calc Af Amer: >60 mL/min (ref >60)
GFR calc non Af Amer: >60 mL/min (ref >60)
Glucose, Bld: 108 mg/dL — ABNORMAL HIGH (ref 70–99)
Potassium: 4 mmol/L (ref 3.5–5.1)
Sodium: 131 mmol/L — ABNORMAL LOW (ref 135–145)

## 2015-02-25 MED ORDER — GUAIFENESIN-CODEINE 100-10 MG/5ML PO SOLN
5.0000 mL | ORAL | Status: DC | PRN
  Administered 2015-02-25: 5 mL via ORAL
  Filled 2015-02-25: qty 5

## 2015-02-25 MED ORDER — LISINOPRIL 2.5 MG PO TABS: 2.5000 mg | ORAL_TABLET | Freq: Every day | ORAL | Status: DC

## 2015-02-25 MED ORDER — POTASSIUM CHLORIDE CRYS ER 20 MEQ PO TBCR: 40.0000 meq | EXTENDED_RELEASE_TABLET | Freq: Every day | ORAL | Status: DC

## 2015-02-25 MED ORDER — FUROSEMIDE 10 MG/ML IJ SOLN
20.0000 mg | Freq: Once | INTRAMUSCULAR | Status: AC
  Administered 2015-02-25: 20 mg via INTRAVENOUS
  Filled 2015-02-25: qty 2

## 2015-02-25 NOTE — Progress Notes (Signed)
Patient Name: Christina Buckley Date of Encounter: 02/25/2015  Principal Problem:   Atrial fibrillation with RVR Active Problems:   Hypotension   GERD (gastroesophageal reflux disease)   Mitral valve prolapse   Severe mitral insufficiency   Acute on chronic combined systolic and diastolic congestive heart failure   Primary Cardiologist: Dr Sallyanne Kuster  Patient Profile: 81 yo female w/ hx GERD, MR, osteoporosis, admitted 05/01 w/ afib RVR, SOB. S/p DCCV in ER, on Eliquis, cards following for CHF, EF 40-45%, grade 2 diast dysf.   SUBJECTIVE: Her breathing is improved today but she remains on oxygen because of hypoxia.  She is desirous of going home.  Weights have been somewhat variable.  OBJECTIVE Filed Vitals:   02/24/15 2110 02/24/15 2310 02/25/15 0025 02/25/15 0340  BP: 109/57 91/46 106/64 93/48  Pulse: 92 83 88 83  Temp:  98.2 F (36.8 C)  98.5 F (36.9 C)  TempSrc:  Oral  Oral  Resp: 23 23 34 19  Height:      Weight:      SpO2: 93% 94% 93% 91%    Intake/Output Summary (Last 24 hours) at 02/25/15 0805 Last data filed at 02/25/15 7353  Gross per 24 hour  Intake     78 ml  Output   1850 ml  Net  -1772 ml   Filed Weights   02/22/15 0339 02/23/15 0440 02/24/15 0446  Weight: 106 lb 12.4 oz (48.432 kg) 109 lb 4.8 oz (49.578 kg) 108 lb 3.2 oz (49.079 kg)    PHYSICAL EXAM General: Pleasant elderly white female in no acute distress  Head: Normocephalic, atraumatic.  Neck: Supple without bruits, JVD minimal elevation. Lungs:  Resp regular and unlabored, decreased BS bases w/ few rales. Heart: RRR, S1, S2, no S3, S4, 3/6 murmur; no rub. Abdomen: Soft, non-tender, non-distended, BS + x 4.  Extremities: No clubbing, cyanosis, no edema.  Neuro: Alert and oriented X 3. Moves all extremities spontaneously. MS: Kyphoscoliosis noted.  LABS: Basic Metabolic Panel: Recent Labs  02/24/15 0254 02/25/15 0622  NA 131* 131*  K 3.4* 4.0  CL 92* 92*  CO2 30 31  GLUCOSE  131* 108*  BUN 10 12  CREATININE 0.82 0.83  CALCIUM 7.6* 7.9*   Cardiac Enzymes: Recent Labs  02/23/15 2321  TROPONINI <0.03   BNP:  B NATRIURETIC PEPTIDE  Date/Time Value Ref Range Status  02/23/2015 03:41 AM 299.9* 0.0 - 100.0 pg/mL Final   TELE:  Normal sinus rhythm with occasional PACs noted.  No recurrence of atrial fibrillation.   Current Medications:  . amiodarone  300 mg Oral BID  . apixaban  2.5 mg Oral Q12H  . furosemide  40 mg Oral Daily  . lisinopril  5 mg Oral BID  .  morphine injection  2 mg Intravenous Once  . multivitamin with minerals  1 tablet Oral Daily  . pantoprazole  40 mg Oral Daily  . sodium chloride  500 mL Intravenous Once  . sodium chloride  3 mL Intravenous Q12H      ASSESSMENT AND PLAN: Principal Problem:   Atrial fibrillation with RVR - s/p DCCV, maintaining SR  - continue amiodarone, apixaban - BP too low for BB/CCB now, if lisinopril 5 mg bid decreased, perhaps could add low-dose Coreg  Otherwise, per IM. Will order ambulation w/ O2 sats to make sure she does not need home O2. Active Problems:   Hypotension-currently better   GERD (gastroesophageal reflux disease)   Mitral valve prolapse  Severe mitral insufficiency   Acute on chronic combined systolic and diastolic congestive heart failure-appears to be improved  Recommendations:  She is maintaining sinus rhythm and wants to go home.  Okay to go home from a cardiac viewpoint on home oxygen, she will need home health care follow-up and close follow-up.  Would send home on amiodarone 300 mg twice daily for 2 more weeks and then 200 mg twice daily.  Will need early cardiac follow-up in one week  W. Tollie Eth, Brooke Bonito. MD [facility]  8:05 AM 02/25/2015

## 2015-02-25 NOTE — Progress Notes (Signed)
Subjective: Patient had dyspnea last night and required extra dose of Lasix 20 mg IV. She states she is breathing better this morning. BP low at 85/41 this AM so lisinopril was held. She is adamant about going home.   Objective: Vital signs in last 24 hours: Filed Vitals:   02/24/15 2310 02/25/15 0025 02/25/15 0340 02/25/15 0830  BP: 91/46 106/64 93/48 85/41   Pulse: 83 88 83 85  Temp: 98.2 F (36.8 C)  98.5 F (36.9 C) 97.9 F (36.6 C)  TempSrc: Oral  Oral Oral  Resp: 23 34 19 20  Height:      Weight:      SpO2: 94% 93% 91% 98%   Weight change:   Intake/Output Summary (Last 24 hours) at 02/25/15 1017 Last data filed at 02/25/15 0832  Gross per 24 hour  Intake    278 ml  Output   1600 ml  Net  -1322 ml   Physical Exam General: elderly frail woman sitting up in chair, NAD HEENT: [place]/AT, EOMI, mucus membranes moist CV: RRR, 3/6 holosystolic murmur Pulm: CTA bilaterally, breathing comfortably on 4 L oxygen via [place] Abd: BS+, soft, non-tender Ext: warm, no edema  Neuro: alert and oriented x 3, no focal deficits  Lab Results: Basic Metabolic Panel:  Recent Labs Lab 02/24/15 0254 02/25/15 0622  NA 131* 131*  K 3.4* 4.0  CL 92* 92*  CO2 30 31  GLUCOSE 131* 108*  BUN 10 12  CREATININE 0.82 0.83  CALCIUM 7.6* 7.9*   Liver Function Tests:  Recent Labs Lab 02/19/15 0840 02/20/15 0327  AST 65* 66*  ALT 34 52  ALKPHOS 97 114  BILITOT 1.4* 1.6*  PROT 5.7* 6.4*  ALBUMIN 2.8* 3.0*   CBC:  Recent Labs Lab 02/19/15 0840 02/19/15 0900  WBC 6.7  --   HGB 12.0 11.9*  HCT 35.7* 35.0*  MCV 96.7  --   PLT 152  --    Cardiac Enzymes:  Recent Labs Lab 02/19/15 1748 02/19/15 2212 02/23/15 2321  TROPONINI 0.13* 0.11* <0.03   CBG:  Recent Labs Lab 02/19/15 0849  GLUCAP 124*   Thyroid Function Tests:  Recent Labs Lab 02/19/15 1800  TSH 2.463   Coagulation:  Recent Labs Lab 02/19/15 0840  LABPROT 16.0*  INR 1.26   Urinalysis:  Recent  Labs Lab 02/21/15 1048  COLORURINE AMBER*  LABSPEC 1.015  PHURINE 5.5  GLUCOSEU NEGATIVE  HGBUR SMALL*  BILIRUBINUR NEGATIVE  KETONESUR NEGATIVE  PROTEINUR NEGATIVE  UROBILINOGEN 0.2  NITRITE NEGATIVE  LEUKOCYTESUR MODERATE*   Medications: I have reviewed the patient's current medications.  Assessment/Plan:  AFib with RVR s/p Cardioversion: Patient is rate controlled with HR in 90s-100s. On amiodarone 300 mg BID and Eliquis for anticoagulation.Will discharge home today and have cardiology follow up in 1 week.  - Cardiology following, appreciate recommendations - Continue Eliquis 2.5 mg BID - Continue Amiodarone 300 mg BID, Dr. Wynonia Lawman recommending to change amio to 200 mg BID in 2 weeks  - f/u appt with Dr. Victorino December office   Combined Systolic and Diastolic HF: Echo showed EF 16-10%, grade 2 diastolic dysfunction, moderate-severe MR, mitral valve prolapse, and moderate pulmonary HTN (PAP 48). She continues to have dyspnea and cough. She is still requiring oxygen supplementation. Will need to have home oxygen. No crackles on exam. No other signs of HF.  - Decrease Lisinopril to 2.5 mg daily - Continue Lasix 40 mg in AM and 40 mg in evening  - Discharge on  K supplementation 40 mEq daily  - Home oxygen ordered  - Guaifenesin for cough PRN  Hypokalemia: K improved to 4.0 this morning. Her hypokalemia is secondary to Lasix. Will discharge on K supplementation 40 mEq daily.  - bmet at outpatient visit   Hypotension: BP low this AM at 85/41. AM lisinopril held. Will decrease lisinopril to 2.5 gm daily upon discharge.  - Continue to monitor   Asymptomatic Bacteriuria: UA with 20-50 WBCs, many bacteria, and positive leukocytes, but patient completely asymptomatic. No CVA or abdominal pain on exam. Will not treat as therapy not indicated.   Right Knee Pain: Patient complaining of right knee pain that she had prior to admission while working in her garden. She had taken prednisone  prior to admission which helped her pain. She states the heading pads and tylenol help her pain currently.  - Continue heating pads and tylenol PRN  Elevated LFTs: AST 65, TBili 1.4 on admission. Repeat today shows AST 66, TBili 1.6. Patient does not have any abdominal symptoms and no tenderness on exam. Patient denies any alcohol use. Will have her follow up with her PCP.   GERD: Patient takes Prilosec 20 mg daily. - Continue home Prilosec   Likely Osteopenia: Presumed diagnosis since patient is taking Fosamax 70 mg Qweekly. She is also on calcium and vitamin D supplementation.  - Continue home multivitamin  - PT/OT consult>> home health PT and OT ordered   Scoliosis: Patient reported and noted on CXR. Patient takes Norco 5-325 mg Q4H PRN pain at home.  - Patient has not required Norco so far. Can give if pain significant and BP can tolerate  Diet: Regular VTE PPx: Eliquis  Dispo: Discharge today   The patient does have a current PCP (Leonides Sake, MD) and does need an [facility] hospital follow-up appointment after discharge.  The patient does not have transportation limitations that hinder transportation to clinic appointments.  .Services Needed at time of discharge: Y = Yes, Blank = No PT:   OT:   RN:   Equipment:   Other:     LOS: 4 days   Juliet Rude, MD 02/25/2015, 10:17 AM

## 2015-02-25 NOTE — Progress Notes (Signed)
Called by RN re: SBP 86 She is to get lisinopril 5 mg and Lasix 40 mg po  Will decrease lisinopril to 2.5 mg and make it once daily. Recheck BP in a little while and try to get Lasix on board. May have to decrease that also.  Rosaria Ferries, Hershal Coria 02/25/2015 9:32 AM Beeper [phone]

## 2015-02-25 NOTE — Progress Notes (Signed)
SATURATION QUALIFICATIONS: (This note is used to comply with regulatory documentation for home oxygen)  Patient Saturations on Room Air at Rest = 85%  Patient Saturations on Room Air while Ambulating = 76%  Patient Saturations on 4 Liters of oxygen while Ambulating = 94%  Please briefly explain why patient needs home oxygen:Pt. desats without O2 and maintains 90% or greater on 3L while at rest but in order to keep SAO2 greater than 90% while ambulating must be on 4L

## 2015-02-25 NOTE — Care Management Note (Signed)
Case Management Note  Patient Details  Name: Christina Buckley MRN: [id number] Date of Birth: Apr 26, 1924  Subjective/Objective:                   shortness of breath and palpitations found to be in atrial fibrillation with RVR with HR in 160s. Action/Plan:  Discharge planning Expected Discharge Date:  02/25/15               Expected Discharge Plan:  Home/Self Care  In-House Referral:     Discharge planning Services  CM Consult, Medication Assistance  Post Acute Care Choice:  Home Health Choice offered to:  Patient  DME Arranged:  Oxygen DME Agency:  [place] Arranged:  RN, PT, OT [facility] Agency:  [place]  Status of Service:  Completed, signed off  Medicare Important Message Given:    Date Medicare IM Given:    Medicare IM give by:    Date Additional Medicare IM Given:    Additional Medicare Important Message give by:     If discussed at [place] of Stay Meetings, dates discussed:    Additional Comments: CM met with pt and family in room to offer choice of home health agency.  Pt chooses [facility] to provide home 02 and provide HHPT/OT/RN.  Address and contact information verified by pt.  Pt has Eliquis 30 day free trial card (given by previous CM).  CM called [facility] DME rep, Germaine to please deliver the oxygen tank to room prior to dishcharge today.  Referral called to [facility] rep, Tiffany.  No other CM needs were communicated.  Dellie Catholic, RN 02/25/2015, 11:43 AM

## 2015-02-25 NOTE — Progress Notes (Signed)
Pt awakened with c/o SOB Bilateral breath sounds with basilar rales RLL. Diminished breath sounds bases. O2 sat 93%  On 4L N/C .RR 24 Internal Medicine paged.

## 2015-02-25 NOTE — Progress Notes (Signed)
Received return call from Dr. Raelene Bott. Made aware of increasing SOB, present VS & evaluation of breath sounds. New orders received and acknowledged.

## 2015-02-25 NOTE — Progress Notes (Signed)
IMTS Night Float Progress Note  Patient with increased cough with slightly increased work of breathing. Patient currently on Lasix 40 mg daily and received a extra 40 mg on 02/24/2015 at 12:42 PM. Net output of -1.5 L over the last 24 hours. Patient is satting 93% on 4 L nasal cannula. Cardiac exam remarkable for 3/6 holosystolic murmur, stable from priors. Lung exam with bibasilar rales right greater than left. No lower extremity edema.  -Robitussin-AC reordered -Extra Lasix 20 mg IV now.  Luan Moore, MD, PhD 02/25/15 3:53 AM

## 2015-02-27 DIAGNOSIS — R531 Weakness: Secondary | ICD-10-CM | POA: Diagnosis not present

## 2015-02-27 DIAGNOSIS — I4891 Unspecified atrial fibrillation: Secondary | ICD-10-CM | POA: Diagnosis not present

## 2015-02-27 DIAGNOSIS — M419 Scoliosis, unspecified: Secondary | ICD-10-CM | POA: Diagnosis not present

## 2015-02-27 DIAGNOSIS — I340 Nonrheumatic mitral (valve) insufficiency: Secondary | ICD-10-CM | POA: Diagnosis not present

## 2015-02-27 DIAGNOSIS — I5043 Acute on chronic combined systolic (congestive) and diastolic (congestive) heart failure: Secondary | ICD-10-CM | POA: Diagnosis not present

## 2015-02-27 DIAGNOSIS — I270 Primary pulmonary hypertension: Secondary | ICD-10-CM | POA: Diagnosis not present

## 2015-02-28 DIAGNOSIS — I270 Primary pulmonary hypertension: Secondary | ICD-10-CM | POA: Diagnosis not present

## 2015-02-28 DIAGNOSIS — M419 Scoliosis, unspecified: Secondary | ICD-10-CM | POA: Diagnosis not present

## 2015-02-28 DIAGNOSIS — I4891 Unspecified atrial fibrillation: Secondary | ICD-10-CM | POA: Diagnosis not present

## 2015-02-28 DIAGNOSIS — I5043 Acute on chronic combined systolic (congestive) and diastolic (congestive) heart failure: Secondary | ICD-10-CM | POA: Diagnosis not present

## 2015-02-28 DIAGNOSIS — I340 Nonrheumatic mitral (valve) insufficiency: Secondary | ICD-10-CM | POA: Diagnosis not present

## 2015-02-28 DIAGNOSIS — Z6820 Body mass index (BMI) 20.0-20.9, adult: Secondary | ICD-10-CM | POA: Diagnosis not present

## 2015-02-28 DIAGNOSIS — E876 Hypokalemia: Secondary | ICD-10-CM | POA: Diagnosis not present

## 2015-02-28 DIAGNOSIS — R531 Weakness: Secondary | ICD-10-CM | POA: Diagnosis not present

## 2015-02-28 DIAGNOSIS — E8809 Other disorders of plasma-protein metabolism, not elsewhere classified: Secondary | ICD-10-CM | POA: Diagnosis not present

## 2015-03-01 DIAGNOSIS — I4891 Unspecified atrial fibrillation: Secondary | ICD-10-CM | POA: Diagnosis not present

## 2015-03-01 DIAGNOSIS — R531 Weakness: Secondary | ICD-10-CM | POA: Diagnosis not present

## 2015-03-01 DIAGNOSIS — I340 Nonrheumatic mitral (valve) insufficiency: Secondary | ICD-10-CM | POA: Diagnosis not present

## 2015-03-01 DIAGNOSIS — I270 Primary pulmonary hypertension: Secondary | ICD-10-CM | POA: Diagnosis not present

## 2015-03-01 DIAGNOSIS — I5043 Acute on chronic combined systolic (congestive) and diastolic (congestive) heart failure: Secondary | ICD-10-CM | POA: Diagnosis not present

## 2015-03-01 DIAGNOSIS — M419 Scoliosis, unspecified: Secondary | ICD-10-CM | POA: Diagnosis not present

## 2015-03-03 DIAGNOSIS — I270 Primary pulmonary hypertension: Secondary | ICD-10-CM | POA: Diagnosis not present

## 2015-03-03 DIAGNOSIS — I5043 Acute on chronic combined systolic (congestive) and diastolic (congestive) heart failure: Secondary | ICD-10-CM | POA: Diagnosis not present

## 2015-03-03 DIAGNOSIS — M419 Scoliosis, unspecified: Secondary | ICD-10-CM | POA: Diagnosis not present

## 2015-03-03 DIAGNOSIS — I4891 Unspecified atrial fibrillation: Secondary | ICD-10-CM | POA: Diagnosis not present

## 2015-03-03 DIAGNOSIS — I340 Nonrheumatic mitral (valve) insufficiency: Secondary | ICD-10-CM | POA: Diagnosis not present

## 2015-03-03 DIAGNOSIS — R531 Weakness: Secondary | ICD-10-CM | POA: Diagnosis not present

## 2015-03-06 DIAGNOSIS — I270 Primary pulmonary hypertension: Secondary | ICD-10-CM | POA: Diagnosis not present

## 2015-03-06 DIAGNOSIS — M419 Scoliosis, unspecified: Secondary | ICD-10-CM | POA: Diagnosis not present

## 2015-03-06 DIAGNOSIS — R531 Weakness: Secondary | ICD-10-CM | POA: Diagnosis not present

## 2015-03-06 DIAGNOSIS — I340 Nonrheumatic mitral (valve) insufficiency: Secondary | ICD-10-CM | POA: Diagnosis not present

## 2015-03-06 DIAGNOSIS — I5043 Acute on chronic combined systolic (congestive) and diastolic (congestive) heart failure: Secondary | ICD-10-CM | POA: Diagnosis not present

## 2015-03-06 DIAGNOSIS — I4891 Unspecified atrial fibrillation: Secondary | ICD-10-CM | POA: Diagnosis not present

## 2015-03-08 DIAGNOSIS — M419 Scoliosis, unspecified: Secondary | ICD-10-CM | POA: Diagnosis not present

## 2015-03-08 DIAGNOSIS — R531 Weakness: Secondary | ICD-10-CM | POA: Diagnosis not present

## 2015-03-08 DIAGNOSIS — I270 Primary pulmonary hypertension: Secondary | ICD-10-CM | POA: Diagnosis not present

## 2015-03-08 DIAGNOSIS — I5043 Acute on chronic combined systolic (congestive) and diastolic (congestive) heart failure: Secondary | ICD-10-CM | POA: Diagnosis not present

## 2015-03-08 DIAGNOSIS — I4891 Unspecified atrial fibrillation: Secondary | ICD-10-CM | POA: Diagnosis not present

## 2015-03-08 DIAGNOSIS — I340 Nonrheumatic mitral (valve) insufficiency: Secondary | ICD-10-CM | POA: Diagnosis not present

## 2015-03-10 DIAGNOSIS — M419 Scoliosis, unspecified: Secondary | ICD-10-CM | POA: Diagnosis not present

## 2015-03-10 DIAGNOSIS — R531 Weakness: Secondary | ICD-10-CM | POA: Diagnosis not present

## 2015-03-10 DIAGNOSIS — I5043 Acute on chronic combined systolic (congestive) and diastolic (congestive) heart failure: Secondary | ICD-10-CM | POA: Diagnosis not present

## 2015-03-10 DIAGNOSIS — I270 Primary pulmonary hypertension: Secondary | ICD-10-CM | POA: Diagnosis not present

## 2015-03-10 DIAGNOSIS — I340 Nonrheumatic mitral (valve) insufficiency: Secondary | ICD-10-CM | POA: Diagnosis not present

## 2015-03-10 DIAGNOSIS — I4891 Unspecified atrial fibrillation: Secondary | ICD-10-CM | POA: Diagnosis not present

## 2015-03-13 ENCOUNTER — Telehealth: Payer: Self-pay | Admitting: Cardiology

## 2015-03-13 DIAGNOSIS — I270 Primary pulmonary hypertension: Secondary | ICD-10-CM | POA: Diagnosis not present

## 2015-03-13 DIAGNOSIS — I340 Nonrheumatic mitral (valve) insufficiency: Secondary | ICD-10-CM | POA: Diagnosis not present

## 2015-03-13 DIAGNOSIS — M419 Scoliosis, unspecified: Secondary | ICD-10-CM | POA: Diagnosis not present

## 2015-03-13 DIAGNOSIS — R531 Weakness: Secondary | ICD-10-CM | POA: Diagnosis not present

## 2015-03-13 DIAGNOSIS — I4891 Unspecified atrial fibrillation: Secondary | ICD-10-CM | POA: Diagnosis not present

## 2015-03-13 DIAGNOSIS — I5043 Acute on chronic combined systolic (congestive) and diastolic (congestive) heart failure: Secondary | ICD-10-CM | POA: Diagnosis not present

## 2015-03-13 NOTE — Telephone Encounter (Signed)
Spoke to daughter Vaughan Basta). She's concerned that her mother may be taking to much medication for age and weight. She states patient has been having problems with being weak nauseated,dry heaves. Patient decrease dosage yesterday to 200 mg twice a day. RN spoke to [facility] RN informed daughter an appointment made with AFIB CLINIC- 03/15/15 1:30 PM with Roderic Palau NP DIRECTION GIVEN ( [place] FROM HF CLINIC) , CODE TO [place] [phone].  DAUGHTER VERBALIZED UNDERSTANDING.

## 2015-03-13 NOTE — Telephone Encounter (Signed)
Pt is calling in wanting to know if she has to take the Amiodarone. Please call  Thanks

## 2015-03-15 ENCOUNTER — Other Ambulatory Visit (HOSPITAL_COMMUNITY): Payer: Self-pay | Admitting: *Deleted

## 2015-03-15 ENCOUNTER — Ambulatory Visit (HOSPITAL_COMMUNITY)
Admission: RE | Admit: 2015-03-15 | Discharge: 2015-03-15 | Disposition: A | Discharge status: Home / Self Care | Payer: Medicare Other | Source: Ambulatory Visit | Attending: Nurse Practitioner | Admitting: Nurse Practitioner

## 2015-03-15 ENCOUNTER — Encounter (HOSPITAL_COMMUNITY): Payer: Self-pay | Admitting: Nurse Practitioner

## 2015-03-15 VITALS — BP 138/80 | HR 83 | Ht 60.0 in | Wt 105.6 lb

## 2015-03-15 DIAGNOSIS — I4819 Other persistent atrial fibrillation: Secondary | ICD-10-CM

## 2015-03-15 DIAGNOSIS — I5042 Chronic combined systolic (congestive) and diastolic (congestive) heart failure: Secondary | ICD-10-CM | POA: Insufficient documentation

## 2015-03-15 DIAGNOSIS — I481 Persistent atrial fibrillation: Secondary | ICD-10-CM | POA: Insufficient documentation

## 2015-03-15 LAB — CBC
HEMATOCRIT: 38.4 % (ref 36.0–46.0)
HEMOGLOBIN: 12.9 g/dL (ref 12.0–15.0)
MCH: 32.2 pg (ref 26.0–34.0)
MCHC: 33.6 g/dL (ref 30.0–36.0)
MCV: 95.8 fL (ref 78.0–100.0)
PLATELETS: 242 10*3/uL (ref 150–400)
RBC: 4.01 MIL/uL (ref 3.87–5.11)
RDW: 13.7 % (ref 11.5–15.5)
WBC: 5.8 10*3/uL (ref 4.0–10.5)

## 2015-03-15 LAB — HEPATIC FUNCTION PANEL
ALK PHOS: 79 U/L (ref 38–126)
ALT: 14 U/L (ref 14–54)
AST: 22 U/L (ref 15–41)
Albumin: 3 g/dL — ABNORMAL LOW (ref 3.5–5.0)
BILIRUBIN DIRECT: 0.1 mg/dL (ref 0.1–0.5)
Indirect Bilirubin: 0.8 mg/dL (ref 0.3–0.9)
Total Bilirubin: 0.9 mg/dL (ref 0.3–1.2)
Total Protein: 6.6 g/dL (ref 6.5–8.1)

## 2015-03-15 LAB — BASIC METABOLIC PANEL
Anion gap: 7 (ref 5–15)
BUN: 11 mg/dL (ref 6–20)
CO2: 28 mmol/L (ref 22–32)
Calcium: 8.5 mg/dL — ABNORMAL LOW (ref 8.9–10.3)
Chloride: 100 mmol/L — ABNORMAL LOW (ref 101–111)
Creatinine, Ser: 0.73 mg/dL (ref 0.44–1.00)
GFR calc Af Amer: >60 mL/min (ref >60)
GFR calc non Af Amer: >60 mL/min (ref >60)
Glucose, Bld: 106 mg/dL — ABNORMAL HIGH (ref 65–99)
POTASSIUM: 4.1 mmol/L (ref 3.5–5.1)
Sodium: 135 mmol/L (ref 135–145)

## 2015-03-15 MED ORDER — APIXABAN 2.5 MG PO TABS: 2.5000 mg | ORAL_TABLET | Freq: Two times a day (BID) | ORAL | Status: DC

## 2015-03-15 MED ORDER — AMIODARONE HCL 200 MG PO TABS: 200.0000 mg | ORAL_TABLET | Freq: Every day | ORAL | Status: DC

## 2015-03-15 NOTE — Patient Instructions (Signed)
Your physician has recommended you make the following change in your medication:  1)Decrease Amiodarone to 200mg  once a day 2)Stop Aspirin 3)Stop fish oil  Follow up with afib clinic as needed. We will let you know regarding appointment with Dr. Renaee Munda early next week.

## 2015-03-15 NOTE — Progress Notes (Signed)
Patient ID: Christina Buckley, female   DOB: 1924/04/16, 81 y.o.   MRN: [id number]     Primary Care Physician: Leonides Sake, MD Referring Physician: [facility] F/U   Christina Buckley is a 81 y.o. female with a h/o admission to [facility] 5/ with Afib with RVR and combined sys/dia Heart failure, hypokalemia, discharged 5/11. Family reports that she converted to sinus rhythm within a week of d/c. Developed some n/v and when seen by PCP last week,  lasix and Kt were d/ced. These symptoms persisted so the daughter decreased the amiodarone to 200 mg bid x 2 days and then held for the last 2 days. She has not any further N/V. Ekg shows SR. Has not noticed any more irregular heart beat, no chest pain. Weight has been stable off lasix. No PND/Orthopnea/pedal edema. Wears O2 at night but has not required during the day.  Today, she denies symptoms of palpitations, chest pain, shortness of breath, orthopnea, PND, lower extremity edema, dizziness, presyncope, syncope, or neurologic sequela. Positive for n/v, resolved The patient is tolerating medications without difficulties and is otherwise without complaint today.   Past Medical History  Diagnosis Date  . GERD (gastroesophageal reflux disease)   . Scoliosis   . Heart murmur    No past surgical history on file.  Current Outpatient Prescriptions  Medication Sig Dispense Refill  . acetaminophen (TYLENOL) 325 MG tablet Take 650 mg by mouth daily as needed for mild pain.    Marland Kitchen alendronate (FOSAMAX) 70 MG tablet Take 70 mg by mouth once a week. Take on Saturdays  2  . amiodarone (PACERONE) 200 MG tablet Take 1 tablet (200 mg total) by mouth daily. 90 tablet 3  . apixaban (ELIQUIS) 2.5 MG TABS tablet Take 1 tablet (2.5 mg total) by mouth every 12 (twelve) hours. 60 tablet 0  . Calcium Carbonate-Vitamin D (CALCIUM + D PO) Take 1 tablet by mouth 2 (two) times daily.    Marland Kitchen lisinopril (PRINIVIL,ZESTRIL) 2.5 MG tablet Take 1 tablet (2.5 mg total) by mouth daily. 30 tablet 3   . Multiple Vitamin (MULTIVITAMIN WITH MINERALS) TABS Take 1 tablet by mouth daily.    Marland Kitchen omeprazole (PRILOSEC) 20 MG capsule Take 20 mg by mouth daily.    . Plant Sterols and Stanols (CHOLESTOFF PO) Take 1 capsule by mouth 2 (two) times daily.    . Red Yeast Rice 600 MG CAPS Take 1,200 mg by mouth 2 (two) times daily.     No current facility-administered medications for this encounter.    No Known Allergies  History   Social History  . Marital Status: Married    Spouse Name: N/A  . Number of Children: N/A  . Years of Education: N/A   Occupational History  . 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 Smoking status: Never Smoker   . Smokeless tobacco: Not on file  . Alcohol Use: Not on file  . Drug Use: Not on file  . Sexual Activity: Not on file   Other Topics Concern  . Not on file   Social History Narrative   Lives at home alone, husband passed away last year.  Has multiple children nearby who look in on her daily.    Family History  Problem Relation Age of Onset  . Heart attack Father     in his 25s  . Diabetes Mother   . Heart attack Mother  lived to 95  . Heart attack Paternal Grandfather     ROS- All systems are reviewed and negative except as per the HPI above  Physical Exam: Filed Vitals:   03/15/15 1337  BP: 138/80  Pulse: 83  Height: 5' (1.524 m)  Weight: 105 lb 9.6 oz (47.9 kg)   Pulse Ox 94% on roon air GEN- The patient is well appearing, alert and oriented x 3 today.   Head- normocephalic, atraumatic Eyes-  Sclera clear, conjunctiva pink Ears- hearing intact Oropharynx- clear Neck- supple, no JVP Lymph- no cervical lymphadenopathy Lungs- Clear to ausculation bilaterally, normal work of breathing, scoliosis present. Few crackles left base that cleared with deep breaths. Heart- Regular rate and rhythm, no murmurs, rubs or gallops, PMI not laterally displaced GI- soft, NT, ND, + BS Extremities- no  clubbing, cyanosis, or edema MS- no significant deformity or atrophy Skin- no rash or lesion Psych- euthymic mood, full affect Neuro- strength and sensation are intact  EKG-SR with 1st degree AV block, LAFB, 83 bpm. Epic records reviewed. Nextappt: None            Newer results are available. Click to view them now.          Ref Range 3wk ago (02/20/15) 3wk ago (02/19/15) 3wk ago (02/19/15) 1yr ago (09/30/12)    Sodium 135 - 145 mmol/L 137 139 138 138R    Potassium 3.5 - 5.1 mmol/L 3.6 3.7 3.7 3.6R    Chloride 101 - 111 mmol/L 102 105 108 102R    CO2 22 - 32 mmol/L 25  23 25R    Glucose, Bld 70 - 99 mg/dL 137 (H) 140 (H) 140 (H) 110 (H)    BUN 6 - 20 mg/dL 16 19 19  12R    Creatinine, Ser 0.44 - 1.00 mg/dL 0.80 0.70 0.83 0.45 (L)R    Calcium 8.9 - 10.3 mg/dL 8.0 (L)  7.7 (L) 8.7R    Total Protein 6.5 - 8.1 g/dL 6.4 (L)  5.7 (L) 7.1R    Albumin 3.5 - 5.0 g/dL 3.0 (L)  2.8 (L) 3.5R    AST 15 - 41 U/L 66 (H)  65 (H) 30R    ALT 14 - 54 U/L 52  34 16R    Alkaline Phosphatase 38 - 126 U/L 114  97 95R    Total Bilirubin 0.3 - 1.2 mg/dL 1.6 (H           02/19/15 3wk ago  63yr ago        WBC 4.0 - 10.5 K/uL 6.7 10.3    RBC 3.87 - 5.11 MIL/uL 3.69 (L) 4.00    Hemoglobin 12.0 - 15.0 g/dL 12.0 12.9    HCT 36.0 - 46.0 % 35.7 (L) 38.2    MCV 78.0 - 100.0 fL 96.7 95.5    MCH 26.0 - 34.0 pg 32.5 32.3    MCHC 30.0 - 36.0 g/dL 33.6 33.8    RDW 11.5 - 15.5 % 13.2 12.7    Platelets 150 - 400 K/uL 152           Assessment and Plan:  1. New onset AFIB with RVR  Maintaining SR but with n/v few days ago ? High dose amiodarone had been 300 mg bid x several weeks Decrease dose to 200 mg a day, contact the office if n/v resumes on this dose Amiodarone level to be drawn today Liver profile to be repeated due to elevated liver enzymes while in hospital  2. Combined systolic/diastolic  heart failure Lasix/potassium stopped by PCP due to possible  intolerance Continue lisinopril States PCP told her to restrict fluids due to hyponatremia on labs drawn 1 week earlier in [place] Weight has been stable and no further S/S of heart failure Bmet today  3. Chadsvasc score of at least 4 Tolerating [place], no bleeding concerns Some discussion that she might not be able to afford in the long run and may want to transition over to warfarin at some point.  CBC today. Stop baby asa Stop fish oil  She has f/u with her PCP within two weeks Will need f/u with cardiology and saw Dr. Sallyanne Kuster in the hospital and he was also her husbands cardiologist and wants to f/u with him. Will try to arrange f/u.

## 2015-03-16 ENCOUNTER — Telehealth: Payer: Self-pay | Admitting: Cardiovascular Disease

## 2015-03-16 LAB — AMIODARONE LEVEL
Amiodarone Lvl: 1.5 ug/mL (ref 1.0–2.5)
N-Desethyl-Amiodarone: 1.6 ug/mL (ref 1.0–2.5)

## 2015-03-16 NOTE — Telephone Encounter (Signed)
Closed encounter °

## 2015-03-17 DIAGNOSIS — I5043 Acute on chronic combined systolic (congestive) and diastolic (congestive) heart failure: Secondary | ICD-10-CM | POA: Diagnosis not present

## 2015-03-17 DIAGNOSIS — I270 Primary pulmonary hypertension: Secondary | ICD-10-CM | POA: Diagnosis not present

## 2015-03-17 DIAGNOSIS — R531 Weakness: Secondary | ICD-10-CM | POA: Diagnosis not present

## 2015-03-17 DIAGNOSIS — M419 Scoliosis, unspecified: Secondary | ICD-10-CM | POA: Diagnosis not present

## 2015-03-17 DIAGNOSIS — I340 Nonrheumatic mitral (valve) insufficiency: Secondary | ICD-10-CM | POA: Diagnosis not present

## 2015-03-17 DIAGNOSIS — I4891 Unspecified atrial fibrillation: Secondary | ICD-10-CM | POA: Diagnosis not present

## 2015-03-21 DIAGNOSIS — I270 Primary pulmonary hypertension: Secondary | ICD-10-CM | POA: Diagnosis not present

## 2015-03-21 DIAGNOSIS — I340 Nonrheumatic mitral (valve) insufficiency: Secondary | ICD-10-CM | POA: Diagnosis not present

## 2015-03-21 DIAGNOSIS — I4891 Unspecified atrial fibrillation: Secondary | ICD-10-CM | POA: Diagnosis not present

## 2015-03-21 DIAGNOSIS — M419 Scoliosis, unspecified: Secondary | ICD-10-CM | POA: Diagnosis not present

## 2015-03-21 DIAGNOSIS — I5043 Acute on chronic combined systolic (congestive) and diastolic (congestive) heart failure: Secondary | ICD-10-CM | POA: Diagnosis not present

## 2015-03-21 DIAGNOSIS — R531 Weakness: Secondary | ICD-10-CM | POA: Diagnosis not present

## 2015-03-23 DIAGNOSIS — I4891 Unspecified atrial fibrillation: Secondary | ICD-10-CM | POA: Diagnosis not present

## 2015-03-23 DIAGNOSIS — I270 Primary pulmonary hypertension: Secondary | ICD-10-CM | POA: Diagnosis not present

## 2015-03-23 DIAGNOSIS — I340 Nonrheumatic mitral (valve) insufficiency: Secondary | ICD-10-CM | POA: Diagnosis not present

## 2015-03-23 DIAGNOSIS — I5043 Acute on chronic combined systolic (congestive) and diastolic (congestive) heart failure: Secondary | ICD-10-CM | POA: Diagnosis not present

## 2015-03-23 DIAGNOSIS — M419 Scoliosis, unspecified: Secondary | ICD-10-CM | POA: Diagnosis not present

## 2015-03-23 DIAGNOSIS — R531 Weakness: Secondary | ICD-10-CM | POA: Diagnosis not present

## 2015-03-25 ENCOUNTER — Telehealth: Payer: Self-pay | Admitting: Cardiology

## 2015-03-25 NOTE — Telephone Encounter (Signed)
  Patient's daughter-in-law called stating the patient is back in atrial fibrillation. She was on amiodarone 200 mg daily but dose was reduced due to reports of nausea (patient not 100% sure amiodarone was the cause). She was reduced down to 100 mg daily last week. She feels tired but no dyspnea, syncope/ near syncope. She is not on any other medicines for afib other than Eliquis. She is willing to try to increase the dose back up to 200 mg since this dose kept her in NSR. She will retry 200 mg and will call if any additional problems. Also advised to call the office on Monday with an update.   Zya Finkle 03/25/2015

## 2015-03-27 DIAGNOSIS — I4891 Unspecified atrial fibrillation: Secondary | ICD-10-CM | POA: Diagnosis not present

## 2015-03-27 DIAGNOSIS — R112 Nausea with vomiting, unspecified: Secondary | ICD-10-CM | POA: Diagnosis not present

## 2015-03-27 DIAGNOSIS — Z681 Body mass index (BMI) 19 or less, adult: Secondary | ICD-10-CM | POA: Diagnosis not present

## 2015-03-29 ENCOUNTER — Other Ambulatory Visit: Payer: Self-pay

## 2015-03-29 ENCOUNTER — Ambulatory Visit (HOSPITAL_COMMUNITY)
Admission: RE | Admit: 2015-03-29 | Discharge: 2015-03-29 | Disposition: A | Discharge status: Home / Self Care | Payer: Medicare Other | Source: Ambulatory Visit | Attending: Nurse Practitioner | Admitting: Nurse Practitioner

## 2015-03-29 ENCOUNTER — Encounter (HOSPITAL_COMMUNITY): Payer: Self-pay | Admitting: Nurse Practitioner

## 2015-03-29 VITALS — BP 120/74 | HR 82 | Ht 60.0 in | Wt 102.6 lb

## 2015-03-29 DIAGNOSIS — I5040 Unspecified combined systolic (congestive) and diastolic (congestive) heart failure: Secondary | ICD-10-CM | POA: Insufficient documentation

## 2015-03-29 DIAGNOSIS — Z8249 Family history of ischemic heart disease and other diseases of the circulatory system: Secondary | ICD-10-CM | POA: Diagnosis not present

## 2015-03-29 DIAGNOSIS — K219 Gastro-esophageal reflux disease without esophagitis: Secondary | ICD-10-CM | POA: Insufficient documentation

## 2015-03-29 DIAGNOSIS — Z7902 Long term (current) use of antithrombotics/antiplatelets: Secondary | ICD-10-CM | POA: Insufficient documentation

## 2015-03-29 DIAGNOSIS — Z833 Family history of diabetes mellitus: Secondary | ICD-10-CM | POA: Diagnosis not present

## 2015-03-29 DIAGNOSIS — I4891 Unspecified atrial fibrillation: Secondary | ICD-10-CM | POA: Insufficient documentation

## 2015-03-29 DIAGNOSIS — I480 Paroxysmal atrial fibrillation: Secondary | ICD-10-CM | POA: Diagnosis not present

## 2015-03-29 DIAGNOSIS — Z79899 Other long term (current) drug therapy: Secondary | ICD-10-CM | POA: Diagnosis not present

## 2015-03-29 DIAGNOSIS — Z7983 Long term (current) use of bisphosphonates: Secondary | ICD-10-CM | POA: Insufficient documentation

## 2015-03-29 MED ORDER — FUROSEMIDE 20 MG PO TABS: ORAL_TABLET | ORAL | Status: DC

## 2015-03-29 MED ORDER — LISINOPRIL 2.5 MG PO TABS: 2.5000 mg | ORAL_TABLET | Freq: Every day | ORAL | Status: DC

## 2015-03-29 NOTE — Progress Notes (Signed)
Patient ID: Christina Buckley, female   DOB: 10/10/1924, 81 y.o.   MRN: [id number]     Primary Care Physician: Leonides Sake, MD Referring Physician: [facility] F/U   Christina Buckley is a 81 y.o. female with a h/o admission to [facility] 5/ with Afib with RVR and combined sys/dia Heart failure, hypokalemia, discharged 5/11. Family reports that she converted to sinus rhythm within a week of d/c. Developed some n/v and when seen by PCP,  lasix and Kt were d/ced. These symptoms persisted so the daughter decreased the amiodarone to 200 mg bid x 2 days, from 300 mg bid and then it was decreased to 100 mg daily when she had continued complaints of nausea/vomiting. Despite lower dose she still has some GI issues so it will be stopped today.  She still has some afib but usually  of short duration usually picked by daughter who is a Marine scientist. Pt not aware. Pt will probably have to be rate controlled due to  limited options to maintain SR.  No chest pain.  Has a very insufficient mitral valve but pt does not want surgery.Weight has been stable off lasix. No PND/orthopnea/pedal edema. Wears O2  moreso during the day lately and daughter has noticed drop in sats, indicating  she probably will require regular use of diuretic,to help with pulmonary congestion and breathing. Also,family is wanting to enroll her in hospice, which is a reasonable idea, to assist the family with  monitoring her care. Some family members are concerned that the daughter, who is a Marine scientist, is manipulating her drugs as she sees appropriate.  Today, she denies symptoms of palpitations, chest pain, , orthopnea, PND, lower extremity edema, dizziness, presyncope, syncope, or neurologic sequela. Positive for n/v.The patient is  is otherwise without complaint today.   Past Medical History  Diagnosis Date  . GERD (gastroesophageal reflux disease)   . Scoliosis   . Heart murmur    No past surgical history on file.  Current Outpatient Prescriptions  Medication  Sig Dispense Refill  . acetaminophen (TYLENOL) 325 MG tablet Take 650 mg by mouth daily as needed for mild pain.    Marland Kitchen alendronate (FOSAMAX) 70 MG tablet Take 70 mg by mouth once a week. Take on Saturdays  2  . apixaban (ELIQUIS) 2.5 MG TABS tablet Take 1 tablet (2.5 mg total) by mouth every 12 (twelve) hours. 60 tablet 3  . Calcium Carbonate-Vitamin D (CALCIUM + D PO) Take 1 tablet by mouth 2 (two) times daily.    Marland Kitchen lisinopril (PRINIVIL,ZESTRIL) 2.5 MG tablet Take 1 tablet (2.5 mg total) by mouth at bedtime. 30 tablet 3  . Multiple Vitamin (MULTIVITAMIN WITH MINERALS) TABS Take 1 tablet by mouth daily.    Marland Kitchen omeprazole (PRILOSEC) 20 MG capsule Take 20 mg by mouth daily.    . Plant Sterols and Stanols (CHOLESTOFF PO) Take 1 capsule by mouth 2 (two) times daily.    . Red Yeast Rice 600 MG CAPS Take 1,200 mg by mouth 2 (two) times daily.    . furosemide (LASIX) 20 MG tablet Take 1 tablet daily as needed when she has shortness of breath, cough, weight gain 30 tablet 1   No current facility-administered medications for this encounter.    No Known Allergies  History   Social History  . Marital Status: Married    Spouse Name: N/A  . Number of Children: N/A  . Years of Education: N/A   Occupational History  . 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 Smoking status: Never Smoker   . Smokeless tobacco: Not on file  . Alcohol Use: Not on file  . Drug Use: Not on file  . Sexual Activity: Not on file   Other Topics Concern  . Not on file   Social History Narrative   Lives at home alone, husband passed away last year.  Has multiple children nearby who look in on her daily.    Family History  Problem Relation Age of Onset  . Heart attack Father     in his 64s  . Diabetes Mother   . Heart attack Mother     lived to 35  . Heart attack Paternal Grandfather     ROS- All systems are reviewed and negative except as per the HPI above  Physical  Exam: Filed Vitals:   03/29/15 1146  BP: 120/74  Pulse: 82  Height: 5' (1.524 m)  Weight: 102 lb 9.6 oz (46.539 kg)   Pulse Ox 94% on roon air GEN- The patient is well appearing, alert and oriented x 3 today.   Head- normocephalic, atraumatic Eyes-  Sclera clear, conjunctiva pink Ears- hearing intact Oropharynx- clear Neck- supple, no JVP Lymph- no cervical lymphadenopathy Lungs- Clear to ausculation bilaterally, normal work of breathing, scoliosis present. Few crackles left base that cleared with deep breaths, diminished breath sounds in bases. Heart- Regular rate and rhythm, no murmurs, rubs or gallops, PMI not laterally displaced GI- soft, NT, ND, + BS Extremities- no clubbing, cyanosis, or edema MS- no significant deformity or atrophy Skin- no rash or lesion Psych- euthymic mood, full affect Neuro- strength and sensation are intact  EKG-SR at 82 bpm, NS t wave abnormality QRS 74 bpm, QTc 453 ms. Epic records reviewed. 5/26 Amiodarone Lvl 1.0 - 2.5 ug/mL 1.5   Comments:                Detection Limit = 0.2   N-Desethyl-Amiodarone 1.0 - 2.5 ug/mL 1.6          Nextappt: None            Newer results are available. Click to view them now.          Ref Range 3wk ago (02/20/15) 3wk ago (02/19/15) 3wk ago (02/19/15) 21yr ago (09/30/12)    Sodium 135 - 145 mmol/L 137 139 138 138R    Potassium 3.5 - 5.1 mmol/L 3.6 3.7 3.7 3.6R    Chloride 101 - 111 mmol/L 102 105 108 102R    CO2 22 - 32 mmol/L 25  23 25R    Glucose, Bld 70 - 99 mg/dL 137 (H) 140 (H) 140 (H) 110 (H)    BUN 6 - 20 mg/dL 16 19 19  12R    Creatinine, Ser 0.44 - 1.00 mg/dL 0.80 0.70 0.83 0.45 (L)R    Calcium 8.9 - 10.3 mg/dL 8.0 (L)  7.7 (L) 8.7R    Total Protein 6.5 - 8.1 g/dL 6.4 (L)  5.7 (L) 7.1R    Albumin 3.5 - 5.0 g/dL 3.0 (L)  2.8 (L) 3.5R    AST 15 - 41 U/L 66 (H)  65 (H) 30R    ALT 14 - 54 U/L 52  34 16R    Alkaline Phosphatase 38 - 126 U/L 114  97 95R     Total Bilirubin 0.3 - 1.2 mg/dL 1.6 (H           02/19/15 3wk ago  36yr  ago        WBC 4.0 - 10.5 K/uL 6.7 10.3    RBC 3.87 - 5.11 MIL/uL 3.69 (L) 4.00    Hemoglobin 12.0 - 15.0 g/dL 12.0 12.9    HCT 36.0 - 46.0 % 35.7 (L) 38.2    MCV 78.0 - 100.0 fL 96.7 95.5    MCH 26.0 - 34.0 pg 32.5 32.3    MCHC 30.0 - 36.0 g/dL 33.6 33.8    RDW 11.5 - 15.5 % 13.2 12.7    Platelets 150 - 400 K/uL 152         2wk ago (03/15/15) 40mo ago (02/20/15) 40mo ago (02/19/15)       Total Protein 6.5 - 8.1 g/dL 6.6 6.4 (L) 5.7 (L)    Albumin 3.5 - 5.0 g/dL 3.0 (L) 3.0 (L) 2.8 (L)    AST 15 - 41 U/L 22 66 (H) 65 (H)    ALT 14 - 54 U/L 14 52 34    Alkaline Phosphatase 38 - 126 U/L 79 114 97    Total Bilirubin 0.3 - 1.2 mg/dL 0.9 1.6 (H) 1.4 (H)    Bilirubin, Direct 0.1 - 0.5 mg/dL 0.1      Indirect Bilirubin 0.3 - 0.9 mg/dL 0.8            Assessment and Plan:  1. New onset AFIB with RVR  In SR Stop amiodarone due to gi complaints Rate control going forward, daughter will try to keep up with how much afib she is having Will prob need low dose BB, which will also benefit LVD if BP can tolerate.   2. Combined systolic/diastolic heart failure Lasix/potassium stopped by PCP due to possible intolerance, resume as can tolerate every other to every 3rd day for dyspnea, prevent fluid overload Continue lisinopril, move to bedtime Weight has been stable, no PND/orthopnea/pedal edema  3. Chadsvasc score of at least 4 Tolerating [place], no bleeding concerns Some discussion that she might not be able to afford in the long run and may want to transition over to warfarin at some point.  Assistance number given for eliquis.  Multiple questions answered by family today.  F/u with Dr. Sallyanne Kuster as scheduled in two weeks Phone f/u as needed to initiate rate control if afib burden increases before seeing Dr. Loletha Grayer. Afib clinic as needed.

## 2015-03-29 NOTE — Patient Instructions (Addendum)
Information regarding eliquis assistance # (469)098-6623 (1-855-eliquis) they are open Monday-Friday 8a-8p  Your physician has recommended you make the following change in your medication:  1)Discontinue Amiodarone 2)Lasix 20mg  --- 1 tablet daily as needed for shortness of breath, cough, or weight increase of more than 2-3pounds over night 3)Change lisinopril dose to bedtime   Monitor Afib symptoms over next week and call report to Butch Penny [phone]

## 2015-03-31 ENCOUNTER — Emergency Department (HOSPITAL_COMMUNITY): Payer: Medicare Other

## 2015-03-31 ENCOUNTER — Observation Stay (HOSPITAL_COMMUNITY)
Admission: EM | Admit: 2015-03-31 | Discharge: 2015-04-02 | Disposition: A | Discharge status: Home / Self Care | Payer: Medicare Other | Attending: Oncology | Admitting: Oncology

## 2015-03-31 ENCOUNTER — Encounter (HOSPITAL_COMMUNITY): Payer: Self-pay | Admitting: Family Medicine

## 2015-03-31 DIAGNOSIS — R112 Nausea with vomiting, unspecified: Secondary | ICD-10-CM | POA: Diagnosis present

## 2015-03-31 DIAGNOSIS — K219 Gastro-esophageal reflux disease without esophagitis: Secondary | ICD-10-CM | POA: Diagnosis not present

## 2015-03-31 DIAGNOSIS — I340 Nonrheumatic mitral (valve) insufficiency: Secondary | ICD-10-CM | POA: Diagnosis present

## 2015-03-31 DIAGNOSIS — I5043 Acute on chronic combined systolic (congestive) and diastolic (congestive) heart failure: Secondary | ICD-10-CM | POA: Diagnosis not present

## 2015-03-31 DIAGNOSIS — T502X5A Adverse effect of carbonic-anhydrase inhibitors, benzothiadiazides and other diuretics, initial encounter: Secondary | ICD-10-CM | POA: Diagnosis not present

## 2015-03-31 DIAGNOSIS — R0602 Shortness of breath: Secondary | ICD-10-CM

## 2015-03-31 DIAGNOSIS — Z888 Allergy status to other drugs, medicaments and biological substances status: Secondary | ICD-10-CM | POA: Diagnosis not present

## 2015-03-31 DIAGNOSIS — Z681 Body mass index (BMI) 19 or less, adult: Secondary | ICD-10-CM | POA: Diagnosis not present

## 2015-03-31 DIAGNOSIS — I341 Nonrheumatic mitral (valve) prolapse: Secondary | ICD-10-CM | POA: Insufficient documentation

## 2015-03-31 DIAGNOSIS — M419 Scoliosis, unspecified: Secondary | ICD-10-CM | POA: Diagnosis not present

## 2015-03-31 DIAGNOSIS — E876 Hypokalemia: Secondary | ICD-10-CM | POA: Diagnosis not present

## 2015-03-31 DIAGNOSIS — I480 Paroxysmal atrial fibrillation: Secondary | ICD-10-CM | POA: Diagnosis not present

## 2015-03-31 DIAGNOSIS — R0600 Dyspnea, unspecified: Secondary | ICD-10-CM | POA: Diagnosis not present

## 2015-03-31 DIAGNOSIS — E871 Hypo-osmolality and hyponatremia: Secondary | ICD-10-CM | POA: Insufficient documentation

## 2015-03-31 DIAGNOSIS — I509 Heart failure, unspecified: Secondary | ICD-10-CM | POA: Insufficient documentation

## 2015-03-31 HISTORY — DX: Paroxysmal atrial fibrillation: I48.0

## 2015-03-31 HISTORY — DX: Chronic combined systolic (congestive) and diastolic (congestive) heart failure: I50.42

## 2015-03-31 HISTORY — DX: Nonrheumatic mitral (valve) insufficiency: I34.0

## 2015-03-31 LAB — CBC
HEMATOCRIT: 42.2 % (ref 36.0–46.0)
HEMOGLOBIN: 14.2 g/dL (ref 12.0–15.0)
MCH: 32.2 pg (ref 26.0–34.0)
MCHC: 33.6 g/dL (ref 30.0–36.0)
MCV: 95.7 fL (ref 78.0–100.0)
Platelets: 239 10*3/uL (ref 150–400)
RBC: 4.41 MIL/uL (ref 3.87–5.11)
RDW: 14.1 % (ref 11.5–15.5)
WBC: 6.9 10*3/uL (ref 4.0–10.5)

## 2015-03-31 LAB — COMPREHENSIVE METABOLIC PANEL
ALT: 13 U/L — ABNORMAL LOW (ref 14–54)
AST: 25 U/L (ref 15–41)
Albumin: 3 g/dL — ABNORMAL LOW (ref 3.5–5.0)
Alkaline Phosphatase: 79 U/L (ref 38–126)
Anion gap: 11 (ref 5–15)
BUN: 9 mg/dL (ref 6–20)
CALCIUM: 8.7 mg/dL — AB (ref 8.9–10.3)
CHLORIDE: 96 mmol/L — AB (ref 101–111)
CO2: 25 mmol/L (ref 22–32)
Creatinine, Ser: 0.7 mg/dL (ref 0.44–1.00)
GFR calc Af Amer: >60 mL/min (ref >60)
GFR calc non Af Amer: >60 mL/min (ref >60)
Glucose, Bld: 112 mg/dL — ABNORMAL HIGH (ref 65–99)
Potassium: 3.5 mmol/L (ref 3.5–5.1)
SODIUM: 132 mmol/L — AB (ref 135–145)
Total Bilirubin: 1.6 mg/dL — ABNORMAL HIGH (ref 0.3–1.2)
Total Protein: 6.7 g/dL (ref 6.5–8.1)

## 2015-03-31 LAB — URINALYSIS, ROUTINE W REFLEX MICROSCOPIC
Bilirubin Urine: NEGATIVE
GLUCOSE, UA: NEGATIVE mg/dL
KETONES UR: 15 mg/dL — AB
Nitrite: NEGATIVE
Protein, ur: NEGATIVE mg/dL
Specific Gravity, Urine: 1.012 (ref 1.005–1.030)
Urobilinogen, UA: 1 mg/dL (ref 0.0–1.0)
pH: 5.5 (ref 5.0–8.0)

## 2015-03-31 LAB — I-STAT TROPONIN, ED: Troponin i, poc: 0 ng/mL (ref 0.00–0.08)

## 2015-03-31 LAB — BRAIN NATRIURETIC PEPTIDE: B Natriuretic Peptide: 160.3 pg/mL — ABNORMAL HIGH (ref 0.0–100.0)

## 2015-03-31 LAB — BASIC METABOLIC PANEL
Anion gap: 10 (ref 5–15)
BUN: 10 mg/dL (ref 6–20)
CO2: 25 mmol/L (ref 22–32)
CREATININE: 0.67 mg/dL (ref 0.44–1.00)
Calcium: 8.5 mg/dL — ABNORMAL LOW (ref 8.9–10.3)
Chloride: 95 mmol/L — ABNORMAL LOW (ref 101–111)
GFR calc Af Amer: >60 mL/min (ref >60)
Glucose, Bld: 113 mg/dL — ABNORMAL HIGH (ref 65–99)
Potassium: 3.5 mmol/L (ref 3.5–5.1)
SODIUM: 130 mmol/L — AB (ref 135–145)

## 2015-03-31 LAB — D-DIMER, QUANTITATIVE (NOT AT ARMC): D-Dimer, Quant: 3.43 ug/mL-FEU — ABNORMAL HIGH (ref 0.00–0.48)

## 2015-03-31 LAB — URINE MICROSCOPIC-ADD ON

## 2015-03-31 LAB — PROTIME-INR
INR: 1.54 — ABNORMAL HIGH (ref 0.00–1.49)
Prothrombin Time: 18.5 seconds — ABNORMAL HIGH (ref 11.6–15.2)

## 2015-03-31 MED ORDER — ALBUTEROL SULFATE (2.5 MG/3ML) 0.083% IN NEBU
5.0000 mg | INHALATION_SOLUTION | Freq: Once | RESPIRATORY_TRACT | Status: AC
  Administered 2015-03-31: 5 mg via RESPIRATORY_TRACT
  Filled 2015-03-31: qty 6

## 2015-03-31 MED ORDER — POTASSIUM CHLORIDE CRYS ER 20 MEQ PO TBCR
40.0000 meq | EXTENDED_RELEASE_TABLET | Freq: Once | ORAL | Status: AC
  Administered 2015-03-31: 40 meq via ORAL
  Filled 2015-03-31: qty 2

## 2015-03-31 MED ORDER — FUROSEMIDE 10 MG/ML IJ SOLN
40.0000 mg | Freq: Once | INTRAMUSCULAR | Status: AC
  Administered 2015-03-31: 40 mg via INTRAVENOUS
  Filled 2015-03-31: qty 4

## 2015-03-31 MED ORDER — ACETAMINOPHEN 500 MG PO TABS: 500.0000 mg | ORAL_TABLET | Freq: Four times a day (QID) | ORAL | Status: DC | PRN

## 2015-03-31 MED ORDER — PREDNISONE 50 MG PO TABS
60.0000 mg | ORAL_TABLET | Freq: Every day | ORAL | Status: DC
  Administered 2015-03-31: 60 mg via ORAL
  Filled 2015-03-31: qty 3
  Filled 2015-03-31: qty 1

## 2015-03-31 MED ORDER — ADULT MULTIVITAMIN W/MINERALS CH
1.0000 | ORAL_TABLET | Freq: Every day | ORAL | Status: DC
  Administered 2015-04-01 – 2015-04-02 (×2): 1 via ORAL
  Filled 2015-03-31 (×2): qty 1

## 2015-03-31 MED ORDER — FAMOTIDINE 20 MG PO TABS
20.0000 mg | ORAL_TABLET | Freq: Every day | ORAL | Status: DC | PRN
  Administered 2015-04-01: 20 mg via ORAL
  Filled 2015-03-31 (×2): qty 1

## 2015-03-31 MED ORDER — ALENDRONATE SODIUM 70 MG PO TABS: 70.0000 mg | ORAL_TABLET | ORAL | Status: DC

## 2015-03-31 MED ORDER — APIXABAN 2.5 MG PO TABS
2.5000 mg | ORAL_TABLET | Freq: Two times a day (BID) | ORAL | Status: DC
  Administered 2015-03-31 – 2015-04-02 (×4): 2.5 mg via ORAL
  Filled 2015-03-31 (×7): qty 1

## 2015-03-31 NOTE — ED Notes (Signed)
Pt assisted to bedside commode; family in room

## 2015-03-31 NOTE — ED Notes (Signed)
Placed pt back on monitor, continuous pulse oximetry and blood pressure cuff; family at bedside

## 2015-03-31 NOTE — H&P (Signed)
Date: 03/31/2015               Patient Name:  Christina Buckley MRN: [id number]  DOB: 12/05/1923 Age / Sex: 81 y.o., female   PCP: Leonides Sake, MD         Medical Service: Internal Medicine Teaching Service         Attending Physician: Dr. Annia Belt, MD    First Contact: Dr. Lottie Mussel Pager: [phone]  Second Contact: Dr. Michail Jewels Pager: [phone]       After Hours (After 5p/  First Contact Pager: [phone]  weekends / holidays): Second Contact Pager: [phone]   Chief Complaint: SOB  History of Present Illness: Christina Buckley is an 81 year old woman with paroxysmal atrial fibrillation, CHF, GERD who presents with dyspnea. She was recently admitted from 02/19/15-03/01/15 for a fib with RVR. She underwent synchronized cardioversion which successfully placed her back in NSR but then she reverted back in and out of atrial fibrillation/atrial tachycardia. Marland Kitchen She was discharged on amiodarone 300 mg bid and eliquis 2.5 mg bid (CHADSVasc 4). She was also diagnosed with heart failure w EF 28-76%, grade 2 diastolic dysfunction, severe mitral regurgitation, mitral valve prolapse, and moderate pulmonary HTN PAP 48 mmHg. She was started on lisinopril 2.5 mg daily, lasix 40 mg bid and KDur 20 mEq daily. She was to follow-up with Dr Sallyanne Kuster and PCP shortly after discharge.  Since discharge, she has had nausea and emesis thought to be related to amiodarone which was decreased and eventually stopped by cardiology two days ago. Other than that, she was feeling well until about 2-3 days ago when she began to have  increasing dyspnea, at rest and with exertion. She says she has orthopnea and had to sleep in her recliner last night. She has had a cough but it is non-productive. She last had palpitations about 3 days ago but none since. She denies chest pain, fevers, chills, night sweats, abdominal pain, diarrhea, constipation, dysuria.  In the ED, he received prednisone 60 mg po once, lasix 40 mg iv  once, albuterol neb x 1. She is feeling better compared to presentation at the time of the interview.  Meds: Current Facility-Administered Medications  Medication Dose Route Frequency Provider Last Rate Last Dose  . [START ON 04/01/2015] predniSONE (DELTASONE) tablet 60 mg  60 mg Oral Q breakfast Pattricia Boss, MD   60 mg at 03/31/15 1646    Allergies: Allergies as of 03/31/2015 - Review Complete 03/31/2015  Allergen Reaction Noted  . Amiodarone Nausea And Vomiting 03/31/2015   Past Medical History  Diagnosis Date  . GERD (gastroesophageal reflux disease)   . Scoliosis   . Heart murmur    History reviewed. No pertinent past surgical history. Family History  Problem Relation Age of Onset  . Heart attack Father     in his 24s  . Diabetes Mother   . Heart attack Mother     lived to 39  . Heart attack Paternal Grandfather    History   Social History  . Marital Status: Married    Spouse Name: N/A  . Number of Children: N/A  . Years of Education: N/A   Occupational History  . 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 Smoking status: Never Smoker   . Smokeless tobacco: Not on file  . Alcohol Use: Not on file  . Drug Use: Not on file  .  Sexual Activity: Not on file   Other Topics Concern  . Not on file   Social History Narrative   Lives at home alone, husband passed away last year.  Has multiple children nearby who look in on her daily.    Review of Systems: Review of systems negative except as noted above per HPI  Physical Exam: Blood pressure 97/68, pulse 89, temperature 97.5 F (36.4 C), temperature source Oral, resp. rate 18, height 5' (1.524 m), weight 100 lb 4.8 oz (45.496 kg), SpO2 96 %.  Gen: No acute distress, A&O x 3, frail appearing, scoliosis HEENT: Atraumatic, sunken eyes, PERRL, EOMI, sclerae anicteric, moist mucous membranes Neck: No carotid bruits or JVD Heart: Regular rate and rhythm, normal S1 S2, no  murmurs, rubs, or gallops Lungs: Bilateral crackles at bases and up to mid lung on L, respirations unlabored Abd: Soft, non-tender, non-distended, + bowel sounds, no hepatosplenomegaly Ext: Trace pitting edema in ankles  Lab results: Basic Metabolic Panel:  Recent Labs  03/31/15 1547  NA 132*  130*  K 3.5  3.5  CL 96*  95*  CO2 25  25  GLUCOSE 112*  113*  BUN 9  10  CREATININE 0.70  0.67  CALCIUM 8.7*  8.5*   Liver Function Tests:  Recent Labs  03/31/15 1547  AST 25  ALT 13*  ALKPHOS 79  BILITOT 1.6*  PROT 6.7  ALBUMIN 3.0*   CBC:  Recent Labs  03/31/15 1547  WBC 6.9  HGB 14.2  HCT 42.2  MCV 95.7  PLT 239   D-Dimer:  Recent Labs  03/31/15 1547  DDIMER 3.43*   Coagulation:  Recent Labs  03/31/15 1547  LABPROT 18.5*  INR 1.54*   Misc. Labs: i-stat troponin 0.00 BNP 160  Imaging results:  Dg Chest 2 View  03/31/2015   CLINICAL DATA:  Shortness of breath for 3 days, history arrhythmia, GERD, heart murmur  EXAM: CHEST  2 VIEW  COMPARISON:  02/20/2015  FINDINGS: Severe thoracic deformity secondary to levo thoracolumbar scoliosis.  Enlargement of cardiac silhouette with vascular congestion.  Diffuse infiltrates and effusions likely representing pulmonary edema/failure.  Bibasilar atelectasis.  No pneumothorax.  Bones demineralized.  IMPRESSION: CHF with bibasilar effusions and atelectasis.  Severe levoconvex thoracolumbar scoliosis.   Electronically Signed   By: Lavonia Dana M.D.   On: 03/31/2015 16:18   Other results: EKG: NSR, low voltage, t-wave inversion in lead V3, otherwise unchanged from prior 03/29/15  Assessment & Plan by Problem: Active Problems:   CHF (congestive heart failure)  #Acute on Chronic Combined CHF: Christina Buckley appears to have CHF exacerbation. Her history is suggestive with increased dyspnea on exertion and orthopnea. Exam with bilateral crackles but only faint LE edema. While her BNP is not elevated, her CXR does note  vascular congestion and diffuse infiltrates and effusions likely representing pulmonary edema/failure. At home she is on lisinopril 2.5 mg qhs and lasix 20 mg dailyprn. Her daughter notes that they have changed this to every three days but it is unclear who ordered that frequency. There is concern in chart that that daughter who is RN changes medication regimens and so family members want home hospice but she does not qualify. Cause of exacerbation could be her paroxysmal atrial fibrillation or she is not taking appropriate lasix regimen. ED provider says they have consulted cardiology. At home she is on lasix 20 mg dailyprn, lisinopril 2.5 mg qhs -f/u cardiology -reassess volume status in morning to consider further diuresis -d/c  steroids which cause fluid retention -cont lisinopril 2.5 mg qhs -telemetry -daily weights, I&O  #Paroxysmal Atrial Fibrillation: Christina Buckley was in normal sinus rhythm on EKG. She had recent admission for a fib with RVR where she had synchronized cardioversion. She was recently started on eliquis 2.5 mg bid given a CHADSVasc of 4. Her amiodarone was decreased over the past month by daughter and stopped two days ago by cardiology NP due to concern that she had nausea as side effect. -cont eliquis 2.5 mg bid -telemetry  #Nausea and emesis: Christina Buckley has had nausea and emesis reportedly since she started amiodarone a month ago. Abdominal exam benign.This could be a side effect and given the long half life of the drug could be in her system still as it was just stopped two days ago. LFTs were normal despite self-reported history of hepatitis (type unknown) as a child. -cont to monitor  #Hyponatremia: Presenting sodium 130 likely 2/2 hyponatremia -cont to monitor as treat CHF above  #GERD: Stable. At home she is on omprazole 20 mg dailyprn. -pepcid 20 mg dailyprn  #Diet: 2 g sodium diet  #DVT PPx: eliquis as above  #Code: DNR  Dispo: Disposition is deferred at this  time, awaiting improvement of current medical problems. Anticipated discharge in approximately 1-2 day(s).   The patient does have a current PCP (Leonides Sake, MD) and does need an [facility] hospital follow-up appointment after discharge.  The patient does not know have transportation limitations that hinder transportation to clinic appointments.  Signed: Lottie Mussel, MD Internal Medicine, PGY-1 Pager [phone] 03/31/2015, 7:00 PM

## 2015-03-31 NOTE — Consult Note (Signed)
CARDIOLOGY CONSULT NOTE   Patient ID: Christina Buckley MRN: [id number] DOB/AGE: [birth year] 81 y.o.  Admit Date: 03/31/2015  Primary Physician: Leonides Sake, MD  Primary Cardiologist    [place] Reason for Consultation:   Clinical Summary Christina Buckley is a 81 y.o.female. She presents today with shortness of breath and chest x-ray evidence of CHF. His story the she does not get significant edema or significant abdominal swelling before she shows pulmonary signs of volume overload. She also has a cough that goes along with her volume overload. She is also recovering from having several days of nausea and vomiting. She has not had any vomiting today.  The patient had been admitted in early May, 2016. She had atrial fibrillation. Amiodarone was started and she went home on Mar 01, 2015. It is my understanding that she converted as an outpatient. She was then seen back in the atrial fibrillation clinic on at least 2 occasions. On the first visit she appeared to be stable. By the second visit she was having significant problems with nausea and vomiting and the decision was made to stop her amiodarone. The plan at that point was to work toward rate control. Although at this point she's holding sinus rhythm. Amiodarone was stopped approximately 2 days ago. She still had nausea and vomiting yesterday but today has not had any vomiting. Her daughters are with her and they are very informed. They are puzzled because she has developed CHF without her weight going above her baseline. I suspect that she has lost true body weight and in fact her new dry weight will be lower. She's not having any significant chest pain. She is anticoagulated.   Allergies  Allergen Reactions  . Amiodarone Nausea And Vomiting    Medications Scheduled Medications: . [START ON 04/01/2015] predniSONE  60 mg Oral Q breakfast     Infusions:     PRN Medications:     Past Medical History  Diagnosis Date  . GERD  (gastroesophageal reflux disease)   . Scoliosis   . Heart murmur     History reviewed. No pertinent past surgical history.  Family History  Problem Relation Age of Onset  . Heart attack Father     in his 74s  . Diabetes Mother   . Heart attack Mother     lived to 37  . Heart attack Paternal Grandfather     Social History [name] reports that she has never smoked. She does not have any smokeless tobacco history on file. [name] has no alcohol history on file.   Review of systems:    Patient denies fever, chills, headache, sweats, rash, change in vision, change in hearing, chest pain, urinary symptoms.    Physical examination The patient is spry and feeling better already with diuresis. She is oriented to person time and place. Affect is normal. Her 2 daughters are with her. Head is atraumatic. Sclera and conjunctiva are normal. She has severe kyphoscoliosis. Her lung sounds are distant. Cardiac exam reveals S1 and S2. Her abdomen is soft. She has no significant peripheral edema. There are no significant skin rashes. Neurologic is grossly intact.  Physical Examination Blood pressure 98/65, pulse 94, temperature 97.8 F (36.6 C), temperature source Oral, resp. rate 20, weight 100 lb (45.36 kg), SpO2 95 %.  Intake/Output Summary (Last 24 hours) at 03/31/15 1857 Last data filed at 03/31/15 1734  Gross per 24 hour  Intake      0 ml  Output  150 ml  Net   -150 ml    Prior Cardiac Testing/Procedures  Lab Results  Basic Metabolic Panel:  Recent Labs Lab 03/31/15 1547  NA 132*  130*  K 3.5  3.5  CL 96*  95*  CO2 25  25  GLUCOSE 112*  113*  BUN 9  10  CREATININE 0.70  0.67  CALCIUM 8.7*  8.5*    Liver Function Tests:  Recent Labs Lab 03/31/15 1547  AST 25  ALT 13*  ALKPHOS 79  BILITOT 1.6*  PROT 6.7  ALBUMIN 3.0*    CBC:  Recent Labs Lab 03/31/15 1547  WBC 6.9  HGB 14.2  HCT 42.2  MCV 95.7  PLT 239    Cardiac Enzymes: No  results for input(s): CKTOTAL, CKMB, CKMBINDEX, TROPONINI in the last 168 hours.  BNP: Invalid input(s): POCBNP   Radiology: Dg Chest 2 View  03/31/2015   CLINICAL DATA:  Shortness of breath for 3 days, history arrhythmia, GERD, heart murmur  EXAM: CHEST  2 VIEW  COMPARISON:  02/20/2015  FINDINGS: Severe thoracic deformity secondary to levo thoracolumbar scoliosis.  Enlargement of cardiac silhouette with vascular congestion.  Diffuse infiltrates and effusions likely representing pulmonary edema/failure.  Bibasilar atelectasis.  No pneumothorax.  Bones demineralized.  IMPRESSION: CHF with bibasilar effusions and atelectasis.  Severe levoconvex thoracolumbar scoliosis.   Electronically Signed   By: Lavonia Dana M.D.   On: 03/31/2015 16:18     ECG:  EKG today shows normal sinus rhythm with nonspecific ST-T changes. This is unchanged from the past.  Telemetry:  I have reviewed telemetry today in the emergency room. She has sinus rhythm.   Impression and Recommendations    Acute on chronic combined systolic and diastolic congestive heart failure  (ejection fraction 40-45% on recent echo.)     The patient has volume overload with CHF at this time. By history she does not get significant edema or abdominal swelling before she has pulmonary findings of CHF. Her daughters relate that her weight has been stable. However I believe that she has lost true body weight. I suspect that her dry weight will be lower than previously thought. She is already feeling better with some diuresis. For now we should plan to continue diuresis through the night and reassess her status tomorrow. Fortunately she is holding sinus rhythm. If she reverts to atrial fibrillation, we will have to adjust the meds further for rate control. She received Lasix in the emergency room. I have ordered an additional dose of 40 mg to be given IV at 10:00 tonight. New orders for Lasix will have to be given tomorrow based on her overall  status.    Paroxysmal atrial fibrillation     As of today she is holding sinus rhythm. She was on amiodarone and it was stopped 2 days ago. She did not tolerate the amiodarone as this presumably was the basis of her nausea and vomiting. She is anticoagulated and this should be continued. As soon as her volume is under better control, it will probably be appropriate to start a small dose of carvedilol. This will be helpful for rate control if she reverts to atrial fibrillation. He will also be good for her mildly decreased left ventricular function.      Mitral regurgitation    There is a history of mitral regurgitation. It was moderate/severe on the last echo. No further workup at this time     Nausea and vomiting  Amiodarone was stopped 2 days ago. Today is the first day that she is not vomiting.  Presumably the amiodarone was the cause.     Hypokalemia     Potassium is 3.5. She will need careful dosing of her potassium over the next several days.    Daryel November, MD 03/31/2015, 6:57 PM

## 2015-03-31 NOTE — ED Notes (Signed)
Pt will call out when she has to go to the bathroom; aware of need of urine specimen; family in room; myself and Randall Hiss, RN readjusted pt on the stretcher

## 2015-03-31 NOTE — ED Provider Notes (Signed)
CSN: [id number]     Arrival date & time 03/31/15  1517 History   First MD Initiated Contact with Patient 03/31/15 1536     Chief Complaint  Patient presents with  . Shortness of Breath     (Consider location/radiation/quality/duration/timing/severity/associated sxs/prior Treatment) HPI 81 year old female history of paroxysmal atrial fibrillation and congestive heart failure presents today with increasing dyspnea over the past 2-3 days. She has orthopnea and increased dyspnea with exertion. She has not noted any increased peripheral edema. She has had a cough but it has been nonproductive. She has not noted fever, URI symptoms, myalgias.  She was started on oxygen two days ago but has continued to be more short of breath.  She  Past Medical History  Diagnosis Date  . GERD (gastroesophageal reflux disease)   . Scoliosis   . Heart murmur    History reviewed. No pertinent past surgical history. Family History  Problem Relation Age of Onset  . Heart attack Father     in his 73s  . Diabetes Mother   . Heart attack Mother     lived to 72  . Heart attack Paternal Grandfather    History  Substance Use Topics  . Smoking status: Never Smoker   . Smokeless tobacco: Not on file  . Alcohol Use: Not on file   OB History    No data available     Review of Systems  All other systems reviewed and are negative.     Allergies  Review of patient's allergies indicates no known allergies.  Home Medications   Prior to Admission medications   Medication Sig Start Date End Date Taking? Authorizing Provider  acetaminophen (TYLENOL) 325 MG tablet Take 650 mg by mouth daily as needed for mild pain.    Historical Provider, MD  alendronate (FOSAMAX) 70 MG tablet Take 70 mg by mouth once a week. Take on Saturdays 01/25/15   Historical Provider, MD  apixaban (ELIQUIS) 2.5 MG TABS tablet Take 1 tablet (2.5 mg total) by mouth every 12 (twelve) hours. 03/15/15   Sherran Needs, NP  Calcium  Carbonate-Vitamin D (CALCIUM + D PO) Take 1 tablet by mouth 2 (two) times daily.    Historical Provider, MD  furosemide (LASIX) 20 MG tablet Take 1 tablet daily as needed when she has shortness of breath, cough, weight gain 03/29/15   Sherran Needs, NP  lisinopril (PRINIVIL,ZESTRIL) 2.5 MG tablet Take 1 tablet (2.5 mg total) by mouth at bedtime. 03/29/15   Sherran Needs, NP  Multiple Vitamin (MULTIVITAMIN WITH MINERALS) TABS Take 1 tablet by mouth daily.    Historical Provider, MD  omeprazole (PRILOSEC) 20 MG capsule Take 20 mg by mouth daily.    Historical Provider, MD  Plant Sterols and Stanols (CHOLESTOFF PO) Take 1 capsule by mouth 2 (two) times daily.    Historical Provider, MD  Red Yeast Rice 600 MG CAPS Take 1,200 mg by mouth 2 (two) times daily.    Historical Provider, MD   BP 124/72 mmHg  Pulse 84  Temp(Src) 97.8 F (36.6 C) (Oral)  Resp 32  Wt 100 lb (45.36 kg)  SpO2 95% Physical Exam  Constitutional: She is oriented to person, place, and time. She appears well-developed and well-nourished.  HENT:  Head: Normocephalic and atraumatic.  Eyes: Conjunctivae are normal. Pupils are equal, round, and reactive to light.  Neck: Normal range of motion. Neck supple.  Cardiovascular: Normal rate, regular rhythm, normal heart sounds and intact distal pulses.  Pulmonary/Chest: Effort normal.  Bibasilar crackles with decreased breath sounds through lower half of lung fields.  Abdominal: Soft. Bowel sounds are normal.  Musculoskeletal: She exhibits no edema or tenderness.  Kyphotic  Neurological: She is alert and oriented to person, place, and time. She has normal reflexes.  Skin: Skin is warm and dry.  Psychiatric: She has a normal mood and affect. Her behavior is normal. Judgment and thought content normal.  Nursing note and vitals reviewed.   ED Course  Procedures (including critical care time) Labs Review Labs Reviewed  BASIC METABOLIC PANEL - Abnormal; Notable for the following:     Sodium 130 (*)    Chloride 95 (*)    Glucose, Bld 113 (*)    Calcium 8.5 (*)    All other components within normal limits  BRAIN NATRIURETIC PEPTIDE - Abnormal; Notable for the following:    B Natriuretic Peptide 160.3 (*)    All other components within normal limits  PROTIME-INR - Abnormal; Notable for the following:    Prothrombin Time 18.5 (*)    INR 1.54 (*)    All other components within normal limits  COMPREHENSIVE METABOLIC PANEL - Abnormal; Notable for the following:    Sodium 132 (*)    Chloride 96 (*)    Glucose, Bld 112 (*)    Calcium 8.7 (*)    Albumin 3.0 (*)    ALT 13 (*)    Total Bilirubin 1.6 (*)    All other components within normal limits  D-DIMER, QUANTITATIVE (NOT AT [facility]) - Abnormal; Notable for the following:    D-Dimer, Quant 3.43 (*)    All other components within normal limits  URINALYSIS, ROUTINE W REFLEX MICROSCOPIC (NOT AT [facility]) - Abnormal; Notable for the following:    APPearance CLOUDY (*)    Hgb urine dipstick TRACE (*)    Ketones, ur 15 (*)    Leukocytes, UA LARGE (*)    All other components within normal limits  URINE MICROSCOPIC-ADD ON - Abnormal; Notable for the following:    Bacteria, UA MANY (*)    All other components within normal limits  CBC  I-STAT TROPOININ, ED    Imaging Review Dg Chest 2 View  03/31/2015   CLINICAL DATA:  Shortness of breath for 3 days, history arrhythmia, GERD, heart murmur  EXAM: CHEST  2 VIEW  COMPARISON:  02/20/2015  FINDINGS: Severe thoracic deformity secondary to levo thoracolumbar scoliosis.  Enlargement of cardiac silhouette with vascular congestion.  Diffuse infiltrates and effusions likely representing pulmonary edema/failure.  Bibasilar atelectasis.  No pneumothorax.  Bones demineralized.  IMPRESSION: CHF with bibasilar effusions and atelectasis.  Severe levoconvex thoracolumbar scoliosis.   Electronically Signed   By: Lavonia Dana M.D.   On: 03/31/2015 16:18     EKG Interpretation   Date/Time:   Friday March 31 2015 15:57:47 EDT Ventricular Rate:  82 PR Interval:  206 QRS Duration: 95 QT Interval:  506 QTC Calculation: 591 R Axis:   22 Text Interpretation:  Normal sinus rhythm Poor R wave progression  Non-specific ST-t changes Confirmed by Callaway Hardigree MD, Andee Poles [phone]) on  03/31/2015 4:32:21 PM      MDM   Final diagnoses:  Acute on chronic combined systolic and diastolic congestive heart failure   This is an 81 year old female with increasing shortness of breath and desaturations despite the fact that she is on oxygen. She has a chest x-Taneisha Fuson consistent with congestive heart failure. Her d-dimer is elevated, however she appears to be  in significant heart failure on her chest x-Loys Shugars and is already anticoagulated. Patient discussed with Dr. Gordy Levan on call for internal medicine teaching service as patient is unassigned. I've also placed a page to her cardiologist and awaiting call back.   Discussed with Dr. Gordy Levan on call for internal medicine.  Plan observation and diuresis.  Cardiology paged.   Pattricia Boss, MD 03/31/15 [phone]

## 2015-03-31 NOTE — ED Notes (Signed)
Pt here for SOB x 2 days. Sent here by doctor with possible pleural effusions and cough. Denies pain.

## 2015-04-01 ENCOUNTER — Observation Stay (HOSPITAL_COMMUNITY): Payer: Medicare Other

## 2015-04-01 DIAGNOSIS — R112 Nausea with vomiting, unspecified: Secondary | ICD-10-CM

## 2015-04-01 DIAGNOSIS — I340 Nonrheumatic mitral (valve) insufficiency: Secondary | ICD-10-CM | POA: Diagnosis not present

## 2015-04-01 DIAGNOSIS — E871 Hypo-osmolality and hyponatremia: Secondary | ICD-10-CM

## 2015-04-01 DIAGNOSIS — R8299 Other abnormal findings in urine: Secondary | ICD-10-CM

## 2015-04-01 DIAGNOSIS — I341 Nonrheumatic mitral (valve) prolapse: Secondary | ICD-10-CM | POA: Diagnosis not present

## 2015-04-01 DIAGNOSIS — I5043 Acute on chronic combined systolic (congestive) and diastolic (congestive) heart failure: Secondary | ICD-10-CM | POA: Diagnosis not present

## 2015-04-01 DIAGNOSIS — K219 Gastro-esophageal reflux disease without esophagitis: Secondary | ICD-10-CM

## 2015-04-01 DIAGNOSIS — I480 Paroxysmal atrial fibrillation: Secondary | ICD-10-CM | POA: Diagnosis not present

## 2015-04-01 DIAGNOSIS — J90 Pleural effusion, not elsewhere classified: Secondary | ICD-10-CM | POA: Diagnosis not present

## 2015-04-01 LAB — BASIC METABOLIC PANEL
ANION GAP: 11 (ref 5–15)
BUN: 12 mg/dL (ref 6–20)
CHLORIDE: 94 mmol/L — AB (ref 101–111)
CO2: 28 mmol/L (ref 22–32)
Calcium: 8.3 mg/dL — ABNORMAL LOW (ref 8.9–10.3)
Creatinine, Ser: 0.95 mg/dL (ref 0.44–1.00)
GFR calc Af Amer: >60 mL/min (ref >60)
GFR, EST NON AFRICAN AMERICAN: 52 mL/min — AB (ref >60)
GLUCOSE: 187 mg/dL — AB (ref 65–99)
Potassium: 3.3 mmol/L — ABNORMAL LOW (ref 3.5–5.1)
Sodium: 133 mmol/L — ABNORMAL LOW (ref 135–145)

## 2015-04-01 LAB — PROCALCITONIN: Procalcitonin: <0.1 ng/mL

## 2015-04-01 LAB — MAGNESIUM: MAGNESIUM: 1.7 mg/dL (ref 1.7–2.4)

## 2015-04-01 MED ORDER — POTASSIUM CHLORIDE CRYS ER 20 MEQ PO TBCR
40.0000 meq | EXTENDED_RELEASE_TABLET | Freq: Once | ORAL | Status: AC
  Administered 2015-04-01: 40 meq via ORAL
  Filled 2015-04-01: qty 2

## 2015-04-01 MED ORDER — POTASSIUM CHLORIDE CRYS ER 20 MEQ PO TBCR: 40.0000 meq | EXTENDED_RELEASE_TABLET | Freq: Once | ORAL | Status: DC

## 2015-04-01 MED ORDER — FUROSEMIDE 40 MG PO TABS
40.0000 mg | ORAL_TABLET | Freq: Once | ORAL | Status: AC
  Administered 2015-04-01: 40 mg via ORAL
  Filled 2015-04-01: qty 1

## 2015-04-01 MED ORDER — FUROSEMIDE 10 MG/ML IJ SOLN
20.0000 mg | Freq: Once | INTRAMUSCULAR | Status: AC
  Administered 2015-04-02: 20 mg via INTRAVENOUS
  Filled 2015-04-01: qty 2

## 2015-04-01 MED ORDER — CARVEDILOL 3.125 MG PO TABS
3.1250 mg | ORAL_TABLET | Freq: Two times a day (BID) | ORAL | Status: DC
  Administered 2015-04-01 – 2015-04-02 (×2): 3.125 mg via ORAL
  Filled 2015-04-01 (×4): qty 1

## 2015-04-01 MED ORDER — FUROSEMIDE 40 MG PO TABS
40.0000 mg | ORAL_TABLET | Freq: Every day | ORAL | Status: DC
  Administered 2015-04-02: 40 mg via ORAL
  Filled 2015-04-01 (×2): qty 1

## 2015-04-01 NOTE — Progress Notes (Signed)
Subjective:  Breathing better; no chest pain  Objective:   Vital Signs : Filed Vitals:   03/31/15 1813 03/31/15 1857 03/31/15 2002 04/01/15 0551  BP: 98/65 97/68 98/48  102/64  Pulse: 94 89 83 83  Temp:  97.5 F (36.4 C) 98 F (36.7 C) 98 F (36.7 C)  TempSrc:  Oral Oral Oral  Resp: 20 18 18 18   Height:  5' (1.524 m)    Weight:  45.496 kg (100 lb 4.8 oz)  44.8 kg (98 lb 12.3 oz)  SpO2: 95% 96% 97% 93%    Intake/Output from previous day:  Intake/Output Summary (Last 24 hours) at 04/01/15 1029 Last data filed at 04/01/15 0846  Gross per 24 hour  Intake    360 ml  Output   1301 ml  Net   -941 ml    I/O since admission: -941  Wt Readings from Last 3 Encounters:  04/01/15 44.8 kg (98 lb 12.3 oz)  03/29/15 46.539 kg (102 lb 9.6 oz)  03/15/15 47.9 kg (105 lb 9.6 oz)    Medications: . apixaban  2.5 mg Oral Q12H  . multivitamin with minerals  1 tablet Oral Daily  . potassium chloride SA  40 mEq Oral Once       Physical Exam:   General appearance: alert, cooperative and no distress Neck: no adenopathy, no JVD, supple, symmetrical, trachea midline and thyroid not enlarged, symmetric, no tenderness/mass/nodules Lungs: decreased BS; no rales Back: scoliosis Heart: regular rate and rhythm and 1-1/9 systolic murmur; no s3 no rub Abdomen: soft, non-tender; bowel sounds normal; no masses,  no organomegaly Extremities: no edema, redness or tenderness in the calves or thighs Neurologic: Grossly normal   Rate: 90  Rhythm: normal sinus rhythm  ECG (independently read by me): NSR no ectopy  Lab Results:   Recent Labs  03/31/15 1547 04/01/15 0211  NA 132*  130* 133*  K 3.5  3.5 3.3*  CL 96*  95* 94*  CO2 25  25 28   GLUCOSE 112*  113* 187*  BUN 9  10 12   CREATININE 0.70  0.67 0.95  CALCIUM 8.7*  8.5* 8.3*  MG  --  1.7     Recent Labs  03/31/15 1547  WBC 6.9  HGB 14.2  HCT 42.2  MCV 95.7  PLT 239    No results for input(s): TROPONINI in  the last 72 hours.  Invalid input(s): CK, MB  Lab Results  Component Value Date   TSH 2.463 02/19/2015     Recent Labs  03/31/15 1547  PROT 6.7  ALBUMIN 3.0*  AST 25  ALT 13*  ALKPHOS 79  BILITOT 1.6*    Recent Labs  03/31/15 1547  INR 1.54*   BNP (last 3 results)  Recent Labs  02/23/15 0341 03/31/15 1547  BNP 299.9* 160.3*    ProBNP (last 3 results) No results for input(s): PROBNP in the last 8760 hours.   Lipid Panel  No results found for: CHOL, TRIG, HDL, CHOLHDL, VLDL, LDLCALC, LDLDIRECT     No results for input(s): HGBA1C in the last 72 hours.   Imaging:  Dg Chest 2 View  03/31/2015   CLINICAL DATA:  Shortness of breath for 3 days, history arrhythmia, GERD, heart murmur  EXAM: CHEST  2 VIEW  COMPARISON:  02/20/2015  FINDINGS: Severe thoracic deformity secondary to levo thoracolumbar scoliosis.  Enlargement of cardiac silhouette with vascular congestion.  Diffuse infiltrates and effusions likely representing pulmonary edema/failure.  Bibasilar atelectasis.  No pneumothorax.  Bones demineralized.  IMPRESSION: CHF with bibasilar effusions and atelectasis.  Severe levoconvex thoracolumbar scoliosis.   Electronically Signed   By: Lavonia Dana M.D.   On: 03/31/2015 16:18    02/21/2015 ECHO Study Conclusions  - Left ventricle: The cavity size was normal. There was focal basal hypertrophy. Systolic function was mildly to moderately reduced. The estimated ejection fraction was in the range of 40% to 45%. Wall motion was normal; there were no regional wall motion abnormalities. Features are consistent with a pseudonormal left ventricular filling pattern, with concomitant abnormal relaxation and increased filling pressure (grade 2 diastolic dysfunction). - Aortic valve: There was mild regurgitation. - Mitral valve: Prolapse. There was moderate to severe regurgitation. - Left atrium: The atrium was mildly dilated. - Tricuspid valve: There was  moderate regurgitation. - Pulmonary arteries: Systolic pressure was moderately increased. PA peak pressure: 48 mm Hg (S).   Assessment/Plan:   Principal Problem:   Acute on chronic combined systolic and diastolic congestive heart failure Active Problems:   Paroxysmal atrial fibrillation   Mitral regurgitation   Nausea and vomiting   Diuretic-induced hypokalemia   1. Acute on chronic combined CHF:  BNP yesterday 160; Feels much better with diuresis -941 2. PAF:  Maintaining sinus rhythm;  Now off amiodarone;  Will add carvedilol 3.125 mg bid today. 3. Hypo K: replete and check Mg 4. Hypo Na:  Continue diuresis 5. Mod-severe MR with MVP 6. Nausea/vomiting resolved.  Will keep in hospital today. Monitor rhythm, replete K, add coreg. ? DC tomorrow if stable.   Troy Sine, MD, [facility] 04/01/2015, 10:29 AM

## 2015-04-01 NOTE — Plan of Care (Signed)
Problem: Phase I Progression Outcomes Goal: EF % per last Echo/documented,Core Reminder form on chart Outcome: Completed/Met Date Met:  04/01/15 Performed on 02/21/2015 EF% result - 40-45%

## 2015-04-01 NOTE — Progress Notes (Signed)
Notified doctor for patient has some concern of new order of IV lasix that is ordered at this time when she states she had just taken a PO dose of lasix this afternoon. Doctor states will come to see patient and will address after assessing patient.

## 2015-04-01 NOTE — Progress Notes (Signed)
Subjective: Christina Buckley. Ms Christina Buckley says she feels much improved this morning. She says that her SOB is largely resolved as is her cough. No complaint of nausea. She is asking about going home. I explained how we will try to change the way we manage her a fib and answered family's questions, pending cards recs.  Objective: Vital signs in last 24 hours: Filed Vitals:   03/31/15 1857 03/31/15 2002 04/01/15 0551 04/01/15 0900  BP: 97/68 98/48 102/64 106/54  Pulse: 89 83 83 85  Temp: 97.5 F (36.4 C) 98 F (36.7 C) 98 F (36.7 C) 98.7 F (37.1 C)  TempSrc: Oral Oral Oral Oral  Resp: 18 18 18 20   Height: 5' (1.524 m)     Weight: 100 lb 4.8 oz (45.496 kg)  98 lb 12.3 oz (44.8 kg)   SpO2: 96% 97% 93% 98%   Weight change:   Intake/Output Summary (Last 24 hours) at 04/01/15 1201 Last data filed at 04/01/15 1000  Gross per 24 hour  Intake    360 ml  Output   1301 ml  Net   -941 ml   Gen: No acute distress, A&O x 3, frail appearing, scoliosis HEENT: Atraumatic, sunken eyes, PERRL, EOMI, sclerae anicteric, moist mucous membranes Neck: No carotid bruits or JVD Heart: Regular rate and rhythm, normal S1 S2, no murmurs, rubs, or gallops Lungs: CTAB b/l, respirations unlabored Abd: Soft, non-tender, non-distended, + bowel sounds, no hepatosplenomegaly Ext: No edema b/l  Lab Results: Basic Metabolic Panel:  Recent Labs Lab 03/31/15 1547 04/01/15 0211  NA 132*  130* 133*  K 3.5  3.5 3.3*  CL 96*  95* 94*  CO2 25  25 28   GLUCOSE 112*  113* 187*  BUN 9  10 12   CREATININE 0.70  0.67 0.95  CALCIUM 8.7*  8.5* 8.3*  MG  --  1.7   Liver Function Tests:  Recent Labs Lab 03/31/15 1547  AST 25  ALT 13*  ALKPHOS 79  BILITOT 1.6*  PROT 6.7  ALBUMIN 3.0*   CBC:  Recent Labs Lab 03/31/15 1547  WBC 6.9  HGB 14.2  HCT 42.2  MCV 95.7  PLT 239   D-Dimer:  Recent Labs Lab 03/31/15 1547  DDIMER 3.43*   Coagulation:  Recent Labs Lab 03/31/15 1547  LABPROT 18.5*    INR 1.54*   Urinalysis:  Recent Labs Lab 03/31/15 1530  COLORURINE YELLOW  LABSPEC 1.012  PHURINE 5.5  GLUCOSEU NEGATIVE  HGBUR TRACE*  BILIRUBINUR NEGATIVE  KETONESUR 15*  PROTEINUR NEGATIVE  UROBILINOGEN 1.0  NITRITE NEGATIVE  LEUKOCYTESUR LARGE*  rare squam, 21-50 WBC, 0-2 RBC, many bacteria  Misc. Labs: i-stat troponin 0.00 BNP 160  Micro Results: No results found for this or any previous visit (from the past 240 hour(s)). Studies/Results: Dg Chest 2 View  03/31/2015   CLINICAL DATA:  Shortness of breath for 3 days, history arrhythmia, GERD, heart murmur  EXAM: CHEST  2 VIEW  COMPARISON:  02/20/2015  FINDINGS: Severe thoracic deformity secondary to levo thoracolumbar scoliosis.  Enlargement of cardiac silhouette with vascular congestion.  Diffuse infiltrates and effusions likely representing pulmonary edema/failure.  Bibasilar atelectasis.  No pneumothorax.  Bones demineralized.  IMPRESSION: CHF with bibasilar effusions and atelectasis.  Severe levoconvex thoracolumbar scoliosis.   Electronically Signed   By: Lavonia Dana M.D.   On: 03/31/2015 16:18   Medications: I have reviewed the patient's current medications. Scheduled Meds: . apixaban  2.5 mg Oral Q12H  . carvedilol  3.125  mg Oral BID WC  . furosemide  40 mg Oral Once  . multivitamin with minerals  1 tablet Oral Daily  . potassium chloride SA  40 mEq Oral Once   Continuous Infusions:  PRN Meds:.acetaminophen, famotidine Assessment/Plan: Principal Problem:   Acute on chronic combined systolic and diastolic congestive heart failure Active Problems:   Paroxysmal atrial fibrillation   Mitral regurgitation   Nausea and vomiting   Diuretic-induced hypokalemia  #Acute on Chronic Combined CHF: This appears to be CHF exacerbation given increased dyspnea on exertion and orthopnea, bilateral crackles, CXR with vascular congestion and diffuse infiltrates and effusions as well as her symptomatic improvement following iv  diuresis. Trigger could be weaning off amiodarone and episode of palpitations ~3 days before presenting was a fib. Seen by cards yesterday who gave additional lasix 40 mg iv last night, now net -0.96L and ~1.5 lbs. Cards recs starting low dose coreg as her volume status improved. At home she is on lasix 20 mg dailyprn, lisinopril 2.5 mg qhs -appreciate cards -lasix 40 mg po once today, reassess tomorrow -start coreg 3.125 mg bid -cont lisinopril 2.5 mg qhs -start coreg 3.125 mg bid once volume status improves -telemetry -daily weights, I&O  #Paroxysmal Atrial Fibrillation: Ms Christina Buckley is now in NSR. She had recent admission for a fib with RVR where she had synchronized cardioversion but went back in and out of a fib so was started on amiodarone that daughter weaned due to nausea and cards NP stopped two days before admission. She was recently started on eliquis 2.5 mg bid given a CHADSVasc of 4. Cardiology recommends rate control and starting coreg as above. -cont eliquis 2.5 mg bid -start coreg 3.125 mg bid  -telemetry  #Nausea and emesis: Ms Christina Buckley reports nausea and emesis since starting amiodarone a month ago. Abdominal exam benign.Thought to be side effect of amiodarone but given long half life, would still be in her system. LFTs were normal despite self-reported history of hepatitis (type unknown) as a child. -cont to monitor  #Hyponatremia: Presenting sodium 130 likely 2/2 CHF, now 58 -cont to monitor as treat CHF above  #GERD: Stable. At home she is on omprazole 20 mg dailyprn. -pepcid 20 mg dailyprn  #Asymptomatic bacteruria: UA with many bacteria, large leukocyte but she is afebrile, no leukocytosis, and denies any dysuria. -cont to monitor for symptoms  #Diet: 2 g sodium diet  #DVT PPx: eliquis as above  #Code: DNR  Dispo: Disposition is deferred at this time, awaiting improvement of current medical problems.  Anticipated discharge in approximately 1 day.   The patient  does have a current PCP (Leonides Sake, MD) and does need an [facility] hospital follow-up appointment after discharge.  The patient does not know have transportation limitations that hinder transportation to clinic appointments.  .Services Needed at time of discharge: Y = Yes, Blank = No PT:   OT:   RN:   Equipment:   Other:       Kelby Aline, MD 04/01/2015, 12:01 PM

## 2015-04-02 ENCOUNTER — Encounter (HOSPITAL_COMMUNITY): Payer: Self-pay | Admitting: Nurse Practitioner

## 2015-04-02 DIAGNOSIS — I340 Nonrheumatic mitral (valve) insufficiency: Secondary | ICD-10-CM | POA: Diagnosis not present

## 2015-04-02 DIAGNOSIS — K219 Gastro-esophageal reflux disease without esophagitis: Secondary | ICD-10-CM | POA: Diagnosis not present

## 2015-04-02 DIAGNOSIS — I5043 Acute on chronic combined systolic (congestive) and diastolic (congestive) heart failure: Secondary | ICD-10-CM | POA: Diagnosis not present

## 2015-04-02 DIAGNOSIS — M419 Scoliosis, unspecified: Secondary | ICD-10-CM | POA: Diagnosis not present

## 2015-04-02 DIAGNOSIS — I480 Paroxysmal atrial fibrillation: Secondary | ICD-10-CM | POA: Diagnosis not present

## 2015-04-02 LAB — BASIC METABOLIC PANEL
Anion gap: 12 (ref 5–15)
BUN: 17 mg/dL (ref 6–20)
CALCIUM: 8.6 mg/dL — AB (ref 8.9–10.3)
CHLORIDE: 91 mmol/L — AB (ref 101–111)
CO2: 31 mmol/L (ref 22–32)
Creatinine, Ser: 0.94 mg/dL (ref 0.44–1.00)
GFR calc non Af Amer: 53 mL/min — ABNORMAL LOW (ref >60)
GLUCOSE: 109 mg/dL — AB (ref 65–99)
Potassium: 3.9 mmol/L (ref 3.5–5.1)
Sodium: 134 mmol/L — ABNORMAL LOW (ref 135–145)

## 2015-04-02 LAB — MAGNESIUM: Magnesium: 1.7 mg/dL (ref 1.7–2.4)

## 2015-04-02 MED ORDER — CARVEDILOL 3.125 MG PO TABS
3.1250 mg | ORAL_TABLET | Freq: Two times a day (BID) | ORAL | Status: DC
Start: 2015-04-02 — End: 2015-04-05

## 2015-04-02 MED ORDER — FAMOTIDINE 20 MG PO TABS: 20.0000 mg | ORAL_TABLET | Freq: Every day | ORAL | Status: DC | PRN

## 2015-04-02 MED ORDER — FUROSEMIDE 20 MG PO TABS: 20.0000 mg | ORAL_TABLET | Freq: Every day | ORAL | Status: DC

## 2015-04-02 NOTE — Progress Notes (Signed)
Subjective:  Breathing better; no chest pain  Objective:   Vital Signs : Filed Vitals:   04/01/15 2056 04/02/15 0115 04/02/15 0558 04/02/15 0649  BP: 100/53 118/70 100/56   Pulse: 75 84 77   Temp: 97.8 F (36.6 C) 97.9 F (36.6 C) 98 F (36.7 C)   TempSrc: Oral Oral Oral   Resp: 18 18 18    Height:      Weight:    43.954 kg (96 lb 14.4 oz)  SpO2: 94% 95% 96%     Intake/Output from previous day:  Intake/Output Summary (Last 24 hours) at 04/02/15 0953 Last data filed at 04/02/15 0546  Gross per 24 hour  Intake    840 ml  Output   1525 ml  Net   -685 ml    I/O since admission: -1626  Wt Readings from Last 3 Encounters:  04/02/15 43.954 kg (96 lb 14.4 oz)  03/29/15 46.539 kg (102 lb 9.6 oz)  03/15/15 47.9 kg (105 lb 9.6 oz)    Medications: . apixaban  2.5 mg Oral Q12H  . carvedilol  3.125 mg Oral BID WC  . furosemide  40 mg Oral Daily  . multivitamin with minerals  1 tablet Oral Daily       Physical Exam:   General appearance: alert, cooperative and no distress Neck: no adenopathy, no JVD, supple, symmetrical, trachea midline and thyroid not enlarged, symmetric, no tenderness/mass/nodules Lungs: decreased BS; no rales Back: scoliosis Heart: regular rate and rhythm and 7-3/4 systolic murmur; no s3 no rub Abdomen: soft, non-tender; bowel sounds normal; no masses,  no organomegaly Extremities: no edema, redness or tenderness in the calves or thighs Neurologic: Grossly normal   Rate: 75  Rhythm: normal sinus rhythm   04/02/15 ECG (independently read by me): NST at 75, PAC, QTc 471   Prior ECG (independently read by me): NSR no ectopy  Lab Results:   Recent Labs  03/31/15 1547 04/01/15 0211 04/02/15 0305  NA 132*  130* 133* 134*  K 3.5  3.5 3.3* 3.9  CL 96*  95* 94* 91*  CO2 25  25 28 31   GLUCOSE 112*  113* 187* 109*  BUN 9  10 12 17   CREATININE 0.70  0.67 0.95 0.94  CALCIUM 8.7*  8.5* 8.3* 8.6*  MG  --  1.7 1.7     Recent Labs  03/31/15 1547  WBC 6.9  HGB 14.2  HCT 42.2  MCV 95.7  PLT 239    No results for input(s): TROPONINI in the last 72 hours.  Invalid input(s): CK, MB  Lab Results  Component Value Date   TSH 2.463 02/19/2015     Recent Labs  03/31/15 1547  PROT 6.7  ALBUMIN 3.0*  AST 25  ALT 13*  ALKPHOS 79  BILITOT 1.6*    Recent Labs  03/31/15 1547  INR 1.54*   BNP (last 3 results)  Recent Labs  02/23/15 0341 03/31/15 1547  BNP 299.9* 160.3*    ProBNP (last 3 results) No results for input(s): PROBNP in the last 8760 hours.   Lipid Panel  No results found for: CHOL, TRIG, HDL, CHOLHDL, VLDL, LDLCALC, LDLDIRECT     No results for input(s): HGBA1C in the last 72 hours.   Imaging:  Dg Chest 2 View  03/31/2015   CLINICAL DATA:  Shortness of breath for 3 days, history arrhythmia, GERD, heart murmur  EXAM: CHEST  2 VIEW  COMPARISON:  02/20/2015  FINDINGS: Severe thoracic deformity secondary to  levo thoracolumbar scoliosis.  Enlargement of cardiac silhouette with vascular congestion.  Diffuse infiltrates and effusions likely representing pulmonary edema/failure.  Bibasilar atelectasis.  No pneumothorax.  Bones demineralized.  IMPRESSION: CHF with bibasilar effusions and atelectasis.  Severe levoconvex thoracolumbar scoliosis.   Electronically Signed   By: Lavonia Dana M.D.   On: 03/31/2015 16:18   Dg Chest Port 1 View  04/01/2015   CLINICAL DATA:  Dyspnea  EXAM: PORTABLE CHEST - 1 VIEW  COMPARISON:  03/31/2015  FINDINGS: Moderate vascular and interstitial congestive changes are present. There are bilateral effusions. There is bibasilar airspace consolidation.  IMPRESSION: The findings are most consistent with congestive heart failure. Bilateral pleural effusions.   Electronically Signed   By: Andreas Newport M.D.   On: 04/01/2015 22:23    02/21/2015 ECHO Study Conclusions  - Left ventricle: The cavity size was normal. There was focal basal hypertrophy. Systolic function  was mildly to moderately reduced. The estimated ejection fraction was in the range of 40% to 45%. Wall motion was normal; there were no regional wall motion abnormalities. Features are consistent with a pseudonormal left ventricular filling pattern, with concomitant abnormal relaxation and increased filling pressure (grade 2 diastolic dysfunction). - Aortic valve: There was mild regurgitation. - Mitral valve: Prolapse. There was moderate to severe regurgitation. - Left atrium: The atrium was mildly dilated. - Tricuspid valve: There was moderate regurgitation. - Pulmonary arteries: Systolic pressure was moderately increased. PA peak pressure: 48 mm Hg (S).   Assessment/Plan:   Principal Problem:   Acute on chronic combined systolic and diastolic congestive heart failure Active Problems:   Paroxysmal atrial fibrillation   Mitral regurgitation   Nausea and vomiting   Diuretic-induced hypokalemia   1. Acute on chronic combined CHF:  BNP on 6/10 at 160; Feels much better with diuresis -1626 and 6 lb weight loss 2. PAF:  Maintaining sinus rhythm;  Now off amiodarone;  carvedilol 3.125 mg bid added yesterday 3. Hypo K: better 4. Hypo Na:  Continue diuresis; improved to 1`34 5. Mod-severe MR with MVP 6. Nausea/vomiting resolved.  Will dc today; keep appointment with Dr. Sallyanne Kuster in several weeks but see extender later this week. Decrease lasix to 20 mg daily and add back lisinopril 2.5 mg hs. F/u Bmet this week as outpatient.   Troy Sine, MD, [facility] 04/02/2015, 9:53 AM

## 2015-04-02 NOTE — Progress Notes (Signed)
[name] discharged home per MD order. Discharge instructions reviewed and discussed with patient. All questions and concerns answered. Copy of instructions and scripts given to patient. IV removed.  Patient escorted to car by staff in a wheelchair. No distress noted upon discharge.   Nicki Reaper Toombs 04/02/2015 1:08 PM

## 2015-04-02 NOTE — Discharge Instructions (Signed)
Please keep your follow-up appointments; this is very important for your continued recovery.    We have made the following additions/changes to your medications:  Please refer to your medication list.    Please continue to take all of your medications as prescribed.  Do not miss any doses without contacting your primary physician.  If you have questions, please contact your physician or contact the Internal Medicine Teaching Service at [phone].  Please bring your medicications with you to your appointments; medications may be eye drops, herbals, vitamins, or pills.    If you believe you are suffering from a life-threatening emergency, go to the nearest Emergency Department.    --------------------------------------------  Information on my medicine - ELIQUIS (apixaban)  This medication education was reviewed with me or my healthcare representative as part of my discharge preparation.  The pharmacist that spoke with me during my hospital stay was:  Arty Baumgartner, [facility]  Why was Eliquis prescribed for you? Eliquis was prescribed for you to reduce the risk of a blood clot forming that can cause a stroke if you have a medical condition called atrial fibrillation (a type of irregular heartbeat).  What do You need to know about Eliquis ? Take your Eliquis TWICE DAILY - one tablet in the morning and one tablet in the evening with or without food. If you have difficulty swallowing the tablet whole please discuss with your pharmacist how to take the medication safely.  Take Eliquis exactly as prescribed by your doctor and DO NOT stop taking Eliquis without talking to the doctor who prescribed the medication.  Stopping may increase your risk of developing a stroke.  Refill your prescription before you run out.  After discharge, you should have regular check-up appointments with your healthcare provider that is prescribing your Eliquis.  In the future your dose may need to be changed  if your kidney function or weight changes by a significant amount or as you get older.  What do you do if you miss a dose? If you miss a dose, take it as soon as you remember on the same day and resume taking twice daily.  Do not take more than one dose of ELIQUIS at the same time to make up a missed dose.  Important Safety Information A possible side effect of Eliquis is bleeding. You should call your healthcare provider right away if you experience any of the following: ? Bleeding from an injury or your nose that does not stop. ? Unusual colored urine (red or dark brown) or unusual colored stools (red or black). ? Unusual bruising for unknown reasons. ? A serious fall or if you hit your head (even if there is no bleeding).  Some medicines may interact with Eliquis and might increase your risk of bleeding or clotting while on Eliquis. To help avoid this, consult your healthcare provider or pharmacist prior to using any new prescription or non-prescription medications, including herbals, vitamins, non-steroidal anti-inflammatory drugs (NSAIDs) and supplements.  This website has more information on Eliquis (apixaban): http://www.eliquis.com/eliquis/home

## 2015-04-02 NOTE — Progress Notes (Signed)
Patient ID: Christina Buckley, female   DOB: [birth year], 81 y.o.   MRN: [id number] Medicine attending discharge note: Discharge plan reviewed with the patient and her family at the patient's bedside this morning. I attest to the accuracy of the discharge evaluation and plan as recorded by resident physician Dr. Loyal Jacobson.  Clinical summary: Pleasant 81 year old woman recently admitted here May 1 through May 11 with new onset of paroxysmal atrial fibrillation. She was cardioverted and went back into sinus rhythm. She was started on amiodarone initial dose 300 mg twice daily and apixiban anticoagulation. Echocardiogram done during that admission showed systolic and diastolic heart failure with estimated ejection fraction 40-45 percent with grade 2 diastolic dysfunction, severe mitral regurgitation and mitral valve prolapse. Please see Dr. Bebe Shaggy history and physical for additional details.  Since discharge she has developed intermittent nausea and vomiting felt related to the amiodarone. Medication was stopped 2 prior to admission. 3 prior to admission she had an episode of palpitations and since that time has had progressive dyspnea and orthopnea but no further palpitations. Additional medications at time of recent hospital discharge included Lasix 20 mg as needed for dyspnea and lisinopril 2.5 mg daily.   Hospital course: Cardiogram showed normal sinus rhythm with no acute ischemic changes and no arrhythmias. Chest x-ray showed cardiomegaly and bilateral pleural effusions left greater than right. Mild increase in vascular congestion compared to May 1 study. She received broncho-dilators and furosemide in the emergency department with prompt relief of her symptoms. She was seen in consultation by cardiology. Amiodarone was permanently discontinued. Nausea and vomiting completely resolved. She was started on a low dose beta blocker, carvedilol 3.125 mg twice daily in order to maintain sinus  rhythm. Apixiban and anticoagulation was continued. Electrolyte abnormalities were corrected.  Disposition: She will be discharged in stable condition to follow-up with her cardiologist. She will be put on a maintenance dose of furosemide 20 mg daily in addition to low-dose Coreg and low-dose lisinopril. There were no complications.

## 2015-04-02 NOTE — Progress Notes (Signed)
Subjective:  VSS.  Pt breathing better today.  Down 1.6L yesterday and down 4 lbs since admission.    Objective: Vital signs in last 24 hours: Filed Vitals:   04/01/15 2056 04/02/15 0115 04/02/15 0558 04/02/15 0649  BP: 100/53 118/70 100/56   Pulse: 75 84 77   Temp: 97.8 F (36.6 C) 97.9 F (36.6 C) 98 F (36.7 C)   TempSrc: Oral Oral Oral   Resp: 18 18 18    Height:      Weight:    43.954 kg (96 lb 14.4 oz)  SpO2: 94% 95% 96%    Weight change: -1.406 kg (-3 lb 1.6 oz)  Intake/Output Summary (Last 24 hours) at 04/02/15 1104 Last data filed at 04/02/15 0546  Gross per 24 hour  Intake    840 ml  Output   1525 ml  Net   -685 ml   Gen: No acute distress, A&O x 3, frail appearing, kyphosis  HEENT: Atraumatic, sunken eyes, EOMI, sclerae anicteric, moist mucous membranes Neck: No carotid bruits or JVD Heart: RRR, normal S1 S2, no murmurs, rubs, or gallops Lungs: CTAB b/l, respirations unlabored Abd: Soft, non-tender, non-distended, + bowel sounds, no hepatosplenomegaly Ext: No edema b/l  Lab Results: Basic Metabolic Panel:  Recent Labs Lab 04/01/15 0211 04/02/15 0305  NA 133* 134*  K 3.3* 3.9  CL 94* 91*  CO2 28 31  GLUCOSE 187* 109*  BUN 12 17  CREATININE 0.95 0.94  CALCIUM 8.3* 8.6*  MG 1.7 1.7   Liver Function Tests:  Recent Labs Lab 03/31/15 1547  AST 25  ALT 13*  ALKPHOS 79  BILITOT 1.6*  PROT 6.7  ALBUMIN 3.0*   CBC:  Recent Labs Lab 03/31/15 1547  WBC 6.9  HGB 14.2  HCT 42.2  MCV 95.7  PLT 239   D-Dimer:  Recent Labs Lab 03/31/15 1547  DDIMER 3.43*   Coagulation:  Recent Labs Lab 03/31/15 1547  LABPROT 18.5*  INR 1.54*   Urinalysis:  Recent Labs Lab 03/31/15 1530  COLORURINE YELLOW  LABSPEC 1.012  PHURINE 5.5  GLUCOSEU NEGATIVE  HGBUR TRACE*  BILIRUBINUR NEGATIVE  KETONESUR 15*  PROTEINUR NEGATIVE  UROBILINOGEN 1.0  NITRITE NEGATIVE  LEUKOCYTESUR LARGE*  rare squam, 21-50 WBC, 0-2 RBC, many  bacteria  Misc. Labs: i-stat troponin 0.00 BNP 160  Micro Results: No results found for this or any previous visit (from the past 240 hour(s)). Studies/Results: Dg Chest 2 View  03/31/2015   CLINICAL DATA:  Shortness of breath for 3 days, history arrhythmia, GERD, heart murmur  EXAM: CHEST  2 VIEW  COMPARISON:  02/20/2015  FINDINGS: Severe thoracic deformity secondary to levo thoracolumbar scoliosis.  Enlargement of cardiac silhouette with vascular congestion.  Diffuse infiltrates and effusions likely representing pulmonary edema/failure.  Bibasilar atelectasis.  No pneumothorax.  Bones demineralized.  IMPRESSION: CHF with bibasilar effusions and atelectasis.  Severe levoconvex thoracolumbar scoliosis.   Electronically Signed   By: Lavonia Dana M.D.   On: 03/31/2015 16:18   Dg Chest Port 1 View  04/01/2015   CLINICAL DATA:  Dyspnea  EXAM: PORTABLE CHEST - 1 VIEW  COMPARISON:  03/31/2015  FINDINGS: Moderate vascular and interstitial congestive changes are present. There are bilateral effusions. There is bibasilar airspace consolidation.  IMPRESSION: The findings are most consistent with congestive heart failure. Bilateral pleural effusions.   Electronically Signed   By: Andreas Newport M.D.   On: 04/01/2015 22:23   Medications: I have reviewed the patient's current medications. Scheduled  Meds: . apixaban  2.5 mg Oral Q12H  . carvedilol  3.125 mg Oral BID WC  . furosemide  40 mg Oral Daily  . multivitamin with minerals  1 tablet Oral Daily   Continuous Infusions:  PRN Meds:.acetaminophen, famotidine Assessment/Plan: Principal Problem:   Acute on chronic combined systolic and diastolic congestive heart failure Active Problems:   Paroxysmal atrial fibrillation   Mitral regurgitation   Nausea and vomiting   Diuretic-induced hypokalemia  #Acute on Chronic Combined CHF: CHF exacerbation given increased dyspnea on exertion and orthopnea, bilateral crackles, CXR with vascular congestion and  diffuse infiltrates and effusions as well as her symptomatic improvement following iv diuresis. Trigger could be weaning off amiodarone and episode of palpitations ~3 days before presenting was a fib.  Apparently was also not taking lasix daily and was taking as needed.  Cards OK to d/c home today with close f/u.   -appreciate cards -resume 20mg  home lasix today daily  -cont coreg 3.125 mg bid -cont lisinopril 2.5 mg qhs -daily weights, I&O  #Paroxysmal Atrial Fibrillation: Ms Kittle is now in NSR. She had recent admission for a fib with RVR where she had synchronized cardioversion but went back in and out of a fib so was started on amiodarone that daughter weaned due to nausea and cards NP stopped two days before admission. She was recently started on eliquis 2.5 mg bid given a CHADSVasc of 4. Cardiology recommends rate control and coreg as above. -cont eliquis 2.5 mg bid -coreg 3.125 mg bid   #Nausea and emesis: Ms Lowe reports nausea and emesis since starting amiodarone a month ago. Abdominal exam benign.Thought to be side effect of amiodarone but given long half life, would still be in her system. LFTs were normal despite self-reported history of hepatitis (type unknown) as a child. -cont to monitor  #Hyponatremia: Improved. Presenting sodium 130 likely 2/2 CHF. -cont to monitor as treat CHF above  #GERD: Stable. At home she is on omprazole 20 mg dailyprn. -pepcid 20 mg dailyprn  #Asymptomatic bacteruria: UA with many bacteria, large leukocyte but she is afebrile, no leukocytosis, and denies any dysuria. -cont to monitor for symptoms  #Diet: 2 g sodium diet  #DVT PPx: eliquis as above  #Code: DNR  Dispo: Stable for d/c today.   Jones Bales, MD 04/02/2015, 11:04 AM

## 2015-04-03 ENCOUNTER — Telehealth: Payer: Self-pay | Admitting: Cardiovascular Disease

## 2015-04-03 NOTE — Discharge Summary (Signed)
Name: Christina Buckley MRN: [id number] DOB: Oct 28, 1923 81 y.o. PCP: Leonides Sake, MD  Date of Admission: 03/31/2015  3:20 PM Date of Discharge: 04/02/2015 Attending Physician: Murriel Hopper, MD Discharge Diagnosis:  Principal Problem:   Acute on chronic combined systolic and diastolic congestive heart failure Active Problems:   Paroxysmal atrial fibrillation   Mitral regurgitation   Nausea and vomiting   Diuretic-induced hypokalemia  Discharge Medications:   Medication List    TAKE these medications        acetaminophen 500 MG tablet  Commonly known as:  TYLENOL  Take 500 mg by mouth every 6 (six) hours as needed for headache.     alendronate 70 MG tablet  Commonly known as:  FOSAMAX  Take 70 mg by mouth once a week. Take on Saturdays     apixaban 2.5 MG Tabs tablet  Commonly known as:  ELIQUIS  Take 1 tablet (2.5 mg total) by mouth every 12 (twelve) hours.     CALCIUM + D PO  Take 1 tablet by mouth 2 (two) times daily.     carvedilol 3.125 MG tablet  Commonly known as:  COREG  Take 1 tablet (3.125 mg total) by mouth 2 (two) times daily with a meal.     famotidine 20 MG tablet  Commonly known as:  PEPCID  Take 1 tablet (20 mg total) by mouth daily as needed for heartburn or indigestion.     furosemide 20 MG tablet  Commonly known as:  LASIX  Take 1 tablet (20 mg total) by mouth daily.     lisinopril 2.5 MG tablet  Commonly known as:  PRINIVIL,ZESTRIL  Take 1 tablet (2.5 mg total) by mouth at bedtime.     multivitamin with minerals Tabs tablet  Take 1 tablet by mouth daily.     omeprazole 20 MG capsule  Commonly known as:  PRILOSEC  Take 20 mg by mouth daily as needed (heartburn).        Disposition and follow-up:   Christina Buckley was discharged from [facility] in [place] condition.  At the hospital follow up visit please address:  1.  Volume status, medication compliance, any more nausea  2.  Labs / imaging needed at  time of follow-up: BMP  3.  Pending labs/ test needing follow-up: none  Follow-up Appointments: Follow-up Information    Schedule an appointment as soon as possible for a visit with Leonides Sake, MD.   Specialty:  Family Medicine   Why:  Hospital follow-up appointment in 1 week    Contact information:   Dr. Daiva Eves [place] [address] [phone]       Follow up with Sanda Klein, MD In 3 days.   Specialty:  Cardiology   Why:  We will arrange for follow-up with Dr.Croitoru's Nurse Practitioner and contact you to confirm.   Contact information:   [address] [place] [address] [phone]       Follow up with Murray Hodgkins, NP On 04/05/2015.   Specialties:  Nurse Practitioner, Cardiology, Radiology   Why:  @ 10:30 am   Contact information:   [address] [address] [phone]       Discharge Instructions: Discharge Instructions    (HEART FAILURE PATIENTS) Call MD:  Anytime you have any of the following symptoms: 1) 3 pound weight gain in 24 hours or 5 pounds in 1 week 2) shortness of breath, with  or without a dry hacking cough 3) swelling in the hands, feet or stomach 4) if you have to sleep on extra pillows at night in order to breathe.    Complete by:  As directed      Call MD for:  extreme fatigue    Complete by:  As directed      Diet - low sodium heart healthy    Complete by:  As directed      Discharge instructions    Complete by:  As directed   Please follow up with Dr. Sallyanne Kuster in the next couple of weeks.  You will need to take lasix 20mg  daily.     Increase activity slowly    Complete by:  As directed            Consultations: Treatment Team:  Rounding Lbcardiology, MD  Procedures Performed:  Dg Chest 2 View  03/31/2015   CLINICAL DATA:  Shortness of breath for 3 days, history arrhythmia, GERD, heart murmur  EXAM: CHEST  2 VIEW  COMPARISON:  02/20/2015   FINDINGS: Severe thoracic deformity secondary to levo thoracolumbar scoliosis.  Enlargement of cardiac silhouette with vascular congestion.  Diffuse infiltrates and effusions likely representing pulmonary edema/failure.  Bibasilar atelectasis.  No pneumothorax.  Bones demineralized.  IMPRESSION: CHF with bibasilar effusions and atelectasis.  Severe levoconvex thoracolumbar scoliosis.   Electronically Signed   By: Lavonia Dana M.D.   On: 03/31/2015 16:18   Dg Chest Port 1 View  04/01/2015   CLINICAL DATA:  Dyspnea  EXAM: PORTABLE CHEST - 1 VIEW  COMPARISON:  03/31/2015  FINDINGS: Moderate vascular and interstitial congestive changes are present. There are bilateral effusions. There is bibasilar airspace consolidation.  IMPRESSION: The findings are most consistent with congestive heart failure. Bilateral pleural effusions.   Electronically Signed   By: Andreas Newport M.D.   On: 04/01/2015 22:23   Admission HPI: Christina Buckley is an 81 year old woman with paroxysmal atrial fibrillation, CHF, GERD who presents with dyspnea. She was recently admitted from 02/19/15-03/01/15 for a fib with RVR. She underwent synchronized cardioversion which successfully placed her back in NSR but then she reverted back in and out of atrial fibrillation/atrial tachycardia. She was discharged on amiodarone 300 mg bid and eliquis 2.5 mg bid (CHADSVasc 4). She was also diagnosed with heart failure w EF 97-74%, grade 2 diastolic dysfunction, severe mitral regurgitation, mitral valve prolapse, and moderate pulmonary HTN PAP 48 mmHg. She was started on lisinopril 2.5 mg daily, lasix 40 mg bid and KDur 20 mEq daily. She was to follow-up with Dr Sallyanne Kuster and PCP shortly after discharge.  Since discharge, she has had nausea and emesis thought to be related to amiodarone which was decreased and eventually stopped by cardiology two days ago. Other than that, she was feeling well until about 2-3 days ago when she began to have increasing dyspnea, at  rest and with exertion. She says she has orthopnea and had to sleep in her recliner last night. She has had a cough but it is non-productive. She last had palpitations about 3 days ago but none since. She denies chest pain, fevers, chills, night sweats, abdominal pain, diarrhea, constipation, dysuria.  In the ED, he received prednisone 60 mg po once, lasix 40 mg iv once, albuterol neb x 1. She is feeling better compared to presentation at the time of the interview  Hospital Course by problem list: Principal Problem:   Acute on chronic combined systolic and diastolic congestive heart  failure Active Problems:   Paroxysmal atrial fibrillation   Mitral regurgitation   Nausea and vomiting   Diuretic-induced hypokalemia   #Acute on Chronic Combined CHF: CHF exacerbation given increased dyspnea on exertion and orthopnea, bilateral crackles, CXR with vascular congestion and diffuse infiltrates and effusions as well as her symptomatic improvement following iv diuresis. Trigger could be weaning off amiodarone and episode of palpitations ~3 days before presenting was a fib. Apparently was also not taking lasix daily and was taking as needed.Recent echo about one month ago with EF 40-45% and grade 2 diastolic dysfunction, severe mitral regurgitation and mitral valve prolapse. She was seen by cardiology who started coreg 3.125 mg bid once volume status improved and continued lisinopril 2.5 mg qhs. She was also put on scheduled lasix 20 mg daily.    #Paroxysmal Atrial Fibrillation: Christina Buckley was in normal sinus rhythm during her admission. She had recent admission for a fib with RVR where she had synchronized cardioversion but went back in and out of a fib so was started on amiodarone that daughter weaned due to nausea and cards NP stopped two days before admission. She was recently started on eliquis 2.5 mg bid given a CHADSVasc of 4. Cardiology recommends rate control and coreg as above. She was also kept off  the amiodarone due to her nausea issues which had resolved prior to discharge. Eliquis 2.5 mg bid was continued.   #Nausea and emesis: Christina Buckley reports nausea and emesis since starting amiodarone a month ago. Abdominal exam benign. LFTs were normal despite self-reported history of hepatitis (type unknown) as a child. This improved over the course of her admission and amiodarone was held on discharge.  #Hyponatremia: Presenting Na 130 which improved to 134 before discharge. Likely deu to CHF which was treated as above.  #Asymptomatic bacteruria: UA with many bacteria, large leukocyte but she is afebrile, no leukocytosis, and denies any dysuria.  Discharge Vitals:   BP 100/56 mmHg  Pulse 77  Temp(Src) 98 F (36.7 C) (Oral)  Resp 18  Ht 5' (1.524 m)  Wt 96 lb 14.4 oz (43.954 kg)  BMI 18.92 kg/m2  SpO2 96%  Discharge Physical Exam: Gen: No acute distress, A&O x 3, frail appearing, kyphosis  HEENT: Atraumatic, sunken eyes, EOMI, sclerae anicteric, moist mucous membranes Neck: No carotid bruits or JVD Heart: RRR, normal S1 S2, 3/6 systolic ejection murmur Lungs: CTAB b/l, respirations unlabored Abd: Soft, non-tender, non-distended, + bowel sounds, no hepatosplenomegaly Ext: No edema b/l  Discharge Labs:  Basic Metabolic Panel:  Recent Labs Lab 04/01/15 0211 04/02/15 0305  NA 133* 134*  K 3.3* 3.9  CL 94* 91*  CO2 28 31  GLUCOSE 187* 109*  BUN 12 17  CREATININE 0.95 0.94  CALCIUM 8.3* 8.6*  MG 1.7 1.7   Liver Function Tests:  Recent Labs Lab 03/31/15 1547  AST 25  ALT 13*  ALKPHOS 79  BILITOT 1.6*  PROT 6.7  ALBUMIN 3.0*   CBC:  Recent Labs Lab 03/31/15 1547  WBC 6.9  HGB 14.2  HCT 42.2  MCV 95.7  PLT 239   D-Dimer:  Recent Labs Lab 03/31/15 1547  DDIMER 3.43*   Coagulation:  Recent Labs Lab 03/31/15 1547  LABPROT 18.5*  INR 1.54*   Urinalysis:  Recent Labs Lab 03/31/15 1530  COLORURINE YELLOW  LABSPEC 1.012  PHURINE 5.5  GLUCOSEU  NEGATIVE  HGBUR TRACE*  BILIRUBINUR NEGATIVE  KETONESUR 15*  PROTEINUR NEGATIVE  UROBILINOGEN 1.0  NITRITE NEGATIVE  LEUKOCYTESUR  LARGE*    Misc. Labs: i-stat troponin 0.00 BNP 160 Procalcitonin <0.10    Signed: Kelby Aline, MD 04/03/2015, 2:10 PM    Services Ordered on Discharge: none Equipment Ordered on Discharge: none

## 2015-04-03 NOTE — Telephone Encounter (Signed)
7 dayTOC fu appt-appt per Ignacia Bayley 04-05-15 at 1030am

## 2015-04-03 NOTE — Telephone Encounter (Signed)
This is a [facility] of [facility] C.  Will route this message to [facility] triage pool for TCM call and follow-up.

## 2015-04-03 NOTE — Telephone Encounter (Signed)
Patient contacted regarding discharge from [facility] on 04-02-15.  Patient understands to follow up with provider chris berge np on 04-05-15 at 10:30 am at [address]. Patient understands discharge instructions? yes Patient understands medications and regiment? yes Patient understands to bring all medications to this visit? yes

## 2015-04-04 ENCOUNTER — Encounter (HOSPITAL_COMMUNITY): Payer: Self-pay | Admitting: *Deleted

## 2015-04-04 ENCOUNTER — Emergency Department (HOSPITAL_COMMUNITY): Payer: Medicare Other

## 2015-04-04 ENCOUNTER — Emergency Department (HOSPITAL_COMMUNITY)
Admission: EM | Admit: 2015-04-04 | Discharge: 2015-04-04 | Disposition: A | Discharge status: Home / Self Care | Payer: Medicare Other | Attending: Emergency Medicine | Admitting: Emergency Medicine

## 2015-04-04 DIAGNOSIS — R9431 Abnormal electrocardiogram [ECG] [EKG]: Secondary | ICD-10-CM | POA: Diagnosis not present

## 2015-04-04 DIAGNOSIS — K219 Gastro-esophageal reflux disease without esophagitis: Secondary | ICD-10-CM | POA: Diagnosis not present

## 2015-04-04 DIAGNOSIS — Z79899 Other long term (current) drug therapy: Secondary | ICD-10-CM | POA: Insufficient documentation

## 2015-04-04 DIAGNOSIS — S060X0A Concussion without loss of consciousness, initial encounter: Secondary | ICD-10-CM | POA: Insufficient documentation

## 2015-04-04 DIAGNOSIS — Y9289 Other specified places as the place of occurrence of the external cause: Secondary | ICD-10-CM | POA: Insufficient documentation

## 2015-04-04 DIAGNOSIS — S0990XA Unspecified injury of head, initial encounter: Secondary | ICD-10-CM | POA: Diagnosis present

## 2015-04-04 DIAGNOSIS — I5042 Chronic combined systolic (congestive) and diastolic (congestive) heart failure: Secondary | ICD-10-CM | POA: Diagnosis not present

## 2015-04-04 DIAGNOSIS — W01198A Fall on same level from slipping, tripping and stumbling with subsequent striking against other object, initial encounter: Secondary | ICD-10-CM | POA: Insufficient documentation

## 2015-04-04 DIAGNOSIS — R011 Cardiac murmur, unspecified: Secondary | ICD-10-CM | POA: Insufficient documentation

## 2015-04-04 DIAGNOSIS — M419 Scoliosis, unspecified: Secondary | ICD-10-CM | POA: Diagnosis not present

## 2015-04-04 DIAGNOSIS — Y998 Other external cause status: Secondary | ICD-10-CM | POA: Insufficient documentation

## 2015-04-04 DIAGNOSIS — W19XXXA Unspecified fall, initial encounter: Secondary | ICD-10-CM

## 2015-04-04 DIAGNOSIS — Y9389 Activity, other specified: Secondary | ICD-10-CM | POA: Insufficient documentation

## 2015-04-04 NOTE — Discharge Instructions (Signed)
Head Injury °You have received a head injury. It does not appear serious at this time. Headaches and vomiting are common following head injury. It should be easy to awaken from sleeping. Sometimes it is necessary for you to stay in the emergency department for a while for observation. Sometimes admission to the hospital may be needed. After injuries such as yours, most problems occur within the first 24 hours, but side effects may occur up to 7-10 days after the injury. It is important for you to carefully monitor your condition and contact your health care provider or seek immediate medical care if there is a change in your condition. °WHAT ARE THE TYPES OF HEAD INJURIES? °Head injuries can be as minor as a bump. Some head injuries can be more severe. More severe head injuries include: °· A jarring injury to the brain (concussion). °· A bruise of the brain (contusion). This mean there is bleeding in the brain that can cause swelling. °· A cracked skull (skull fracture). °· Bleeding in the brain that collects, clots, and forms a bump (hematoma). °WHAT CAUSES A HEAD INJURY? °A serious head injury is most likely to happen to someone who is in a car wreck and is not wearing a seat belt. Other causes of major head injuries include bicycle or motorcycle accidents, sports injuries, and falls. °HOW ARE HEAD INJURIES DIAGNOSED? °A complete history of the event leading to the injury and your current symptoms will be helpful in diagnosing head injuries. Many times, pictures of the brain, such as CT or MRI are needed to see the extent of the injury. Often, an overnight hospital stay is necessary for observation.  °WHEN SHOULD I SEEK IMMEDIATE MEDICAL CARE?  °You should get help right away if: °· You have confusion or drowsiness. °· You feel sick to your stomach (nauseous) or have continued, forceful vomiting. °· You have dizziness or unsteadiness that is getting worse. °· You have severe, continued headaches not relieved by  medicine. Only take over-the-counter or prescription medicines for pain, fever, or discomfort as directed by your health care provider. °· You do not have normal function of the arms or legs or are unable to walk. °· You notice changes in the black spots in the center of the colored part of your eye (pupil). °· You have a clear or bloody fluid coming from your nose or ears. °· You have a loss of vision. °During the next 24 hours after the injury, you must stay with someone who can watch you for the warning signs. This person should contact local emergency services (911 in the [place] if you have seizures, you become unconscious, or you are unable to wake up. °HOW CAN I PREVENT A HEAD INJURY IN THE FUTURE? °The most important factor for preventing major head injuries is avoiding motor vehicle accidents.  To minimize the potential for damage to your head, it is crucial to wear seat belts while riding in motor vehicles. Wearing helmets while bike riding and playing collision sports (like football) is also helpful. Also, avoiding dangerous activities around the house will further help reduce your risk of head injury.  °WHEN CAN I RETURN TO NORMAL ACTIVITIES AND ATHLETICS? °You should be reevaluated by your health care provider before returning to these activities. If you have any of the following symptoms, you should not return to activities or contact sports until 1 week after the symptoms have stopped: °· Persistent headache. °· Dizziness or vertigo. °· Poor attention and concentration. °· Confusion. °·   Memory problems.  Nausea or vomiting.  Fatigue or tire easily.  Irritability.  Intolerant of bright lights or loud noises.  Anxiety or depression.  Disturbed sleep. MAKE SURE YOU:   Understand these instructions.  Will watch your condition.  Will get help right away if you are not doing well or get worse. Document Released: 10/07/2005 Document Revised: 10/12/2013 Document Reviewed:  06/14/2013 [facility] Patient Information 2015 [place], [place]. This information is not intended to replace advice given to you by your health care provider. Make sure you discuss any questions you have with your health care provider.  Concussion A concussion, or closed-head injury, is a brain injury caused by a direct blow to the head or by a quick and sudden movement (jolt) of the head or neck. Concussions are usually not life-threatening. Even so, the effects of a concussion can be serious. If you have had a concussion before, you are more likely to experience concussion-like symptoms after a direct blow to the head.  CAUSES  Direct blow to the head, such as from running into another player during a soccer game, being hit in a fight, or hitting your head on a hard surface.  A jolt of the head or neck that causes the brain to move back and forth inside the skull, such as in a car crash. SIGNS AND SYMPTOMS The signs of a concussion can be hard to notice. Early on, they may be missed by you, family members, and health care providers. You may look fine but act or feel differently. Symptoms are usually temporary, but they may last for days, weeks, or even longer. Some symptoms may appear right away while others may not show up for hours or days. Every head injury is different. Symptoms include:  Mild to moderate headaches that will not go away.  A feeling of pressure inside your head.  Having more trouble than usual:  Learning or remembering things you have heard.  Answering questions.  Paying attention or concentrating.  Organizing daily tasks.  Making decisions and solving problems.  Slowness in thinking, acting or reacting, speaking, or reading.  Getting lost or being easily confused.  Feeling tired all the time or lacking energy (fatigued).  Feeling drowsy.  Sleep disturbances.  Sleeping more than usual.  Sleeping less than usual.  Trouble falling asleep.  Trouble  sleeping (insomnia).  Loss of balance or feeling lightheaded or dizzy.  Nausea or vomiting.  Numbness or tingling.  Increased sensitivity to:  Sounds.  Lights.  Distractions.  Vision problems or eyes that tire easily.  Diminished sense of taste or smell.  Ringing in the ears.  Mood changes such as feeling sad or anxious.  Becoming easily irritated or angry for little or no reason.  Lack of motivation.  Seeing or hearing things other people do not see or hear (hallucinations). DIAGNOSIS Your health care provider can usually diagnose a concussion based on a description of your injury and symptoms. He or she will ask whether you passed out (lost consciousness) and whether you are having trouble remembering events that happened right before and during your injury. Your evaluation might include:  A brain scan to look for signs of injury to the brain. Even if the test shows no injury, you may still have a concussion.  Blood tests to be sure other problems are not present. TREATMENT  Concussions are usually treated in an emergency department, in urgent care, or at a clinic. You may need to stay in the hospital overnight  for further treatment.  Tell your health care provider if you are taking any medicines, including prescription medicines, over-the-counter medicines, and natural remedies. Some medicines, such as blood thinners (anticoagulants) and aspirin, may increase the chance of complications. Also tell your health care provider whether you have had alcohol or are taking illegal drugs. This information may affect treatment.  Your health care provider will send you home with important instructions to follow.  How fast you will recover from a concussion depends on many factors. These factors include how severe your concussion is, what part of your brain was injured, your age, and how healthy you were before the concussion.  Most people with mild injuries recover fully.  Recovery can take time. In general, recovery is slower in older persons. Also, persons who have had a concussion in the past or have other medical problems may find that it takes longer to recover from their current injury. HOME CARE INSTRUCTIONS General Instructions  Carefully follow the directions your health care provider gave you.  Only take over-the-counter or prescription medicines for pain, discomfort, or fever as directed by your health care provider.  Take only those medicines that your health care provider has approved.  Do not drink alcohol until your health care provider says you are well enough to do so. Alcohol and certain other drugs may slow your recovery and can put you at risk of further injury.  If it is harder than usual to remember things, write them down.  If you are easily distracted, try to do one thing at a time. For example, do not try to watch TV while fixing dinner.  Talk with family members or close friends when making important decisions.  Keep all follow-up appointments. Repeated evaluation of your symptoms is recommended for your recovery.  Watch your symptoms and tell others to do the same. Complications sometimes occur after a concussion. Older adults with a brain injury may have a higher risk of serious complications, such as a blood clot on the brain.  Tell your teachers, school nurse, school counselor, coach, athletic trainer, or work Freight forwarder about your injury, symptoms, and restrictions. Tell them about what you can or cannot do. They should watch for:  Increased problems with attention or concentration.  Increased difficulty remembering or learning new information.  Increased time needed to complete tasks or assignments.  Increased irritability or decreased ability to cope with stress.  Increased symptoms.  Rest. Rest helps the brain to heal. Make sure you:  Get plenty of sleep at night. Avoid staying up late at night.  Keep the same  bedtime hours on weekends and weekdays.  Rest during the day. Take daytime naps or rest breaks when you feel tired.  Limit activities that require a lot of thought or concentration. These include:  Doing homework or job-related work.  Watching TV.  Working on the computer.  Avoid any situation where there is potential for another head injury (football, hockey, soccer, basketball, martial arts, downhill snow sports and horseback riding). Your condition will get worse every time you experience a concussion. You should avoid these activities until you are evaluated by the appropriate follow-up health care providers. Returning To Your Regular Activities You will need to return to your normal activities slowly, not all at once. You must give your body and brain enough time for recovery.  Do not return to sports or other athletic activities until your health care provider tells you it is safe to do so.  Ask your  health care provider when you can drive, ride a bicycle, or operate heavy machinery. Your ability to react may be slower after a brain injury. Never do these activities if you are dizzy.  Ask your health care provider about when you can return to work or school. Preventing Another Concussion It is very important to avoid another brain injury, especially before you have recovered. In rare cases, another injury can lead to permanent brain damage, brain swelling, or death. The risk of this is greatest during the first 7-10 days after a head injury. Avoid injuries by:  Wearing a seat belt when riding in a car.  Drinking alcohol only in moderation.  Wearing a helmet when biking, skiing, skateboarding, skating, or doing similar activities.  Avoiding activities that could lead to a second concussion, such as contact or recreational sports, until your health care provider says it is okay.  Taking safety measures in your home.  Remove clutter and tripping hazards from floors and  stairways.  Use grab bars in bathrooms and handrails by stairs.  Place non-slip mats on floors and in bathtubs.  Improve lighting in dim areas. SEEK MEDICAL CARE IF:  You have increased problems paying attention or concentrating.  You have increased difficulty remembering or learning new information.  You need more time to complete tasks or assignments than before.  You have increased irritability or decreased ability to cope with stress.  You have more symptoms than before. Seek medical care if you have any of the following symptoms for more than 2 weeks after your injury:  Lasting (chronic) headaches.  Dizziness or balance problems.  Nausea.  Vision problems.  Increased sensitivity to noise or light.  Depression or mood swings.  Anxiety or irritability.  Memory problems.  Difficulty concentrating or paying attention.  Sleep problems.  Feeling tired all the time. SEEK IMMEDIATE MEDICAL CARE IF:  You have severe or worsening headaches. These may be a sign of a blood clot in the brain.  You have weakness (even if only in one hand, leg, or part of the face).  You have numbness.  You have decreased coordination.  You vomit repeatedly.  You have increased sleepiness.  One pupil is larger than the other.  You have convulsions.  You have slurred speech.  You have increased confusion. This may be a sign of a blood clot in the brain.  You have increased restlessness, agitation, or irritability.  You are unable to recognize people or places.  You have neck pain.  It is difficult to wake you up.  You have unusual behavior changes.  You lose consciousness. MAKE SURE YOU:  Understand these instructions.  Will watch your condition.  Will get help right away if you are not doing well or get worse. Document Released: 12/28/2003 Document Revised: 10/12/2013 Document Reviewed: 04/29/2013 [facility] Patient Information 2015 [place], [place]. This  information is not intended to replace advice given to you by your health care provider. Make sure you discuss any questions you have with your health care provider.

## 2015-04-04 NOTE — ED Provider Notes (Signed)
CSN: [id number]     Arrival date & time 04/04/15  1721 History   First MD Initiated Contact with Patient 04/04/15 1959     Chief Complaint  Patient presents with  . Head Injury     (Consider location/radiation/quality/duration/timing/severity/associated sxs/prior Treatment) HPI The patient reports that she had been seated and stood up immediately becoming dizzy and lightheaded. This caused her to fall and hit the right side of her head. The patient denies that she had loss of consciousness. Her son was home with her and was immediately able to assist her back into her chair. Now she states she has no headache. She reports due to being on Eliquis, her doctor had told her she has to get checked anytime she has a fall. She states she also has a tender area on her right front rib. She qualifies it is mild and denies shortness of breath. She denies any other injury. The patient reports just for a few minutes after her fall, she had a hard time recalling recent events. She states that she was making herself remember the order of recent events. That memory deficit resolved after several minutes. Past Medical History  Diagnosis Date  . GERD (gastroesophageal reflux disease)   . Scoliosis   . Moderate to Severe Mitral Regurgitation with Mitral Valve Prolapse   . Paroxysmal atrial fibrillation     a. CHA2DS2VASc = 3-->eliquis 2.5 bid.  . Chronic combined systolic and diastolic CHF (congestive heart failure)     a. 03/2015 Echo: EF 40-45%, no rwma, Gr2 DD, mild AI, mod-sev MR, mildly dil LA, mod TR, PASP 50mmHg.   History reviewed. No pertinent past surgical history. Family History  Problem Relation Age of Onset  . Heart attack Father     in his 49s  . Diabetes Mother   . Heart attack Mother     lived to 55  . Heart attack Paternal Grandfather    History  Substance Use Topics  . Smoking status: Never Smoker   . Smokeless tobacco: Not on file  . Alcohol Use: Not on file   OB History    No  data available     Review of Systems  10 Systems reviewed and are negative for acute change except as noted in the HPI.   Allergies  Amiodarone  Home Medications   Prior to Admission medications   Medication Sig Start Date End Date Taking? Authorizing Provider  acetaminophen (TYLENOL) 500 MG tablet Take 500 mg by mouth every 6 (six) hours as needed for headache.    Historical Provider, MD  alendronate (FOSAMAX) 70 MG tablet Take 70 mg by mouth once a week. Take on Saturdays 01/25/15   Historical Provider, MD  apixaban (ELIQUIS) 2.5 MG TABS tablet Take 1 tablet (2.5 mg total) by mouth every 12 (twelve) hours. 03/15/15   Sherran Needs, NP  Calcium Carbonate-Vitamin D (CALCIUM + D PO) Take 1 tablet by mouth 2 (two) times daily.    Historical Provider, MD  carvedilol (COREG) 3.125 MG tablet Take 1 tablet (3.125 mg total) by mouth 2 (two) times daily with a meal. 04/02/15   Jones Bales, MD  famotidine (PEPCID) 20 MG tablet Take 1 tablet (20 mg total) by mouth daily as needed for heartburn or indigestion. 04/02/15   Jones Bales, MD  furosemide (LASIX) 20 MG tablet Take 1 tablet (20 mg total) by mouth daily. 04/02/15   Jones Bales, MD  lisinopril (PRINIVIL,ZESTRIL) 2.5 MG tablet Take 1  tablet (2.5 mg total) by mouth at bedtime. 03/29/15   Sherran Needs, NP  Multiple Vitamin (MULTIVITAMIN WITH MINERALS) TABS Take 1 tablet by mouth daily.    Historical Provider, MD  omeprazole (PRILOSEC) 20 MG capsule Take 20 mg by mouth daily as needed (heartburn).     Historical Provider, MD   BP 109/64 mmHg  Pulse 79  Temp(Src) 98.2 F (36.8 C) (Oral)  Resp 23  SpO2 92% Physical Exam  Constitutional: She is oriented to person, place, and time.  The patient is alert and nontoxic. GCS is 15. She is interactive with no respiratory distress.  HENT:  Head: Normocephalic and atraumatic.  Right Ear: External ear normal.  Left Ear: External ear normal.  Nose: Nose normal.  Mouth/Throat:  Oropharynx is clear and moist.  Eyes: EOM are normal. Pupils are equal, round, and reactive to light.  Neck:  No C-spine tenderness.  Cardiovascular: Normal rate, regular rhythm and intact distal pulses.   4\5 systolic ejection murmur.  Pulmonary/Chest: Effort normal and breath sounds normal. No respiratory distress. She has no wheezes.  The patient has significant kyphosis. There is mild tenderness over the right anterior chest wall. There is no crepitus, no abrasion  Abdominal: Soft. Bowel sounds are normal. She exhibits no distension. There is no tenderness.  Musculoskeletal: She exhibits no edema or tenderness.  Patient does not have hip tenderness. The upper extremity use and range of motion is intact. Family use and range of motion intact to baseline. Patient does have an ecchymosis on the right forearm approximately 10 cm oval. She states this is actually from prior IV during prior admission.  Neurological: She is alert and oriented to person, place, and time. No cranial nerve deficit. She exhibits normal muscle tone. Coordination normal.  Skin: Skin is warm and dry.  Psychiatric: She has a normal mood and affect.    ED Course  Procedures (including critical care time) Labs Review Labs Reviewed - No data to display  Imaging Review Ct Head Wo Contrast  04/04/2015   CLINICAL DATA:  Fall today.  On blood thinner.  EXAM: CT HEAD WITHOUT CONTRAST  TECHNIQUE: Contiguous axial images were obtained from the base of the skull through the vertex without intravenous contrast.  COMPARISON:  CT head 09/30/2012  FINDINGS: Moderate atrophy has progressed in the interval. Extensive chronic microvascular ischemia with progression from the prior study.  Negative for acute infarct. Negative for intracranial hemorrhage or mass  Mucosal edema right posterior ethmoid and sphenoid sinus unchanged from the prior study.  Negative for skull fracture.  IMPRESSION: No acute intracranial abnormality  Progression of  atrophy and chronic microvascular ischemia since 2013.   Electronically Signed   By: Franchot Gallo M.D.   On: 04/04/2015 20:53     EKG Interpretation   Date/Time:  Tuesday April 04 2015 19:50:55 EDT Ventricular Rate:  80 PR Interval:  208 QRS Duration: 89 QT Interval:  396 QTC Calculation: 457 R Axis:   30 Text Interpretation:  Sinus rhythm Atrial premature complex Low voltage,  extremity leads Consider anterior infarct Confirmed by Johnney Killian, MD, Jeannie Done  [id number]) on 04/04/2015 9:02:09 PM      MDM   Final diagnoses:  Fall, initial encounter  Closed head injury with concussion, without loss of consciousness, initial encounter   Patient has taken a fall on [address]. At this time no evidence of intracranial bleeding. Patient does not have headache. She had a brief period of concussive type symptoms with amnesia but  no mental confusion or focal neurologic deficit or dysfunction. This resolved with the patient to normal cognitive function. The patient felt that her chest wall tenderness was very mild and refused x-rays to further assess. She shows no signs of dyspnea the chest wall does not have deformity. Or crepitus that would suggest any displaced fracture. The patient is counseled to return with any development of headache, confusion, respiratory symptoms or any other concerning symptoms. She lives with family who has her under supervision and is available for assistance and monitoring.    Charlesetta Shanks, MD 04/04/15 2132

## 2015-04-04 NOTE — ED Notes (Signed)
Patient transported to CT 

## 2015-04-04 NOTE — ED Notes (Signed)
Pt in stating she tripped at home and fell and hit her head, pt takes a blood thinner, denies LOC, denies pain or vomiting since

## 2015-04-05 ENCOUNTER — Encounter: Payer: Self-pay | Admitting: Nurse Practitioner

## 2015-04-05 ENCOUNTER — Ambulatory Visit (INDEPENDENT_AMBULATORY_CARE_PROVIDER_SITE_OTHER): Payer: Medicare Other | Admitting: Nurse Practitioner

## 2015-04-05 VITALS — BP 80/50 | HR 65 | Ht 60.0 in | Wt 99.0 lb

## 2015-04-05 DIAGNOSIS — I5043 Acute on chronic combined systolic (congestive) and diastolic (congestive) heart failure: Secondary | ICD-10-CM | POA: Diagnosis not present

## 2015-04-05 DIAGNOSIS — I951 Orthostatic hypotension: Secondary | ICD-10-CM

## 2015-04-05 DIAGNOSIS — I5042 Chronic combined systolic (congestive) and diastolic (congestive) heart failure: Secondary | ICD-10-CM

## 2015-04-05 DIAGNOSIS — I481 Persistent atrial fibrillation: Secondary | ICD-10-CM | POA: Diagnosis not present

## 2015-04-05 DIAGNOSIS — I340 Nonrheumatic mitral (valve) insufficiency: Secondary | ICD-10-CM | POA: Diagnosis not present

## 2015-04-05 DIAGNOSIS — I4819 Other persistent atrial fibrillation: Secondary | ICD-10-CM

## 2015-04-05 LAB — BASIC METABOLIC PANEL
BUN: 20 mg/dL (ref 6–23)
CHLORIDE: 88 meq/L — AB (ref 96–112)
CO2: 34 meq/L — AB (ref 19–32)
Calcium: 9.1 mg/dL (ref 8.4–10.5)
Creatinine, Ser: 0.87 mg/dL (ref 0.40–1.20)
GFR: 65.18 mL/min (ref >60.00)
GLUCOSE: 99 mg/dL (ref 70–99)
POTASSIUM: 4.1 meq/L (ref 3.5–5.1)
SODIUM: 127 meq/L — AB (ref 135–145)

## 2015-04-05 MED ORDER — FUROSEMIDE 20 MG PO TABS: 10.0000 mg | ORAL_TABLET | Freq: Every day | ORAL | Status: DC

## 2015-04-05 MED ORDER — CARVEDILOL 3.125 MG PO TABS: 1.5600 mg | ORAL_TABLET | Freq: Two times a day (BID) | ORAL | Status: DC

## 2015-04-05 NOTE — Progress Notes (Signed)
Patient Name: Christina Buckley Date of Encounter: 04/05/2015  Primary Care Provider:  Leonides Sake, MD Primary Cardiologist:  Jerilynn Mages. Croitoru, MD   Chief Complaint  81 year old female who presents for clinic follow-up following recent hospitalization related to chronic combined systolic and diastolic heart failure.  Past Medical History   Past Medical History  Diagnosis Date  . GERD (gastroesophageal reflux disease)   . Scoliosis   . Moderate to Severe Mitral Regurgitation with Mitral Valve Prolapse   . Paroxysmal atrial fibrillation     a. CHA2DS2VASc = 3-->eliquis 2.5 bid;  b. Amiodarone discontinued June 2016 secondary to nausea and vomiting.  Now rate controlled.   . Chronic combined systolic and diastolic CHF (congestive heart failure)     a. 03/2015 Echo: EF 40-45%, no rwma, Gr2 DD, mild AI, mod-sev MR, mildly dil LA, mod TR, PASP 38mmHg.   No past surgical history on file.  Allergies  Allergies  Allergen Reactions  . Amiodarone Nausea And Vomiting    HPI  81 year old female with the above problem list. She was recently admitted to [facility] between June 10 and June 13 related to nausea and vomiting in the setting of amiodarone therapy as well as mild volume overload. Patient has a history of paroxysmal atrial fibrillation status post cardioversion May 2016. She did revert back to atrial fibrillation and was placed on amiodarone therapy. Unfortunately, this resulted in significant nausea and vomiting. She presented to [facility] on the 10th and amiodarone was discontinued. She was felt to be mildly volume overloaded and she was successfully diuresed. She was discharged home on eliquis at 2.5 mg twice a day, low-dose beta blocker, ACE inhibitor, and Lasix therapy. Since discharge, she has been doing well from a volume standpoint. She has been weighed daily by her daughter and her weight has been between 96 and 97 pounds. Her daughter has been noticing that her blood pressures are  typically very low, often in the 80s. On June 14, patient stood quickly and felt acutely lightheaded. She did not lose consciousness but did lose balance, falling and striking her right forearm and also her head. She was taken to the emergency department for evaluation. There, CT of the head was negative for any acute intracranial process. There were no labs drawn. Today, patient's blood pressure is 80/50. She has not had any presyncope today. She denies PND, orthopnea, syncope, edema, or early satiety. Her appetite has not been great. She has not had any further nausea or vomiting since amiodarone was discontinued.  Home Medications  Prior to Admission medications   Medication Sig Start Date End Date Taking? Authorizing Provider  acetaminophen (TYLENOL) 500 MG tablet Take 500 mg by mouth every 6 (six) hours as needed for headache.   Yes Historical Provider, MD  apixaban (ELIQUIS) 2.5 MG TABS tablet Take 1 tablet (2.5 mg total) by mouth every 12 (twelve) hours. 03/15/15  Yes Sherran Needs, NP  carvedilol (COREG) 3.125 MG tablet Take 0.5 tablets (1.56 mg total) by mouth 2 (two) times daily with a meal. 04/05/15  Yes Rogelia Mire, NP  furosemide (LASIX) 20 MG tablet Take 0.5 tablets (10 mg total) by mouth daily. 04/05/15  Yes Rogelia Mire, NP  lisinopril (PRINIVIL,ZESTRIL) 2.5 MG tablet Take 1 tablet (2.5 mg total) by mouth at bedtime. 03/29/15  Yes Sherran Needs, NP    Review of Systems  As above, no further nausea and vomiting. She has had some orthostatic dizziness with a fall yesterday.  She denies dyspnea, chest pain, PND, orthopnea, syncope, edema, or early satiety.  All other systems reviewed and are otherwise negative except as noted above.  Physical Exam  VS:  BP 80/50 mmHg  Pulse 65  Ht 5' (1.524 m)  Wt 99 lb (44.906 kg)  BMI 19.33 kg/m2 , BMI Body mass index is 19.33 kg/(m^2). GEN: Well nourished, well developed, in no acute distress. HEENT: normal. Neck: Supple, no  JVD, carotid bruits, or masses. Cardiac: RRR, no rubs, or gallops. 3/6 systolic murmur heard throughout and loudest at the apex. No clubbing, cyanosis, edema.  Radials/DP/PT 2+ and equal bilaterally.  Respiratory:  Respirations regular and unlabored, diminished in the left base otherwise clear to auscultation. GI: Soft, nontender, nondistended, BS + x 4. MS: Bruising with small hematoma noted over the right forearm related to recent fall. Skin: warm and dry, no rash. Neuro:  Strength and sensation are intact. Psych: Normal affect.  Accessory Clinical Findings  ECG - regular sinus rhythm with a first-degree AV block, 64 bpm, no acute ST or T changes.  Assessment & Plan  1.  Chronic combined systolic and diastolic congestive heart failure: Status post recent admission and diuresis. Weight has been stable between 96 and 97 pounds at home and she has not been having any dyspnea or orthopnea. Her blood pressure has been running low however and this has resulted in orthostatic dizziness and a fall yesterday. Blood pressures 80/50 today. I have recommended that she cut her carvedilol down to 3.125 mg half a tablet twice a day and also reduce Lasix to 20 mg half a tablet daily. We will keep her lisinopril at 2.5 mg daily for the time being and her daughter has been instructed to check her blood pressure prior to administering medications in the morning and if systolic is less than 90 to hold carvedilol and lisinopril. We also discussed the importance of daily weights and especially in light of adjusting Lasix dosing. Patient's daughter will notify us if she has ongoing orthostasis and/or volume increase. She has a follow-up with Dr. Sallyanne Kuster next week at which point we can readdress her orthostasis and make adjustments if necessary. I will check a basic metabolic panel today to reassess renal function in light of potential overdiuresis.  2. Moderate to severe mitral regurgitation: Certainly playing a role  #1. Patient and family to continue to monitor volume as above.  3. Atrial fibrillation, persistent: This is rate controlled on low-dose beta blocker therapy. She is also on eliquis at 2.5 mg twice a day in the setting of advanced age and low weight.   4. Orthostatic dizziness and relative hypotension: As above, I am reducing carvedilol to a half a tablet twice a day and also Lasix to half a tablet daily. We may ultimately need to discontinue lisinopril therapy if she remains hypotensive and/or symptomatic. Given atrial fibrillation, would prefer not to discontinue beta blocker at this time though we may need to switch her to metoprolol to allow for more blood pressure room.  5. Disposition: Follow-up with Dr. Sallyanne Kuster next week.  Murray Hodgkins, NP 04/05/2015, 12:14 PM

## 2015-04-05 NOTE — Patient Instructions (Addendum)
Medication Instructions:  Your physician has recommended you make the following change in your medication:   1-Decrease Coreg to 1.56mg  (1/2 tablet ) by mouth twice daily with meals  2-Decrease Lasix to 10 mg by mouth daily   Labwork: Your physician recommends that you have labs today. BMET  Testing/Procedures: NONE  Follow-Up: Your physician recommends that you keep your follow up appointment as scheduled.  Any Other Special Instructions Will Be Listed Below (If Applicable).

## 2015-04-10 DIAGNOSIS — I5043 Acute on chronic combined systolic (congestive) and diastolic (congestive) heart failure: Secondary | ICD-10-CM | POA: Diagnosis not present

## 2015-04-13 ENCOUNTER — Encounter: Payer: Self-pay | Admitting: Cardiovascular Disease

## 2015-04-13 ENCOUNTER — Ambulatory Visit (INDEPENDENT_AMBULATORY_CARE_PROVIDER_SITE_OTHER): Payer: Medicare Other | Admitting: Cardiovascular Disease

## 2015-04-13 VITALS — BP 112/72 | HR 75 | Ht 60.0 in | Wt 99.6 lb

## 2015-04-13 DIAGNOSIS — I480 Paroxysmal atrial fibrillation: Secondary | ICD-10-CM | POA: Diagnosis not present

## 2015-04-13 DIAGNOSIS — I5043 Acute on chronic combined systolic (congestive) and diastolic (congestive) heart failure: Secondary | ICD-10-CM

## 2015-04-13 DIAGNOSIS — I341 Nonrheumatic mitral (valve) prolapse: Secondary | ICD-10-CM | POA: Diagnosis not present

## 2015-04-13 DIAGNOSIS — I340 Nonrheumatic mitral (valve) insufficiency: Secondary | ICD-10-CM | POA: Diagnosis not present

## 2015-04-13 DIAGNOSIS — M419 Scoliosis, unspecified: Secondary | ICD-10-CM

## 2015-04-13 NOTE — Patient Instructions (Signed)
Dr. Croitoru recommends that you schedule a follow-up appointment in: 3 MONTHS   

## 2015-04-13 NOTE — Progress Notes (Signed)
Patient ID: Christina Buckley, female   DOB: [birth year], 81 y.o.   MRN: [id number]      Cardiology Office Note   Date:  04/15/2015   ID:  Christina Buckley, DOB 08-Aug-1924, MRN [id number]  PCP:  Leonides Sake, MD  Cardiologist:   Sanda Klein, MD   Chief Complaint  Patient presents with  . Follow-up    daughter wants to know if she needs to be taking eliquis. pt c/o SOB  and being light headed       History of Present Illness: Christina Buckley is a 81 y.o. female who presents for  Follow-up after hospitalization for congestive heart failure in the setting of severe mitral insufficiency due to mitral valve prolapse and recurrent paroxysmal atrial fibrillation and atrial tachycardia. She has moderately depressed left ventricular systolic function with a left ventricular ejection fraction of around 40-45 percent , probably related to valvular heart disease. Treatment of her arrhythmia was challenging since she developed nausea and vomiting with amiodarone, leading to hospitalization.. This medication has now been discontinued. Similarly, management of heart failure has been limited by hypotension. She is on tiny doses of carvedilol, lisinopril and furosemide.   She has tolerated anticoagulation without serious bleeding events but a few days ago fell at home and has a small forearm hematoma. She had head injury and brief amnesia for the events,  But no intracranial bleeding or other abnormalities. She uses a walker and is generally fairly stable on her feet.  The fall was probably related to orthostatic hypotension.  She has severe osteoporosis-related kyphoscoliosis and it is quite likely she has some degree of restrictive lung disease as well.   she has mild dyspnea and as before develops a cough when she is fluid overloaded. She is occasionally lightheaded. She does not have chest pain and has not experienced syncope.   her son and daughter are both present at the appointment today. The  patient and her children all agree that the focus is primarily on maintaining good quality of life. There is no intention to pursue invasive cardiac evaluation or valve surgery.  We had a lengthy discussion regarding the pros and cons of anticoagulation therapy. She is both at high risk of stroke and high risk of bleeding complications.   Past Medical History  Diagnosis Date  . GERD (gastroesophageal reflux disease)   . Scoliosis   . Moderate to Severe Mitral Regurgitation with Mitral Valve Prolapse   . Paroxysmal atrial fibrillation     a. CHA2DS2VASc = 3-->eliquis 2.5 bid;  b. Amiodarone discontinued June 2016 secondary to nausea and vomiting.  Now rate controlled.   . Chronic combined systolic and diastolic CHF (congestive heart failure)     a. 03/2015 Echo: EF 40-45%, no rwma, Gr2 DD, mild AI, mod-sev MR, mildly dil LA, mod TR, PASP 31mmHg.    No past surgical history on file.   Current Outpatient Prescriptions  Medication Sig Dispense Refill  . acetaminophen (TYLENOL) 500 MG tablet Take 500 mg by mouth every 6 (six) hours as needed for headache.    Marland Kitchen apixaban (ELIQUIS) 2.5 MG TABS tablet Take 1 tablet (2.5 mg total) by mouth every 12 (twelve) hours. 60 tablet 3  . carvedilol (COREG) 3.125 MG tablet Take 0.5 tablets (1.56 mg total) by mouth 2 (two) times daily with a meal. 60 tablet 1  . furosemide (LASIX) 20 MG tablet Take 0.5 tablets (10 mg total) by mouth daily. 30 tablet 1  . lisinopril (PRINIVIL,ZESTRIL) 2.5  MG tablet Take 1 tablet (2.5 mg total) by mouth at bedtime. 30 tablet 3   No current facility-administered medications for this visit.    Allergies:   Amiodarone    Social History:  The patient  reports that she has never smoked. She does not have any smokeless tobacco history on file.   Family History:  The patient's family history includes Diabetes in her mother; Heart attack in her father, mother, and paternal grandfather.    ROS:  Please see the history of present  illness.    Otherwise, review of systems positive for  Fatigue , poor appetite.   All other systems are reviewed and negative.    PHYSICAL EXAM: VS:  BP 112/72 mmHg  Pulse 75  Ht 5' (1.524 m)  Wt 99 lb 9.6 oz (45.178 kg)  BMI 19.45 kg/m2 , BMI Body mass index is 19.45 kg/(m^2).  General: Alert, oriented x3, no distress Head: no evidence of trauma, PERRL, EOMI, no exophtalmos or lid lag, no myxedema, no xanthelasma; normal ears, nose and oropharynx Neck: normal jugular venous pulsations  With prominent V waves and no hepatojugular reflux; brisk carotid pulses without delay and no carotid bruits Chest: clear to auscultation, no signs of consolidation by percussion or palpation, normal fremitus, symmetrical and full respiratory excursions Cardiovascular: normal position and quality of the apical impulse, regular rhythm, normal first and second heart sounds, 3/6  [place] holosystolic and left lower sternal border murmurs, no rubs; S3 gallop is present Abdomen: no tenderness or distention, no masses by palpation, no abnormal pulsatility or arterial bruits, normal bowel sounds, no hepatosplenomegaly Extremities: no clubbing, cyanosis or edema; 2+ radial, ulnar and brachial pulses bilaterally; 2+ right femoral, posterior tibial and dorsalis pedis pulses; 2+ left femoral, posterior tibial and dorsalis pedis pulses; no subclavian or femoral bruits Neurological: grossly nonfocal Psych: euthymic mood, full affect   EKG:  EKG is ordered today.  Normal sinus rhythm, QTC 442 ms   Recent Labs: 02/19/2015: TSH 2.463 03/31/2015: ALT 13*; B Natriuretic Peptide 160.3*; Hemoglobin 14.2; Platelets 239 04/02/2015: Magnesium 1.7 04/05/2015: BUN 20; Creatinine, Ser 0.87; Potassium 4.1; Sodium 127*    Lipid Panel No results found for: CHOL, TRIG, HDL, CHOLHDL, VLDL, LDLCALC, LDLDIRECT    Wt Readings from Last 3 Encounters:  04/13/15 99 lb 9.6 oz (45.178 kg)  04/05/15 99 lb (44.906 kg)  04/02/15 96 lb 14.4  oz (43.954 kg)     ASSESSMENT AND PLAN:  As long as she remains in normal sinus rhythm, [name] seems to be tolerating her heart failure symptoms reasonably well. Unfortunately she is not a candidate for mitral valve surgery and is poorly tolerant of heart failure medications due to low blood pressure. She is no longer on antiarrhythmic therapy. It is possible we will see a recrudescence of atrial fibrillation events as the amiodarone wears off. Very few options remain.   the risk/benefit ratio of anticoagulation is also a serious question. She has had one serious fall , thankfully without adverse consequences.   At this point, her quality of life remains fair. She i\has very good support from her children. If her hemodynamic condition worsens we may have to move her care in the direction of pure palliative care, in which case I would recommend discontinuation of the anti-coagulant andheart failure medications with the exception of furosemide   Current medicines are reviewed at length with the patient today.  The patient has concerns regarding medicines.  The following changes have been made:  no change  Labs/ tests ordered today include:  No orders of the defined types were placed in this encounter.   Patient Instructions  Dr. Sallyanne Kuster recommends that you schedule a follow-up appointment in: Anderson, Emmert Roethler, MD  04/15/2015 9:44 AM    Sanda Klein, MD, [facility] [phone] office [phone] pager

## 2015-04-15 DIAGNOSIS — M419 Scoliosis, unspecified: Secondary | ICD-10-CM | POA: Insufficient documentation

## 2015-05-24 ENCOUNTER — Other Ambulatory Visit: Payer: Self-pay | Admitting: Pharmacist Clinician (PhC)/ Clinical Pharmacy Specialist

## 2015-05-24 MED ORDER — APIXABAN 2.5 MG PO TABS: 2.5000 mg | ORAL_TABLET | Freq: Two times a day (BID) | ORAL | Status: DC

## 2015-06-20 ENCOUNTER — Other Ambulatory Visit: Payer: Self-pay | Admitting: Internal Medicine

## 2015-06-21 ENCOUNTER — Telehealth: Payer: Self-pay | Admitting: *Deleted

## 2015-06-21 NOTE — Telephone Encounter (Signed)
Patient approved for the patient assistance program through [employer] for Eliquis from 06/16/2015 through 10/21/2015.

## 2015-06-27 ENCOUNTER — Other Ambulatory Visit: Payer: Self-pay | Admitting: Internal Medicine

## 2015-06-27 DIAGNOSIS — Z681 Body mass index (BMI) 19 or less, adult: Secondary | ICD-10-CM | POA: Diagnosis not present

## 2015-06-27 DIAGNOSIS — K219 Gastro-esophageal reflux disease without esophagitis: Secondary | ICD-10-CM | POA: Diagnosis not present

## 2015-06-27 DIAGNOSIS — M810 Age-related osteoporosis without current pathological fracture: Secondary | ICD-10-CM | POA: Diagnosis not present

## 2015-06-27 DIAGNOSIS — Z1389 Encounter for screening for other disorder: Secondary | ICD-10-CM | POA: Diagnosis not present

## 2015-06-27 DIAGNOSIS — E780 Pure hypercholesterolemia: Secondary | ICD-10-CM | POA: Diagnosis not present

## 2015-06-27 DIAGNOSIS — I5043 Acute on chronic combined systolic (congestive) and diastolic (congestive) heart failure: Secondary | ICD-10-CM | POA: Diagnosis not present

## 2015-06-27 DIAGNOSIS — Z79899 Other long term (current) drug therapy: Secondary | ICD-10-CM | POA: Diagnosis not present

## 2015-06-27 DIAGNOSIS — Z9181 History of falling: Secondary | ICD-10-CM | POA: Diagnosis not present

## 2015-06-27 DIAGNOSIS — I4891 Unspecified atrial fibrillation: Secondary | ICD-10-CM | POA: Diagnosis not present

## 2015-07-03 ENCOUNTER — Other Ambulatory Visit: Payer: Self-pay | Admitting: Internal Medicine

## 2015-07-20 ENCOUNTER — Encounter: Payer: Self-pay | Admitting: Cardiovascular Disease

## 2015-07-20 ENCOUNTER — Ambulatory Visit (INDEPENDENT_AMBULATORY_CARE_PROVIDER_SITE_OTHER): Payer: Medicare Other | Admitting: Cardiovascular Disease

## 2015-07-20 VITALS — BP 124/80 | HR 82 | Ht 60.0 in | Wt 94.8 lb

## 2015-07-20 DIAGNOSIS — I480 Paroxysmal atrial fibrillation: Secondary | ICD-10-CM | POA: Diagnosis not present

## 2015-07-20 DIAGNOSIS — Z23 Encounter for immunization: Secondary | ICD-10-CM

## 2015-07-20 DIAGNOSIS — I340 Nonrheumatic mitral (valve) insufficiency: Secondary | ICD-10-CM | POA: Diagnosis not present

## 2015-07-20 DIAGNOSIS — I341 Nonrheumatic mitral (valve) prolapse: Secondary | ICD-10-CM | POA: Diagnosis not present

## 2015-07-20 MED ORDER — APIXABAN 2.5 MG PO TABS: 2.5000 mg | ORAL_TABLET | Freq: Two times a day (BID) | ORAL | Status: DC

## 2015-07-20 MED ORDER — FUROSEMIDE 20 MG PO TABS: 10.0000 mg | ORAL_TABLET | Freq: Every day | ORAL | Status: DC

## 2015-07-20 MED ORDER — CARVEDILOL 3.125 MG PO TABS: 1.5600 mg | ORAL_TABLET | Freq: Two times a day (BID) | ORAL | Status: DC

## 2015-07-20 MED ORDER — LISINOPRIL 2.5 MG PO TABS: 2.5000 mg | ORAL_TABLET | Freq: Every day | ORAL | Status: DC

## 2015-07-20 NOTE — Patient Instructions (Signed)
Dr. Croitoru recommends that you schedule a follow-up appointment in: 6 MONTHS   

## 2015-07-20 NOTE — Progress Notes (Signed)
Patient ID: Christina Buckley, female   DOB: [birth year], 81 y.o.   MRN: [id number]      Cardiology Office Note   Date:  07/20/2015   ID:  Christina Buckley, DOB 31-Mar-1924, MRN [id number]  PCP:  Leonides Sake, MD  Cardiologist:   Sanda Klein, MD   No chief complaint on file.     History of Present Illness: Christina Buckley is a 81 y.o. female who presents for  Follow-up for chronic systolic and diastolic heart failure and severe mitral insufficiency and paroxysmal atrial fibrillation.  She is really doing substantially better. She is taking care of her own housework. She doesn't feel that she needs oxygen anymore. She occasionally feels that her rhythm is irregular , but this does not associate worsening dyspnea, dizziness or chest discomfort. She does not have orthopnea or lower extremity edema. She is rarely dizzy.  Her cough has resolved.  In retrospect, I think it is quite evident that in May she had abrupt onset of severe mitral insufficiency probably due to chordal rupture leading to acute decompensated heart failure and frequent atrial fibrillation with rapid ventricular rates. It seems that her heart has adjusted to the new volume loading condition, probably with increased compliance of her left heart chambers and better compensation of heart failure.  She was intolerant of amiodarone due to nausea and vomiting and her small body size and naturally low blood pressure have limited the use of conventional heart failure medication.  Past Medical History  Diagnosis Date  . GERD (gastroesophageal reflux disease)   . Scoliosis   . Moderate to Severe Mitral Regurgitation with Mitral Valve Prolapse   . Paroxysmal atrial fibrillation     a. CHA2DS2VASc = 3-->eliquis 2.5 bid;  b. Amiodarone discontinued June 2016 secondary to nausea and vomiting.  Now rate controlled.   . Chronic combined systolic and diastolic CHF (congestive heart failure)     a. 03/2015 Echo: EF 40-45%, no rwma, Gr2  DD, mild AI, mod-sev MR, mildly dil LA, mod TR, PASP 53mmHg.    No past surgical history on file.   Current Outpatient Prescriptions  Medication Sig Dispense Refill  . acetaminophen (TYLENOL) 500 MG tablet Take 500 mg by mouth every 6 (six) hours as needed for headache.    Marland Kitchen apixaban (ELIQUIS) 2.5 MG TABS tablet Take 1 tablet (2.5 mg total) by mouth every 12 (twelve) hours. 180 tablet 3  . carvedilol (COREG) 3.125 MG tablet Take 0.5 tablets (1.56 mg total) by mouth 2 (two) times daily with a meal. 45 tablet 3  . furosemide (LASIX) 20 MG tablet Take 0.5 tablets (10 mg total) by mouth daily. 45 tablet 3  . lisinopril (PRINIVIL,ZESTRIL) 2.5 MG tablet Take 1 tablet (2.5 mg total) by mouth at bedtime. 90 tablet 3   No current facility-administered medications for this visit.    Allergies:   Amiodarone    Social History:  The patient  reports that she has never smoked. She does not have any smokeless tobacco history on file.   Family History:  The patient's family history includes Diabetes in her mother; Heart attack in her father, mother, and paternal grandfather.    ROS:  Please see the history of present illness.    Otherwise, review of systems positive for none.   All other systems are reviewed and negative.    PHYSICAL EXAM: VS:  BP 124/80 mmHg  Pulse 82  Ht 5' (1.524 m)  Wt 94 lb 12.8 oz (43.001 kg)  BMI 18.51 kg/m2 , BMI Body mass index is 18.51 kg/(m^2).  General: Alert, oriented x3, no distress Head: no evidence of trauma, PERRL, EOMI, no exophtalmos or lid lag, no myxedema, no xanthelasma; normal ears, nose and oropharynx Neck: normal jugular venous pulsations and no hepatojugular reflux; brisk carotid pulses without delay and no carotid bruits Chest: clear to auscultation, no signs of consolidation by percussion or palpation, normal fremitus, symmetrical and full respiratory excursions;  Prominent kyphoscoliosis Cardiovascular: normal position and quality of the apical  impulse, regular rhythm, normal first and second heart sounds,  3/6 holosystolic murmur heard both at the apex in the base and radiating deep into the axilla, no diastolic murmurs, rubs or gallops Abdomen: no tenderness or distention, no masses by palpation, no abnormal pulsatility or arterial bruits, normal bowel sounds, no hepatosplenomegaly Extremities: no clubbing, cyanosis or edema; 2+ radial, ulnar and brachial pulses bilaterally; 2+ right femoral, posterior tibial and dorsalis pedis pulses; 2+ left femoral, posterior tibial and dorsalis pedis pulses; no subclavian or femoral bruits Neurological: grossly nonfocal Psych: euthymic mood, full affect   EKG:  EKG is not ordered today.   Recent Labs: 02/19/2015: TSH 2.463 03/31/2015: ALT 13*; B Natriuretic Peptide 160.3*; Hemoglobin 14.2; Platelets 239 04/02/2015: Magnesium 1.7 04/05/2015: BUN 20; Creatinine, Ser 0.87; Potassium 4.1; Sodium 127*    Lipid Panel No results found for: CHOL, TRIG, HDL, CHOLHDL, VLDL, LDLCALC, LDLDIRECT    Wt Readings from Last 3 Encounters:  07/20/15 94 lb 12.8 oz (43.001 kg)  04/13/15 99 lb 9.6 oz (45.178 kg)  04/05/15 99 lb (44.906 kg)    .   ASSESSMENT AND PLAN:  1.  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 Mrs. Brenner is tolerating this much better , likely due to adaptation in left ventricular and left atrial compliance and probably gradual left ventricular dilatation. She is not a candidate for mitral valve surgery due to her advanced age , prominent kyphoscoliosis and overall frailty. Percutaneous mitral clip is considered , but she has declined invasive /aggressive procedures.  2.  Chronic combined systolic and diastolic heart failure, euvolemic, NYHA functional class II. It is possible that left ventricular systolic function has also shown some recovery. Honestly, I don't think reevaluation of LV size and function or the severity of MR will make any difference in  our choices of therapy.  3. Paroxysmal atrial fibrillation with reduced frequency of clinical symptoms. Intolerant of amiodarone. She has easy bruising on Eliquis but has not had any serious bleeding complications.    Current medicines are reviewed at length with the patient today.  The patient does not have concerns regarding medicines.  The following changes have been made:  no change  Labs/ tests ordered today include:  Orders Placed This Encounter  Procedures  . Flu Vaccine QUAD 36+ mos IM    Patient Instructions  Dr. Sallyanne Kuster recommends that you schedule a follow-up appointment in: 6 MONTHS       Signed, CROITORU,MIHAI, MD  07/20/2015 12:51 PM    Sanda Klein, MD, [facility] [phone] office [phone] pager

## 2015-07-23 ENCOUNTER — Other Ambulatory Visit: Payer: Self-pay | Admitting: Internal Medicine

## 2015-09-16 ENCOUNTER — Other Ambulatory Visit: Payer: Self-pay | Admitting: Internal Medicine

## 2015-12-26 DIAGNOSIS — E7800 Pure hypercholesterolemia, unspecified: Secondary | ICD-10-CM | POA: Diagnosis not present

## 2015-12-26 DIAGNOSIS — I4891 Unspecified atrial fibrillation: Secondary | ICD-10-CM | POA: Diagnosis not present

## 2015-12-26 DIAGNOSIS — I5043 Acute on chronic combined systolic (congestive) and diastolic (congestive) heart failure: Secondary | ICD-10-CM | POA: Diagnosis not present

## 2015-12-26 DIAGNOSIS — R636 Underweight: Secondary | ICD-10-CM | POA: Diagnosis not present

## 2015-12-26 DIAGNOSIS — Z681 Body mass index (BMI) 19 or less, adult: Secondary | ICD-10-CM | POA: Diagnosis not present

## 2015-12-26 DIAGNOSIS — Z79899 Other long term (current) drug therapy: Secondary | ICD-10-CM | POA: Diagnosis not present

## 2015-12-29 ENCOUNTER — Encounter: Payer: Self-pay | Admitting: Cardiovascular Disease

## 2016-01-17 ENCOUNTER — Encounter: Payer: Self-pay | Admitting: Cardiovascular Disease

## 2016-01-17 ENCOUNTER — Ambulatory Visit (INDEPENDENT_AMBULATORY_CARE_PROVIDER_SITE_OTHER): Payer: Medicare Other | Admitting: Cardiovascular Disease

## 2016-01-17 VITALS — BP 98/58 | HR 66 | Ht 60.0 in | Wt 97.0 lb

## 2016-01-17 DIAGNOSIS — I5043 Acute on chronic combined systolic (congestive) and diastolic (congestive) heart failure: Secondary | ICD-10-CM | POA: Diagnosis not present

## 2016-01-17 DIAGNOSIS — I5042 Chronic combined systolic (congestive) and diastolic (congestive) heart failure: Secondary | ICD-10-CM | POA: Diagnosis not present

## 2016-01-17 DIAGNOSIS — I480 Paroxysmal atrial fibrillation: Secondary | ICD-10-CM

## 2016-01-17 DIAGNOSIS — I340 Nonrheumatic mitral (valve) insufficiency: Secondary | ICD-10-CM | POA: Diagnosis not present

## 2016-01-17 NOTE — Patient Instructions (Signed)
Your physician recommends that you continue on your current medications as directed. Please refer to the Current Medication list given to you today.  Dr Croitoru recommends that you schedule a follow-up appointment in 6 months. You will receive a reminder letter in the mail two months in advance. If you don't receive a letter, please call our office to schedule the follow-up appointment.  If you need a refill on your cardiac medications before your next appointment, please call your pharmacy. 

## 2016-01-17 NOTE — Progress Notes (Signed)
Patient ID: Christina Buckley, female   DOB: [birth year], 82 y.o.   MRN: [id number]    Cardiology Office Note    Date:  01/19/2016   ID:  Christina Buckley, DOB 1924/03/14, MRN [id number]  PCP:  Leonides Sake, MD  Cardiologist:   Sanda Klein, MD   Chief Complaint  Patient presents with  . Follow-up    no chest pain, shortness of breath with exertion, no edema, no oain or cramping in legs, no lightheaded ior dizziness    History of Present Illness:  Christina Buckley is a 82 y.o. female with severe mitral insufficiency due to mitral valve prolapse, recurrent paroxysmal atrial fibrillation and secondary heart failure.  She seems to have settled into a much more stable New York Heart Association functional class II pattern of exertional dyspnea. She has occasional palpitations that she tolerates well. She does not have leg edema and denies angina dizziness, syncope, orthopnea or PND  Last year she had a serious acute illness, likely due to acute worsening of mitral insufficiency from acute chordal rupture. She had a lot of problems with arrhythmia and heart to control heart failure. Things have gradually improved, likely as she has developed gradual left ventricular enlargement to compensate for the acute mitral insufficiency. She is tolerating anticoagulation with Eliquis without complications of bleeding or neurological events.  Low blood pressure and small body habitus have limited the use of heart failure medications. She did not tolerate amiodarone due to intractable nausea and vomiting  May 2016 Echo - Left ventricle: The cavity size was normal. There was focal basal  hypertrophy. Systolic function was mildly to moderately reduced.  The estimated ejection fraction was in the range of 40% to 45%.  Wall motion was normal; there were no regional wall motion  abnormalities. Features are consistent with a pseudonormal left  ventricular filling pattern, with concomitant abnormal  relaxation  and increased filling pressure (grade 2 diastolic dysfunction). - Aortic valve: There was mild regurgitation. - Mitral valve: Prolapse. There was moderate to severe  regurgitation. - Left atrium: The atrium was mildly dilated. - Tricuspid valve: There was moderate regurgitation. - Pulmonary arteries: Systolic pressure was moderately increased.  PA peak pressure: 48 mm Hg (S).  Past Medical History  Diagnosis Date  . GERD (gastroesophageal reflux disease)   . Scoliosis   . Moderate to Severe Mitral Regurgitation with Mitral Valve Prolapse   . Paroxysmal atrial fibrillation (HCC)     a. CHA2DS2VASc = 3-->eliquis 2.5 bid;  b. Amiodarone discontinued June 2016 secondary to nausea and vomiting.  Now rate controlled.   . Chronic combined systolic and diastolic CHF (congestive heart failure) ([place])     a. 03/2015 Echo: EF 40-45%, no rwma, Gr2 DD, mild AI, mod-sev MR, mildly dil LA, mod TR, PASP 52mmHg.    No past surgical history on file.  Current Medications: Outpatient Prescriptions Prior to Visit  Medication Sig Dispense Refill  . acetaminophen (TYLENOL) 500 MG tablet Take 500 mg by mouth every 6 (six) hours as needed for headache.    Marland Kitchen apixaban (ELIQUIS) 2.5 MG TABS tablet Take 1 tablet (2.5 mg total) by mouth every 12 (twelve) hours. 180 tablet 3  . carvedilol (COREG) 3.125 MG tablet Take 0.5 tablets (1.56 mg total) by mouth 2 (two) times daily with a meal. 45 tablet 3  . furosemide (LASIX) 20 MG tablet Take 0.5 tablets (10 mg total) by mouth daily. 45 tablet 3  . lisinopril (PRINIVIL,ZESTRIL) 2.5 MG tablet Take 1  tablet (2.5 mg total) by mouth at bedtime. 90 tablet 3   No facility-administered medications prior to visit.     Allergies:   Amiodarone   Social History   Social History  . Marital Status: Married    Spouse Name: N/A  . Number of Children: N/A  . Years of Education: N/A   Occupational History  . 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 Smoking status: Never Smoker   . Smokeless tobacco: None  . Alcohol Use: None  . Drug Use: None  . Sexual Activity: Not Asked   Other Topics Concern  . None   Social History Narrative   Lives at home alone, husband passed away last year.  Has multiple children nearby who look in on her daily.     Family History:  The patient's family history includes Diabetes in her mother; Heart attack in her father, mother, and paternal grandfather.   ROS:   Please see the history of present illness.    ROS All other systems reviewed and are negative.   PHYSICAL EXAM:   VS:  BP 98/58 mmHg  Pulse 66  Ht 5' (1.524 m)  Wt 43.999 kg (97 lb)  BMI 18.94 kg/m2   GEN: Well nourished, well developed, in no acute distress HEENT: normal Neck: no JVD, carotid bruits, or masses Cardiac: RRR; 3/6 holosystolic murmur heard throughout the entire precordium and radiating to was the axilla no diastolic murmurs, rubs, or gallops,no edema  Respiratory:  clear to auscultation bilaterally, normal work of breathing GI: soft, nontender, nondistended, + BS MS: no deformity or atrophy Skin: warm and dry, no rash Neuro:  Alert and Oriented x 3, Strength and sensation are intact Psych: euthymic mood, full affect  Wt Readings from Last 3 Encounters:  01/17/16 43.999 kg (97 lb)  07/20/15 43.001 kg (94 lb 12.8 oz)  04/13/15 45.178 kg (99 lb 9.6 oz)      Studies/Labs Reviewed:   EKG:  EKG is ordered today.  The ekg ordered today demonstrates Sinus rhythm, only voltage criteria for LVH with repolarization abnormalities  Recent Labs: 02/19/2015: TSH 2.463 03/31/2015: ALT 13*; B Natriuretic Peptide 160.3*; Hemoglobin 14.2; Platelets 239 04/02/2015: Magnesium 1.7 04/05/2015: BUN 20; Creatinine, Ser 0.87; Potassium 4.1; Sodium 127*   Lipid Panel No results found for: CHOL, TRIG, HDL, CHOLHDL, VLDL, LDLCALC, LDLDIRECT    ASSESSMENT:    1. Chronic combined systolic and diastolic  heart failure ([place])   2. Severe mitral insufficiency   3. Paroxysmal atrial fibrillation (HCC)      PLAN:  In order of problems listed above:  1. CHF: NYHA functional class II clinically euvolemic. Her blood pressure precludes further increase in carvedilol or lisinopril 2. MR: her age and overall frailty make poor surgical candidate. She is real not interested in any interventions, even if they do not involve conventional open-heart surgery. 3. PAF: She has fairly frequent palpitations but seems to tolerate the arrhythmia without near syncope or worsening dyspnea. Poorly tolerant of amiodarone. I don't think she would do well with other antiarrhythmic medications. Continue low-dose beta blocker. Continue anticoagulation in dose adjusted for age and small body size.    Medication Adjustments/Labs and Tests Ordered: Current medicines are reviewed at length with the patient today.  Concerns regarding medicines are outlined above.  Medication changes, Labs and Tests ordered today are listed in the Patient Instructions below. Patient Instructions  Your physician recommends  that you continue on your current medications as directed. Please refer to the Current Medication list given to you today.  Dr Sallyanne Kuster recommends that you schedule a follow-up appointment in 6 months. You will receive a reminder letter in the mail two months in advance. If you don't receive a letter, please call our office to schedule the follow-up appointment.  If you need a refill on your cardiac medications before your next appointment, please call your pharmacy.    Mikael Spray, MD  01/19/2016 2:09 PM    [facility] [place], [place], [place]  [address] Phone: [phone]; Fax: [phone]

## 2016-01-19 DIAGNOSIS — I5042 Chronic combined systolic (congestive) and diastolic (congestive) heart failure: Secondary | ICD-10-CM | POA: Insufficient documentation

## 2016-03-12 ENCOUNTER — Other Ambulatory Visit: Payer: Self-pay | Admitting: Cardiovascular Disease

## 2016-03-12 NOTE — Telephone Encounter (Signed)
Rx(s) sent to pharmacy electronically.  

## 2016-03-19 ENCOUNTER — Other Ambulatory Visit: Payer: Self-pay | Admitting: Cardiovascular Disease

## 2016-03-19 NOTE — Telephone Encounter (Signed)
Rx has been sent to the pharmacy electronically. ° °

## 2016-05-09 ENCOUNTER — Emergency Department (HOSPITAL_COMMUNITY): Payer: Medicare Other

## 2016-05-09 ENCOUNTER — Emergency Department (HOSPITAL_COMMUNITY)
Admission: EM | Admit: 2016-05-09 | Discharge: 2016-05-09 | Disposition: A | Discharge status: Home / Self Care | Payer: Medicare Other | Attending: Emergency Medicine | Admitting: Emergency Medicine

## 2016-05-09 ENCOUNTER — Telehealth: Payer: Self-pay | Admitting: Cardiology

## 2016-05-09 ENCOUNTER — Encounter (HOSPITAL_COMMUNITY): Payer: Self-pay | Admitting: Emergency Medicine

## 2016-05-09 DIAGNOSIS — Z7901 Long term (current) use of anticoagulants: Secondary | ICD-10-CM | POA: Insufficient documentation

## 2016-05-09 DIAGNOSIS — R220 Localized swelling, mass and lump, head: Secondary | ICD-10-CM | POA: Insufficient documentation

## 2016-05-09 DIAGNOSIS — W010XXA Fall on same level from slipping, tripping and stumbling without subsequent striking against object, initial encounter: Secondary | ICD-10-CM | POA: Diagnosis not present

## 2016-05-09 DIAGNOSIS — Y929 Unspecified place or not applicable: Secondary | ICD-10-CM | POA: Diagnosis not present

## 2016-05-09 DIAGNOSIS — Y9301 Activity, walking, marching and hiking: Secondary | ICD-10-CM | POA: Insufficient documentation

## 2016-05-09 DIAGNOSIS — I5042 Chronic combined systolic (congestive) and diastolic (congestive) heart failure: Secondary | ICD-10-CM | POA: Insufficient documentation

## 2016-05-09 DIAGNOSIS — S12300A Unspecified displaced fracture of fourth cervical vertebra, initial encounter for closed fracture: Secondary | ICD-10-CM | POA: Diagnosis not present

## 2016-05-09 DIAGNOSIS — I110 Hypertensive heart disease with heart failure: Secondary | ICD-10-CM | POA: Diagnosis not present

## 2016-05-09 DIAGNOSIS — S12400A Unspecified displaced fracture of fifth cervical vertebra, initial encounter for closed fracture: Secondary | ICD-10-CM | POA: Diagnosis not present

## 2016-05-09 DIAGNOSIS — S0990XA Unspecified injury of head, initial encounter: Secondary | ICD-10-CM | POA: Diagnosis not present

## 2016-05-09 DIAGNOSIS — Y999 Unspecified external cause status: Secondary | ICD-10-CM | POA: Insufficient documentation

## 2016-05-09 DIAGNOSIS — S129XXA Fracture of neck, unspecified, initial encounter: Secondary | ICD-10-CM

## 2016-05-09 DIAGNOSIS — S199XXA Unspecified injury of neck, initial encounter: Secondary | ICD-10-CM | POA: Diagnosis present

## 2016-05-09 DIAGNOSIS — S12390A Other displaced fracture of fourth cervical vertebra, initial encounter for closed fracture: Secondary | ICD-10-CM | POA: Diagnosis not present

## 2016-05-09 DIAGNOSIS — S0993XA Unspecified injury of face, initial encounter: Secondary | ICD-10-CM | POA: Diagnosis not present

## 2016-05-09 LAB — I-STAT CHEM 8, ED
BUN: 15 mg/dL (ref 6–20)
CALCIUM ION: 1.14 mmol/L (ref 1.12–1.23)
Chloride: 100 mmol/L — ABNORMAL LOW (ref 101–111)
Creatinine, Ser: 0.6 mg/dL (ref 0.44–1.00)
Glucose, Bld: 88 mg/dL (ref 65–99)
HCT: 37 % (ref 36.0–46.0)
Hemoglobin: 12.6 g/dL (ref 12.0–15.0)
Potassium: 4.3 mmol/L (ref 3.5–5.1)
SODIUM: 140 mmol/L (ref 135–145)
TCO2: 29 mmol/L (ref 0–100)

## 2016-05-09 MED ORDER — SODIUM CHLORIDE 0.9 % IV BOLUS (SEPSIS)
500.0000 mL | Freq: Once | INTRAVENOUS | Status: AC
  Administered 2016-05-09: 500 mL via INTRAVENOUS

## 2016-05-09 MED ORDER — IOPAMIDOL (ISOVUE-370) INJECTION 76%: 50.0000 mL | Freq: Once | INTRAVENOUS | Status: DC | PRN

## 2016-05-09 NOTE — ED Notes (Signed)
CT called to get patient.

## 2016-05-09 NOTE — Discharge Instructions (Signed)
You were seen in the ED today after a fall at home. We found a small left sided neck fracture on the CT scans and have placed you in a collar. You should follow up with your Cardiologist and PCP regarding when/if to restart your Eliquis.  Vertebral Fracture A vertebral fracture means that one of the bones in the spine is broken (fractured). These bones are called vertebrae. You may have back pain that gets worse when you move. Vertebral fractures can be mild or severe. Many will get better without surgery. Surgery may be needed for more severe breaks. HOME CARE General Instructions  Take medicines only as told by your doctor.  Do not drive or use heavy machinery while taking pain medicine.  If told, put ice on the injured area:  Put ice in a plastic bag.  Place a towel between your skin and the bag.  Leave the ice on for 30 minutes every 2 hours at first. Then use the ice as needed.  Wear your neck brace or back brace as told by your doctor.  Do not drink alcohol.  Keep all follow-up visits as told by your doctor. This is important. Activity  Stay in bed (on bed rest) only as told by your doctor. Being on bed rest for too Avriana Joo can make your condition worse.  Return to your normal activities as told by your doctor. Ask your doctor what is safe for you to do.  Do exercises for your back (physical therapy) as told by your doctor.  Exercise often as told by your doctor. GET HELP IF:  You have a fever.  You have a cough that makes your pain worse.  Your pain medicine is not helping.  Your pain does not get better over time.  You cannot return to your normal activities as planned. GET HELP RIGHT AWAY IF:  Your pain is very bad and it suddenly gets worse.  You are not able to move any body part (paralysis) that is below the level of your injury.  You have numbness, tingling, or weakness in any body part that is below the level of your injury.  You cannot control when you  pee (urinate) or when you poop (have bowel movements).   This information is not intended to replace advice given to you by your health care provider. Make sure you discuss any questions you have with your health care provider.   Document Released: 03/27/2010 Document Revised: 02/21/2015 Document Reviewed: 10/12/2014 Elsevier Interactive Patient Education [employer].

## 2016-05-09 NOTE — ED Notes (Signed)
Spoke with Doctor regarding c-collar.

## 2016-05-09 NOTE — ED Notes (Signed)
Patient transported to CT 

## 2016-05-09 NOTE — ED Provider Notes (Signed)
Emergency Department Provider Note  Time seen: Approximately 7:13 AM  I have reviewed the triage vital signs and the nursing notes.   HISTORY  Chief Complaint Fall   HPI Christina Buckley is a 82 y.o. female with PMH of a-fib on Eliquis and chronic combined CHF returns to the emergency department for evaluation of mechanical fall yesterday at 5 PM. The patient was walking to her mailbox when she tripped over her feet and fell to the ground. She struck her face on the ground. Denies loss of consciousness. She had pain to the right frontal part of her head and around her right eye. No vision changes. She was able to get herself off the ground and walk inside. She applied ice to the area. She did not immediately seek medical attention. This morning she had ecchymosis around her right eye and forehead. Being on Eliquis she became concerned and presented to the emergency department. She has some mild shoulder soreness and left forearm swelling with minimal pain. She took Tylenol yesterday for pain which improved her symptoms significantly. She denies any vision changes or pain with looking around the room.   Past Medical History  Diagnosis Date  . GERD (gastroesophageal reflux disease)   . Scoliosis   . Moderate to Severe Mitral Regurgitation with Mitral Valve Prolapse   . Paroxysmal atrial fibrillation (HCC)     a. CHA2DS2VASc = 3-->eliquis 2.5 bid;  b. Amiodarone discontinued June 2016 secondary to nausea and vomiting.  Now rate controlled.   . Chronic combined systolic and diastolic CHF (congestive heart failure) ([place])     a. 03/2015 Echo: EF 40-45%, no rwma, Gr2 DD, mild AI, mod-sev MR, mildly dil LA, mod TR, PASP 74mmHg.    Patient Active Problem List   Diagnosis Date Noted  . Chronic combined systolic and diastolic heart failure ([place]) 01/19/2016  . Scoliosis 04/15/2015  . Paroxysmal atrial fibrillation ([place]) 03/31/2015  . Acute CHF ([place]) 03/31/2015  . Mitral regurgitation  03/31/2015  . Nausea and vomiting 03/31/2015  . Diuretic-induced hypokalemia 03/31/2015  . Mitral valve prolapse 02/22/2015  . Severe mitral insufficiency 02/22/2015  . Acute on chronic combined systolic and diastolic congestive heart failure ([place]) 02/22/2015  . Atrial fibrillation with RVR ([place]) 02/19/2015  . Hypotension 02/19/2015  . GERD (gastroesophageal reflux disease) 02/19/2015    History reviewed. No pertinent past surgical history.  Current Outpatient Rx  Name  Route  Sig  Dispense  Refill  . acetaminophen (TYLENOL) 500 MG tablet   Oral   Take 1,000 mg by mouth every 6 (six) hours as needed for headache.          . alendronate (FOSAMAX) 70 MG tablet   Oral   Take 1 tablet by mouth once a week.          Marland Kitchen apixaban (ELIQUIS) 2.5 MG TABS tablet   Oral   Take 1 tablet (2.5 mg total) by mouth every 12 (twelve) hours.   180 tablet   3   . Calcium Carb-Cholecalciferol (CALCIUM + D3) 600-200 MG-UNIT TABS   Oral   Take 1 tablet by mouth daily.         . carvedilol (COREG) 3.125 MG tablet      TAKE 1/2 TABLET BY MOUTH 2 TIMES DAILY WITH A MEAL   30 tablet   10   . furosemide (LASIX) 20 MG tablet   Oral   Take 0.5 tablets (10 mg total) by mouth daily.   45 tablet  3   . lisinopril (PRINIVIL,ZESTRIL) 2.5 MG tablet   Oral   Take 1 tablet (2.5 mg total) by mouth at bedtime.   90 tablet   3   . Multiple Vitamins-Minerals (MULTIVITAMIN WITH MINERALS) tablet   Oral   Take 1 tablet by mouth daily.           Allergies Amiodarone  Family History  Problem Relation Age of Onset  . Heart attack Father     in his 54s  . Diabetes Mother   . Heart attack Mother     lived to 68  . Heart attack Paternal Grandfather     Social History Social History  Substance Use Topics  . Smoking status: Never Smoker   . Smokeless tobacco: None  . Alcohol Use: None    Review of Systems  Constitutional: No fever/chills Eyes: No visual changes. ENT: No sore  throat. Cardiovascular: Denies chest pain. Respiratory: Denies shortness of breath. Gastrointestinal: No abdominal pain.  No nausea, no vomiting.  No diarrhea.  No constipation. Genitourinary: Negative for dysuria. Musculoskeletal: Negative for back pain. Skin: Negative for rash. Neurological: Negative for focal weakness or numbness. Positive HA and face pain.   10-point ROS otherwise negative.  ____________________________________________   PHYSICAL EXAM:  VITAL SIGNS: ED Triage Vitals  Enc Vitals Group     BP 05/09/16 0708 124/71 mmHg     Pulse Rate 05/09/16 0708 83     Resp 05/09/16 0708 18     Temp 05/09/16 0708 97.5 F (36.4 C)     Temp Source 05/09/16 0708 Oral     SpO2 05/09/16 0708 100 %     Pain Score 05/09/16 0709 1   Constitutional: Alert and oriented. Well appearing and in no acute distress. Eyes: Conjunctivae are normal. PERRL. EOMI. Ecchymosis around the right eye with mild swelling.  Head: Atraumatic. Nose: No congestion/rhinnorhea. No septal hematoma or other obvious injury.  Mouth/Throat: Mucous membranes are moist.  Oropharynx non-erythematous. Neck: No stridor. No cervical spine tenderness to palpation. Cardiovascular: Normal rate, regular rhythm. Good peripheral circulation. Grossly normal heart sounds.   Respiratory: Normal respiratory effort.  No retractions. Lungs CTAB. Gastrointestinal: Soft and nontender. No distention.  Musculoskeletal: No lower extremity tenderness nor edema. Small focal subcutaneous swelling over the right forearm. No skin laceration or abrasion.  Neurologic:  Normal speech and language. No gross focal neurologic deficits are appreciated.  Skin:  Skin is warm, dry and intact. No rash noted. Psychiatric: Mood and affect are normal. Speech and behavior are normal.  ____________________________________________   LABS (all labs ordered are listed, but only abnormal results are displayed)  Labs Reviewed  I-STAT CHEM 8, ED -  Abnormal; Notable for the following:    Chloride 100 (*)    All other components within normal limits   ____________________________________________  RADIOLOGY  Ct Head Wo Contrast  05/09/2016  CLINICAL DATA:  Pt fell in her driveway yesterday afternoon, face first in the dirt and gravel/ bruising to right side of face EXAM: CT HEAD WITHOUT CONTRAST CT MAXILLOFACIAL WITHOUT CONTRAST CT CERVICAL SPINE WITHOUT CONTRAST TECHNIQUE: Multidetector CT imaging of the head, cervical spine, and maxillofacial structures were performed using the standard protocol without intravenous contrast. Multiplanar CT image reconstructions of the cervical spine and maxillofacial structures were also generated. COMPARISON:  04/04/2015 FINDINGS: CT HEAD FINDINGS There is central and cortical atrophy. Periventricular white matter changes are consistent with small vessel disease. Bone windows demonstrate right frontal scalp hematoma without underlying calvarial fracture.  There is opacity of right ethmoid and right sphenoid sinuses similar in appearance to prior studies. No air-fluid levels within the paranasal sinuses. Mastoid air cells are normally aerated. Atherosclerotic calcification of the internal carotid arteries. CT MAXILLOFACIAL FINDINGS Orbits: Orbits and globes are intact. Mandible: Temporomandibular joints and mandible are intact. Nasal bones: Nasal bones and bony nasal septum are intact. Zygomatic arches: Intact. Skull base: Pterygoid plates and skullbase intact. Mastoid air cells are normally aerated. Sinuses: There is opacity within the right sphenoid air cell and posterior right ethmoid air cells not associated with fracture or air-fluid level. Findings are likely chronic. Visualized cervical spine: Degenerative changes without acute fracture Soft tissues: Right frontal scalp edema. CT CERVICAL SPINE FINDINGS There are fractures of C4 and C5. The fracture at C4 involves the anterior posterior arch of the left  transverse foramen. There is a similar about minimally displaced fracture of the left anterior posterior arch of the transverse foramen at C5. No other fractures are identified. There is exaggerated lordosis of the cervical spine, associated with degenerative changes. Chronic changes are identified in the lung apices. IMPRESSION: 1. Right frontal scalp edema not associated with calvarial fracture or acute intracranial abnormality. 2. Atrophy and small vessel disease. 3. No acute maxillofacial fracture. 4. Fractures at C4 and C5 involving the anterior and posterior arches of the left transverse foramina. Critical Value/emergent results were called by telephone at the time of interpretation on 05/09/2016 at 9:37 am to Dr. Nanda Quinton , who verbally acknowledged these results. Electronically Signed   By: Nolon Nations M.D.   On: 05/09/2016 09:39   Ct Angio Neck W Or Wo Contrast  05/09/2016  CLINICAL DATA:  82 year old female post fall with cervical spine fracture. Subsequent encounter. EXAM: CT ANGIOGRAPHY NECK TECHNIQUE: Multidetector CT imaging of the neck was performed using the standard protocol during bolus administration of intravenous contrast. Multiplanar CT image reconstructions and MIPs were obtained to evaluate the vascular anatomy. Carotid stenosis measurements (when applicable) are obtained utilizing NASCET criteria, using the distal internal carotid diameter as the denominator. CONTRAST:  50 cc Isovue 370 COMPARISON:  05/09/2016 cervical spine CT and head CT. FINDINGS: Aortic arch: Common origin innominate and left common carotid artery. Ectatic with mild plaque. Ascending thoracic aorta measures up to 3.3 cm. Right carotid system: No significant stenosis or regularity. Left carotid system: No significant stenosis. Dilated distal left internal carotid artery cervical segment with mild ectasia measuring up to 9.2 mm whereas proximal to this level left internal carotid artery measures up to 5.6 mm.  This is felt unlikely to be related to trauma. Vertebral arteries:Left vertebral artery is dominant. No injury of the vertebral arteries or occlusion detected. The irregularity of the vertebral arteries is consistent with changes of atherosclerosis. Skeleton: Fracture C4 and C5 as noted on recent cervical spine CT. Other neck: Scarring upper lung zones without worrisome mass. Opacification right sphenoid sinus with bony changes suggesting this represents chronic sinusitis. IMPRESSION: Fracture C4 and C5 as noted on recent cervical spine CT. No injury of the vertebral arteries. The irregularity of the vertebral arteries is consistent with changes of atherosclerosis. No injury of carotid arteries. Dilated distal left internal carotid artery cervical segment with ectasia measuring up to 9.2 mm whereas proximal to this level left internal carotid artery measures up to 5.6 mm. This is felt unlikely to be related to trauma and probably related to atherosclerotic changes. Right sphenoid sinus opacification consistent with chronic sinusitis. Aortic atherosclerosis. Electronically Signed   By:  Genia Del M.D.   On: 05/09/2016 11:51   Ct Cervical Spine Wo Contrast  05/09/2016  CLINICAL DATA:  Pt fell in her driveway yesterday afternoon, face first in the dirt and gravel/ bruising to right side of face EXAM: CT HEAD WITHOUT CONTRAST CT MAXILLOFACIAL WITHOUT CONTRAST CT CERVICAL SPINE WITHOUT CONTRAST TECHNIQUE: Multidetector CT imaging of the head, cervical spine, and maxillofacial structures were performed using the standard protocol without intravenous contrast. Multiplanar CT image reconstructions of the cervical spine and maxillofacial structures were also generated. COMPARISON:  04/04/2015 FINDINGS: CT HEAD FINDINGS There is central and cortical atrophy. Periventricular white matter changes are consistent with small vessel disease. Bone windows demonstrate right frontal scalp hematoma without underlying calvarial  fracture. There is opacity of right ethmoid and right sphenoid sinuses similar in appearance to prior studies. No air-fluid levels within the paranasal sinuses. Mastoid air cells are normally aerated. Atherosclerotic calcification of the internal carotid arteries. CT MAXILLOFACIAL FINDINGS Orbits: Orbits and globes are intact. Mandible: Temporomandibular joints and mandible are intact. Nasal bones: Nasal bones and bony nasal septum are intact. Zygomatic arches: Intact. Skull base: Pterygoid plates and skullbase intact. Mastoid air cells are normally aerated. Sinuses: There is opacity within the right sphenoid air cell and posterior right ethmoid air cells not associated with fracture or air-fluid level. Findings are likely chronic. Visualized cervical spine: Degenerative changes without acute fracture Soft tissues: Right frontal scalp edema. CT CERVICAL SPINE FINDINGS There are fractures of C4 and C5. The fracture at C4 involves the anterior posterior arch of the left transverse foramen. There is a similar about minimally displaced fracture of the left anterior posterior arch of the transverse foramen at C5. No other fractures are identified. There is exaggerated lordosis of the cervical spine, associated with degenerative changes. Chronic changes are identified in the lung apices. IMPRESSION: 1. Right frontal scalp edema not associated with calvarial fracture or acute intracranial abnormality. 2. Atrophy and small vessel disease. 3. No acute maxillofacial fracture. 4. Fractures at C4 and C5 involving the anterior and posterior arches of the left transverse foramina. Critical Value/emergent results were called by telephone at the time of interpretation on 05/09/2016 at 9:37 am to Dr. Nanda Quinton , who verbally acknowledged these results. Electronically Signed   By: Nolon Nations M.D.   On: 05/09/2016 09:39   Ct Maxillofacial Wo Cm  05/09/2016  CLINICAL DATA:  Pt fell in her driveway yesterday afternoon, face  first in the dirt and gravel/ bruising to right side of face EXAM: CT HEAD WITHOUT CONTRAST CT MAXILLOFACIAL WITHOUT CONTRAST CT CERVICAL SPINE WITHOUT CONTRAST TECHNIQUE: Multidetector CT imaging of the head, cervical spine, and maxillofacial structures were performed using the standard protocol without intravenous contrast. Multiplanar CT image reconstructions of the cervical spine and maxillofacial structures were also generated. COMPARISON:  04/04/2015 FINDINGS: CT HEAD FINDINGS There is central and cortical atrophy. Periventricular white matter changes are consistent with small vessel disease. Bone windows demonstrate right frontal scalp hematoma without underlying calvarial fracture. There is opacity of right ethmoid and right sphenoid sinuses similar in appearance to prior studies. No air-fluid levels within the paranasal sinuses. Mastoid air cells are normally aerated. Atherosclerotic calcification of the internal carotid arteries. CT MAXILLOFACIAL FINDINGS Orbits: Orbits and globes are intact. Mandible: Temporomandibular joints and mandible are intact. Nasal bones: Nasal bones and bony nasal septum are intact. Zygomatic arches: Intact. Skull base: Pterygoid plates and skullbase intact. Mastoid air cells are normally aerated. Sinuses: There is opacity within the right  sphenoid air cell and posterior right ethmoid air cells not associated with fracture or air-fluid level. Findings are likely chronic. Visualized cervical spine: Degenerative changes without acute fracture Soft tissues: Right frontal scalp edema. CT CERVICAL SPINE FINDINGS There are fractures of C4 and C5. The fracture at C4 involves the anterior posterior arch of the left transverse foramen. There is a similar about minimally displaced fracture of the left anterior posterior arch of the transverse foramen at C5. No other fractures are identified. There is exaggerated lordosis of the cervical spine, associated with degenerative changes. Chronic  changes are identified in the lung apices. IMPRESSION: 1. Right frontal scalp edema not associated with calvarial fracture or acute intracranial abnormality. 2. Atrophy and small vessel disease. 3. No acute maxillofacial fracture. 4. Fractures at C4 and C5 involving the anterior and posterior arches of the left transverse foramina. Critical Value/emergent results were called by telephone at the time of interpretation on 05/09/2016 at 9:37 am to Dr. Nanda Quinton , who verbally acknowledged these results. Electronically Signed   By: Nolon Nations M.D.   On: 05/09/2016 09:39    ____________________________________________   PROCEDURES  Procedure(s) performed:   Procedures  None ____________________________________________   INITIAL IMPRESSION / ASSESSMENT AND PLAN / ED COURSE  Pertinent labs & imaging results that were available during my care of the patient were reviewed by me and considered in my medical decision making (see chart for details).  Patient is a pleasant 82 year old female with past history of A. fib and combined congestive heart failure currently on Eliquis. She had a mechanical fall yesterday at 5 PM. She has had interval development of significant ecchymosis over the right side of her face. She has mild swelling over the right forearm with full range of motion of all joints and no tenderness to palpation of the swollen area. Patient is sure it was a mechanical fall and recalls tripping over her feet. No presyncopal symptoms. She is in her normal state of health. Pain is minimal and controlled with Tylenol at home. Given the time from injury and the patient's normal neurological exam and overall reassuring clinical presentation I have low suspicion for serious intracranial bleed. We will obtain CT scan of the head, face, cervical spine for complete evaluation. No clear indication for blood work or other testing at this time.  09:36 AM Spoke with radiology regarding CT c-spine.  Patient with C4/5 transverse process fracture. No Neuro deficits. Ordered CT angio for better characterization of arterial injury. Not unstable spine fracture. Updated patient. Will obtain STAT labs for kidney function and pre-hydrate with IVF.   12:30 PM Spoke with Dr. Ronnald Ramp with Neurosurgery. He recommends Aspen collar for the next month and outpatient follow up with him in 1 month. Discussed with the patient and family who are pleased. They will call their Cardiologist to discuss continuing the Eliquis. Discussed return precautions and plan for follow up in detail. All questions answered.  ____________________________________________  FINAL CLINICAL IMPRESSION(S) / ED DIAGNOSES  Final diagnoses:  Neck fracture, initial encounter    MEDICATIONS GIVEN DURING THIS VISIT:  Medications  sodium chloride 0.9 % bolus 500 mL (0 mLs Intravenous Stopped 05/09/16 1244)    NEW OUTPATIENT MEDICATIONS STARTED DURING THIS VISIT:  None   Note:  This document was prepared using Dragon voice recognition software and may include unintentional dictation errors.  Nanda Quinton, MD Emergency Medicine   Margette Fast, MD 05/09/16 [phone]

## 2016-05-09 NOTE — Telephone Encounter (Signed)
Reviewed with Dr. Sallyanne Kuster and pt should stop Eliquis. See Dr. Sallyanne Kuster in September as planned.  I spoke with pt and gave her instructions to stop Eliquis. Appt made for her to see Dr. Sallyanne Kuster on September 7,2017 at 2:15

## 2016-05-09 NOTE — Telephone Encounter (Signed)
Pt is a Dr. Sallyanne Kuster pt. Will forward to NL triage.

## 2016-05-09 NOTE — ED Notes (Signed)
CT results discussed with MD; no collar to be placed at this time.

## 2016-05-09 NOTE — Telephone Encounter (Signed)
Risk of serious bleeding from falls seems to outweigh the benefit. Stop Eliquis.

## 2016-05-09 NOTE — ED Notes (Signed)
Pt fell head tripped going out to get mail onto dirt road. On eliquis. Occurred 05/08/16 at 5pm. Did not pass out. Was able to get up and went inside and put ice on her face. Sore in R arm and chest. Goose egg on right forehead and black eye to R eye. Denies vision changes. Performs ADLs.

## 2016-05-09 NOTE — Telephone Encounter (Signed)
Medication list reviewed and pt takes Eliquis 2.5 mg by mouth twice daily. ED note reviewed and pt is to contact cardiology to discuss continuing Eliquis.  Will review with Dr. Sallyanne Kuster

## 2016-05-09 NOTE — Telephone Encounter (Signed)
New message      Pt c/o medication issue:  1. Name of Medication: Eliquis  2. How are you currently taking this medication (dosage and times per day)? 2.2 mg po twice daily  3. Are you having a reaction (difficulty breathing--STAT)? no  4. What is your medication issue? The pt fell and fractured her neck the pt wondering if she needs to still take her blood thinner   The pt fell and now has 2 fractures in her C-spine

## 2016-05-10 ENCOUNTER — Telehealth: Payer: Self-pay | Admitting: Cardiovascular Disease

## 2016-05-10 MED ORDER — IOPAMIDOL (ISOVUE-370) INJECTION 76%
50.0000 mL | Freq: Once | INTRAVENOUS | Status: AC | PRN
  Administered 2016-05-09: 50 mL via INTRAVENOUS

## 2016-05-10 NOTE — Telephone Encounter (Signed)
Left message on Kelly's identified voicemail to call back

## 2016-05-10 NOTE — Telephone Encounter (Signed)
I spoke with Claiborne Billings at [facility].  This is pt's primary care provider. I told her Dr. Sallyanne Kuster has given instructions for pt to stop Eliquis due to recent falls.

## 2016-05-10 NOTE — Telephone Encounter (Signed)
New message    Christina Buckley From [facility] requested to speak with RN. Pt states she was to stop take Eliquis. Christina Buckley would like a call back to confirm what pt is stating. Christina Buckley states pt continues to have falls; pt has a couple of fractures. Please call back to discuss

## 2016-05-11 IMAGING — CR DG CHEST 1V PORT
1 series · 1 of 1 positions shown · non-contrast
Comparison: 02/19/2015

CLINICAL DATA: Dyspnea.  Hypoxia.

EXAM:
PORTABLE CHEST - 1 VIEW

[AP]
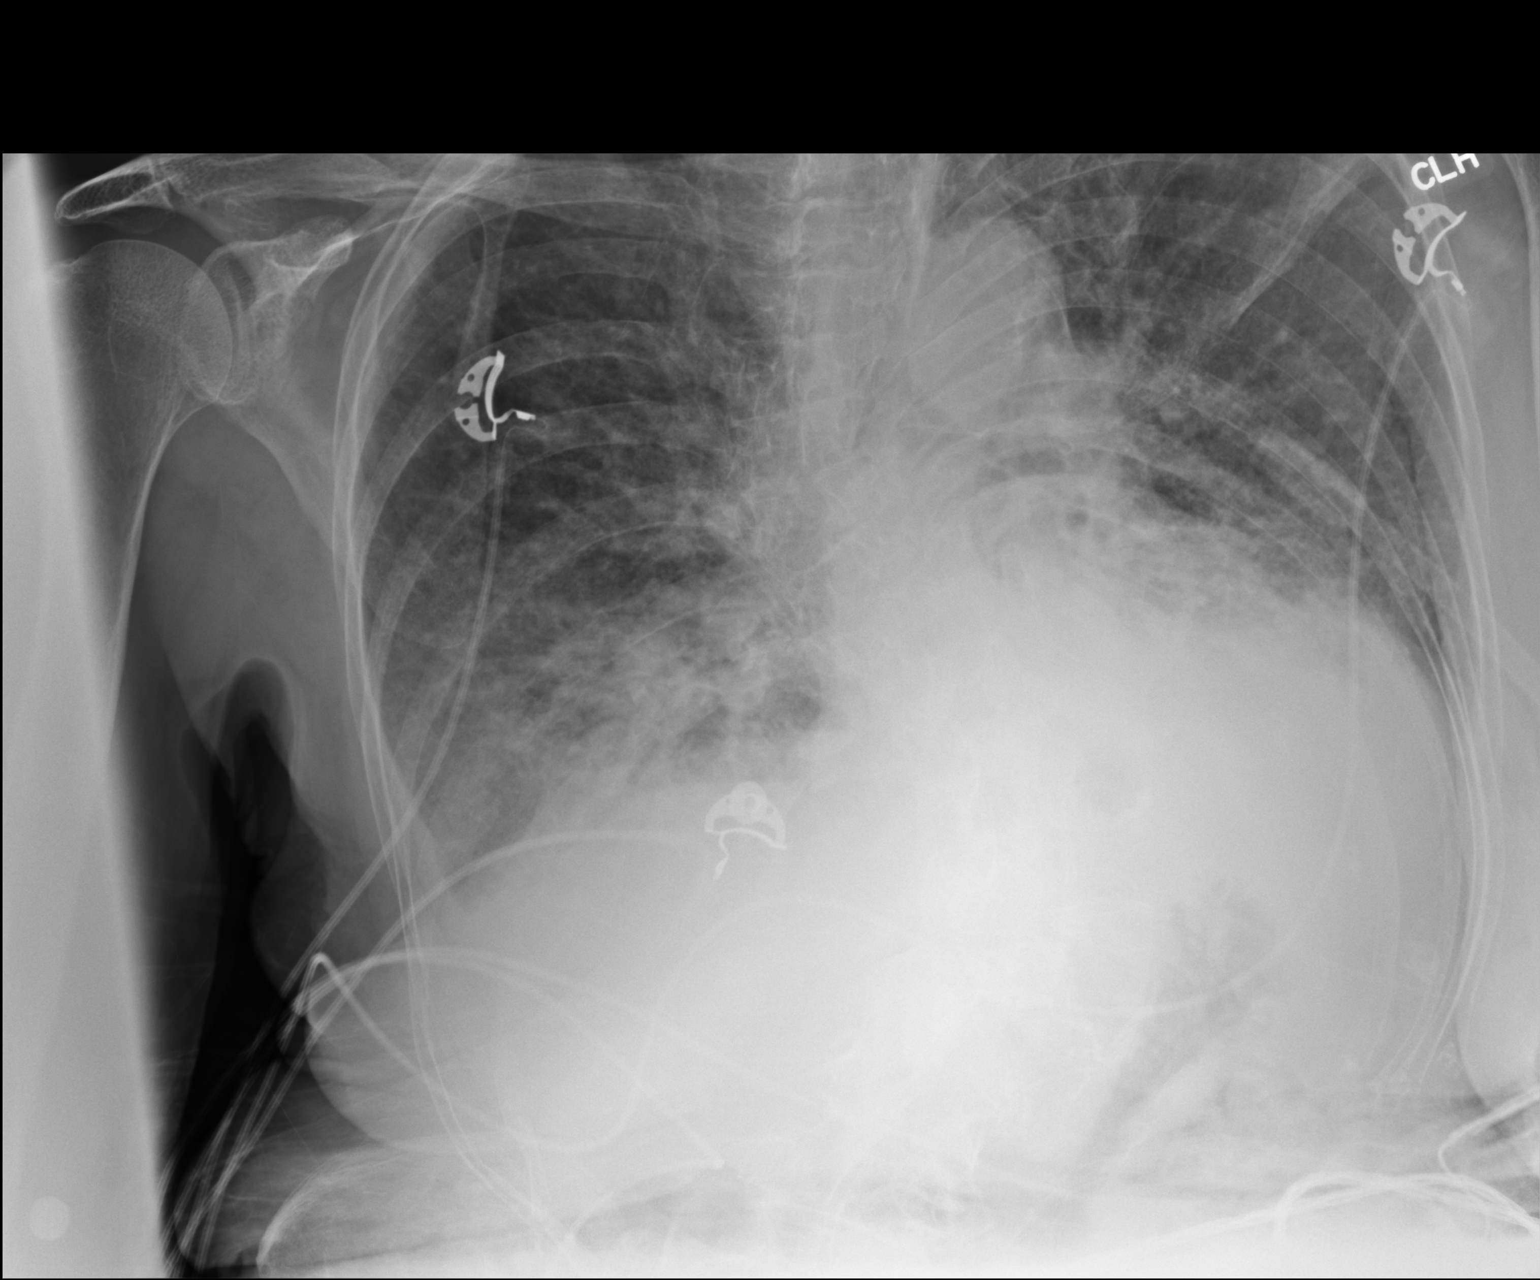

[1 of 1 positions shown; findings below may reference images not displayed]

FINDINGS: Vascular and interstitial congestive changes have worsened. There
are developing central and medial basilar airspace opacities which
may represent alveolar edema. Infectious infiltrates cannot be
entirely excluded. There are pleural effusions. The findings likely
represent worsening congestive heart failure.
IMPRESSION: Worsening congestive heart failure with developing airspace
opacities and pleural effusions.

## 2016-05-13 DIAGNOSIS — Z79899 Other long term (current) drug therapy: Secondary | ICD-10-CM | POA: Diagnosis not present

## 2016-05-13 DIAGNOSIS — Z681 Body mass index (BMI) 19 or less, adult: Secondary | ICD-10-CM | POA: Diagnosis not present

## 2016-05-13 DIAGNOSIS — S129XXA Fracture of neck, unspecified, initial encounter: Secondary | ICD-10-CM | POA: Diagnosis not present

## 2016-05-13 DIAGNOSIS — S0003XA Contusion of scalp, initial encounter: Secondary | ICD-10-CM | POA: Diagnosis not present

## 2016-05-13 DIAGNOSIS — S0083XA Contusion of other part of head, initial encounter: Secondary | ICD-10-CM | POA: Diagnosis not present

## 2016-06-10 DIAGNOSIS — S12301A Unspecified nondisplaced fracture of fourth cervical vertebra, initial encounter for closed fracture: Secondary | ICD-10-CM | POA: Diagnosis not present

## 2016-06-27 ENCOUNTER — Ambulatory Visit (INDEPENDENT_AMBULATORY_CARE_PROVIDER_SITE_OTHER): Payer: Medicare Other | Admitting: Cardiovascular Disease

## 2016-06-27 VITALS — BP 100/78 | HR 72 | Ht 60.0 in | Wt 96.6 lb

## 2016-06-27 DIAGNOSIS — I5042 Chronic combined systolic (congestive) and diastolic (congestive) heart failure: Secondary | ICD-10-CM | POA: Diagnosis not present

## 2016-06-27 DIAGNOSIS — E46 Unspecified protein-calorie malnutrition: Secondary | ICD-10-CM

## 2016-06-27 DIAGNOSIS — I4891 Unspecified atrial fibrillation: Secondary | ICD-10-CM

## 2016-06-27 DIAGNOSIS — I340 Nonrheumatic mitral (valve) insufficiency: Secondary | ICD-10-CM

## 2016-06-27 DIAGNOSIS — W19XXXA Unspecified fall, initial encounter: Secondary | ICD-10-CM

## 2016-06-27 NOTE — Patient Instructions (Addendum)
START Aspirin 81 mg daily   Dr Sallyanne Kuster recommends that you schedule a follow-up appointment in 1 year. You will receive a reminder letter in the mail two months in advance. If you don't receive a letter, please call our office to schedule the follow-up appointment.  If you need a refill on your cardiac medications before your next appointment, please call your pharmacy.

## 2016-06-27 NOTE — Progress Notes (Signed)
Patient ID: Christina Buckley, female   DOB: 04/17/1924, 82 y.o.   MRN: [id number]    Cardiology Office Note    Date:  06/29/2016   ID:  Christina Buckley, DOB 07/21/1924, MRN [id number]  PCP:  Leonides Sake, MD  Cardiologist:   Sanda Klein, MD   Chief Complaint  Patient presents with  . Follow-up    History of Present Illness:  Christina Buckley is a 82 y.o. female with severe mitral insufficiency due to mitral valve prolapse, paroxysmal atrial fibrillation and secondary heart failure.  She is maintaining New York Heart Association functional class II pattern of exertional dyspnea. She has occasional palpitations that are always very brief. She does not have leg edema and denies angina dizziness, syncope, orthopnea or PND. She had a fall and had a large bruise over her left cheek. She did not lose consciousness before or after the injury. She appears frail and unsteady, but she continues to live independently.  In 2015 she had a serious acute illness, likely due to acute worsening of mitral insufficiency from acute chordal rupture. She had a lot of problems with arrhythmia and heart to control heart failure. Things have gradually improved, likely as she has developed gradual left ventricular enlargement to compensate for the acute mitral insufficiency.  Low blood pressure and small body habitus have limited the use of heart failure medications. She did not tolerate amiodarone due to intractable nausea and vomiting  May 2016 Echo - Left ventricle: The cavity size was normal. There was focal basal  hypertrophy. Systolic function was mildly to moderately reduced.  The estimated ejection fraction was in the range of 40% to 45%.  Wall motion was normal; there were no regional wall motion  abnormalities. Features are consistent with a pseudonormal left  ventricular filling pattern, with concomitant abnormal relaxation  and increased filling pressure (grade 2 diastolic dysfunction). -  Aortic valve: There was mild regurgitation. - Mitral valve: Prolapse. There was moderate to severe  regurgitation. - Left atrium: The atrium was mildly dilated. - Tricuspid valve: There was moderate regurgitation. - Pulmonary arteries: Systolic pressure was moderately increased.  PA peak pressure: 48 mm Hg (S).  Past Medical History:  Diagnosis Date  . Chronic combined systolic and diastolic CHF (congestive heart failure) ([place])    a. 03/2015 Echo: EF 40-45%, no rwma, Gr2 DD, mild AI, mod-sev MR, mildly dil LA, mod TR, PASP 60mmHg.  Marland Kitchen GERD (gastroesophageal reflux disease)   . Moderate to Severe Mitral Regurgitation with Mitral Valve Prolapse   . Paroxysmal atrial fibrillation (HCC)    a. CHA2DS2VASc = 3-->eliquis 2.5 bid;  b. Amiodarone discontinued June 2016 secondary to nausea and vomiting.  Now rate controlled.   . Scoliosis     No past surgical history on file.  Current Medications: Outpatient Medications Prior to Visit  Medication Sig Dispense Refill  . acetaminophen (TYLENOL) 500 MG tablet Take 1,000 mg by mouth every 6 (six) hours as needed for headache.     . alendronate (FOSAMAX) 70 MG tablet Take 1 tablet by mouth once a week.     . Calcium Carb-Cholecalciferol (CALCIUM + D3) 600-200 MG-UNIT TABS Take 1 tablet by mouth daily.    . carvedilol (COREG) 3.125 MG tablet TAKE 1/2 TABLET BY MOUTH 2 TIMES DAILY WITH A MEAL 30 tablet 10  . furosemide (LASIX) 20 MG tablet Take 0.5 tablets (10 mg total) by mouth daily. 45 tablet 3  . lisinopril (PRINIVIL,ZESTRIL) 2.5 MG tablet Take 1  tablet (2.5 mg total) by mouth at bedtime. 90 tablet 3  . Multiple Vitamins-Minerals (MULTIVITAMIN WITH MINERALS) tablet Take 1 tablet by mouth daily.     No facility-administered medications prior to visit.      Allergies:   Amiodarone   Social History   Social History  . Marital status: Married    Spouse name: N/A  . Number of children: N/A  . Years of education: N/A   Occupational History    . 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 Smoking status: Never Smoker  . Smokeless tobacco: Not on file  . Alcohol use Not on file  . Drug use: Unknown  . Sexual activity: Not on file   Other Topics Concern  . Not on file   Social History Narrative   Lives at home alone, husband passed away last year.  Has multiple children nearby who look in on her daily.     Family History:  The patient's family history includes Diabetes in her mother; Heart attack in her father, mother, and paternal grandfather.   ROS:   Please see the history of present illness.    ROS All other systems reviewed and are negative.   PHYSICAL EXAM:   VS:  BP 100/78 (BP Location: Right Arm, Patient Position: Sitting, Cuff Size: Normal)   Pulse 72   Ht 5' (1.524 m)   Wt 96 lb 9.6 oz (43.8 kg)   BMI 18.87 kg/m    GEN: Well nourished, well developed, in no acute distress  HEENT: normal  Neck: no JVD, carotid bruits, or masses Cardiac: RRR; 3/6 holosystolic murmur heard throughout the entire precordium and radiating to was the axilla no diastolic murmurs, rubs, or gallops,no edema  Respiratory:  clear to auscultation bilaterally, normal work of breathing GI: soft, nontender, nondistended, + BS MS: no deformity or atrophy  Skin: warm and dry, no rash Neuro:  Alert and Oriented x 3, Strength and sensation are intact Psych: euthymic mood, full affect  Wt Readings from Last 3 Encounters:  06/27/16 96 lb 9.6 oz (43.8 kg)  01/17/16 97 lb (44 kg)  07/20/15 94 lb 12.8 oz (43 kg)      Studies/Labs Reviewed:   EKG:  EKG is ordered today.  The ekg ordered today demonstrates Sinus rhythm, Several PACs and one wide complex beats that is probably a PVC, only voltage criteria for LVH with repolarization abnormalities  Recent Labs: 05/09/2016: BUN 15; Creatinine, Ser 0.60; Hemoglobin 12.6; Potassium 4.3; Sodium 140   Lipid Panel No results found for: CHOL, TRIG, HDL,  CHOLHDL, VLDL, LDLCALC, LDLDIRECT    ASSESSMENT:    1. Chronic combined systolic and diastolic heart failure ([place])   2. Severe mitral insufficiency   3. Atrial fibrillation with RVR ([place])   4. Malnutrition ([place])   5. Fall, initial encounter      PLAN:  In order of problems listed above:  1. CHF: NYHA functional class II clinically euvolemic. Her blood pressure precludes further increase in carvedilol or lisinopril 2. MR: her age and overall frailty make poor surgical candidate. She is real not interested in any interventions, even if they do not involve conventional open-heart surgery (mitral valve clip discussed). 3. PAF: She has fairly frequent palpitations but seems to tolerate the arrhythmia without near syncope or worsening dyspnea. Poorly tolerant of amiodarone. I don't think she would do well with other antiarrhythmic medications. Continue low-dose beta blocker. 4.  She is markedly underweight and her low body mass puts her at high risk of complications with the anticoagulation.  5. Fall: I'm worried about her age and frailty and recent fall. I think she should discontinue the anticoagulant and take aspirin for stroke prevention.    Medication Adjustments/Labs and Tests Ordered: Current medicines are reviewed at length with the patient today.  Concerns regarding medicines are outlined above.  Medication changes, Labs and Tests ordered today are listed in the Patient Instructions below. Patient Instructions  START Aspirin 81 mg daily   Dr Sallyanne Kuster recommends that you schedule a follow-up appointment in 1 year. You will receive a reminder letter in the mail two months in advance. If you don't receive a letter, please call our office to schedule the follow-up appointment.  If you need a refill on your cardiac medications before your next appointment, please call your pharmacy.    Signed, Sanda Klein, MD  06/29/2016 4:53 PM    [facility] [address] [place], [place], [place]  [address] Phone: [phone]; Fax: [phone]

## 2016-06-28 ENCOUNTER — Telehealth: Payer: Self-pay | Admitting: Cardiovascular Disease

## 2016-06-28 DIAGNOSIS — Z1389 Encounter for screening for other disorder: Secondary | ICD-10-CM | POA: Diagnosis not present

## 2016-06-28 DIAGNOSIS — I5043 Acute on chronic combined systolic (congestive) and diastolic (congestive) heart failure: Secondary | ICD-10-CM | POA: Diagnosis not present

## 2016-06-28 DIAGNOSIS — Z681 Body mass index (BMI) 19 or less, adult: Secondary | ICD-10-CM | POA: Diagnosis not present

## 2016-06-28 DIAGNOSIS — E7800 Pure hypercholesterolemia, unspecified: Secondary | ICD-10-CM | POA: Diagnosis not present

## 2016-06-28 DIAGNOSIS — Z79899 Other long term (current) drug therapy: Secondary | ICD-10-CM | POA: Diagnosis not present

## 2016-06-28 DIAGNOSIS — I4891 Unspecified atrial fibrillation: Secondary | ICD-10-CM | POA: Diagnosis not present

## 2016-06-28 DIAGNOSIS — M810 Age-related osteoporosis without current pathological fracture: Secondary | ICD-10-CM | POA: Diagnosis not present

## 2016-06-28 DIAGNOSIS — Z9181 History of falling: Secondary | ICD-10-CM | POA: Diagnosis not present

## 2016-06-28 NOTE — Telephone Encounter (Signed)
New Message  Pt verbalized she doesn't need her oxygen anymore and wanted someone to put an order in for it to be discontinued.  Pt verbalized the oxygen is with [facility].  Please follow up.

## 2016-06-28 NOTE — Telephone Encounter (Signed)
Spoke to patient. She notes she has not used her home O2 in over a year - thinks Dr. Sallyanne Kuster initially prescribed about 2 years ago. She gets her supply deliveries from [place]. Has equipment and tanks at home which she does not use.  Notes she forgot to mention this at appt yesterday.   I spoke w/ Dr. Sallyanne Kuster - who gave verbal OK to discontinue. [facility] will need a faxed order to d/c.  I have phone number for equipment dept but no fax number.  [phone] ext [phone]

## 2016-06-29 ENCOUNTER — Encounter: Payer: Self-pay | Admitting: Cardiovascular Disease

## 2016-07-03 DIAGNOSIS — Z23 Encounter for immunization: Secondary | ICD-10-CM | POA: Diagnosis not present

## 2016-07-10 ENCOUNTER — Other Ambulatory Visit: Payer: Self-pay | Admitting: Cardiovascular Disease

## 2016-07-19 NOTE — Telephone Encounter (Signed)
Faxed encounter to [facility].

## 2016-09-11 ENCOUNTER — Other Ambulatory Visit: Payer: Self-pay | Admitting: Cardiovascular Disease

## 2016-12-06 ENCOUNTER — Other Ambulatory Visit: Payer: Self-pay | Admitting: Cardiovascular Disease

## 2016-12-06 NOTE — Telephone Encounter (Signed)
REFILL 

## 2016-12-26 DIAGNOSIS — E7800 Pure hypercholesterolemia, unspecified: Secondary | ICD-10-CM | POA: Diagnosis not present

## 2016-12-26 DIAGNOSIS — Z681 Body mass index (BMI) 19 or less, adult: Secondary | ICD-10-CM | POA: Diagnosis not present

## 2016-12-26 DIAGNOSIS — M810 Age-related osteoporosis without current pathological fracture: Secondary | ICD-10-CM | POA: Diagnosis not present

## 2016-12-26 DIAGNOSIS — I5043 Acute on chronic combined systolic (congestive) and diastolic (congestive) heart failure: Secondary | ICD-10-CM | POA: Diagnosis not present

## 2016-12-26 DIAGNOSIS — I4891 Unspecified atrial fibrillation: Secondary | ICD-10-CM | POA: Diagnosis not present

## 2017-07-07 DIAGNOSIS — I5043 Acute on chronic combined systolic (congestive) and diastolic (congestive) heart failure: Secondary | ICD-10-CM | POA: Diagnosis not present

## 2017-07-07 DIAGNOSIS — M4120 Other idiopathic scoliosis, site unspecified: Secondary | ICD-10-CM | POA: Diagnosis not present

## 2017-07-07 DIAGNOSIS — I340 Nonrheumatic mitral (valve) insufficiency: Secondary | ICD-10-CM | POA: Diagnosis not present

## 2017-07-07 DIAGNOSIS — I4891 Unspecified atrial fibrillation: Secondary | ICD-10-CM | POA: Diagnosis not present

## 2017-07-30 ENCOUNTER — Other Ambulatory Visit: Payer: Self-pay | Admitting: Cardiovascular Disease

## 2017-08-02 ENCOUNTER — Other Ambulatory Visit: Payer: Self-pay | Admitting: Cardiovascular Disease

## 2017-08-04 DIAGNOSIS — Z23 Encounter for immunization: Secondary | ICD-10-CM | POA: Diagnosis not present

## 2017-09-02 ENCOUNTER — Ambulatory Visit (INDEPENDENT_AMBULATORY_CARE_PROVIDER_SITE_OTHER): Payer: Medicare Other | Admitting: Cardiovascular Disease

## 2017-09-02 ENCOUNTER — Encounter: Payer: Self-pay | Admitting: Cardiovascular Disease

## 2017-09-02 VITALS — BP 136/82 | HR 72 | Ht 59.0 in | Wt 97.2 lb

## 2017-09-02 DIAGNOSIS — I480 Paroxysmal atrial fibrillation: Secondary | ICD-10-CM | POA: Diagnosis not present

## 2017-09-02 DIAGNOSIS — E440 Moderate protein-calorie malnutrition: Secondary | ICD-10-CM | POA: Diagnosis not present

## 2017-09-02 DIAGNOSIS — I5042 Chronic combined systolic (congestive) and diastolic (congestive) heart failure: Secondary | ICD-10-CM | POA: Diagnosis not present

## 2017-09-02 DIAGNOSIS — I340 Nonrheumatic mitral (valve) insufficiency: Secondary | ICD-10-CM | POA: Diagnosis not present

## 2017-09-02 MED ORDER — LOSARTAN POTASSIUM 25 MG PO TABS: 12.5000 mg | ORAL_TABLET | Freq: Every day | ORAL | 3 refills | Status: DC

## 2017-09-02 MED ORDER — CARVEDILOL 3.125 MG PO TABS: 3.1250 mg | ORAL_TABLET | Freq: Two times a day (BID) | ORAL | 3 refills | Status: DC

## 2017-09-02 NOTE — Progress Notes (Signed)
Patient ID: Christina Buckley, female   DOB: 12-31-1923, 83 y.o.   MRN: [id number]    Cardiology Office Note    Date:  09/03/2017   ID:  Christina Buckley, DOB [birth year], MRN [id number]  PCP:  Isaias Sakai, DO  Cardiologist:   Sanda Klein, MD   No chief complaint on file.   History of Present Illness:  Christina Buckley is a 83 y.o. female with severe mitral insufficiency due to mitral valve prolapse, paroxysmal atrial fibrillation and secondary heart failure.  She is doing remarkably well  Continues to have no more than functional class II exertional dyspnea.  She is relatively sedentary but takes care of some of her housework by herself.  She has palpitations less than once a week.  She is in sinus rhythm today.  She denies problems with leg edema.  She was woken from sleep with an episode of retrosternal hurting and nausea that lasted for about 30 minutes about a week ago.  She does not have retrosternal discomfort during activities of daily living.  She denies edema, orthopnea, PND, dizziness, syncope and she has not had any new falls.  She remains underweight with a BMI of 19 but continues to live independently with her children living right across the road.  In 2015 she had a serious acute illness, likely due to acute worsening of mitral insufficiency from acute chordal rupture. She had a lot of problems with arrhythmia and difficult to control heart failure. Things have gradually improved, likely as she has developed gradual left ventricular enlargement to compensate for the acute mitral insufficiency.  Low blood pressure and small body habitus have limited the use of heart failure medications. She did not tolerate amiodarone due to intractable nausea and vomiting  May 2016 Echo - Left ventricle: The cavity size was normal. There was focal basal  hypertrophy. Systolic function was mildly to moderately reduced.  The estimated ejection fraction was in the range of 40% to  45%.  Wall motion was normal; there were no regional wall motion  abnormalities. Features are consistent with a pseudonormal left  ventricular filling pattern, with concomitant abnormal relaxation  and increased filling pressure (grade 2 diastolic dysfunction). - Aortic valve: There was mild regurgitation. - Mitral valve: Prolapse. There was moderate to severe  regurgitation. - Left atrium: The atrium was mildly dilated. - Tricuspid valve: There was moderate regurgitation. - Pulmonary arteries: Systolic pressure was moderately increased.  PA peak pressure: 48 mm Hg (S).  Past Medical History:  Diagnosis Date  . Chronic combined systolic and diastolic CHF (congestive heart failure) ([place])    a. 03/2015 Echo: EF 40-45%, no rwma, Gr2 DD, mild AI, mod-sev MR, mildly dil LA, mod TR, PASP 64mmHg.  Marland Kitchen GERD (gastroesophageal reflux disease)   . Moderate to Severe Mitral Regurgitation with Mitral Valve Prolapse   . Paroxysmal atrial fibrillation (HCC)    a. CHA2DS2VASc = 3-->eliquis 2.5 bid;  b. Amiodarone discontinued June 2016 secondary to nausea and vomiting.  Now rate controlled.   . Scoliosis     No past surgical history on file.  Current Medications: Outpatient Medications Prior to Visit  Medication Sig Dispense Refill  . acetaminophen (TYLENOL) 500 MG tablet Take 1,000 mg by mouth every 6 (six) hours as needed for headache.     . alendronate (FOSAMAX) 70 MG tablet Take 1 tablet by mouth once a week.     Marland Kitchen aspirin EC 81 MG tablet Take 81 mg by mouth daily.    Marland Kitchen  Calcium Carb-Cholecalciferol (CALCIUM + D3) 600-200 MG-UNIT TABS Take 1 tablet by mouth daily.    . furosemide (LASIX) 20 MG tablet TAKE 1/2 TABLET BY MOUTH DAILY. 45 tablet 3  . Multiple Vitamins-Minerals (MULTIVITAMIN WITH MINERALS) tablet Take 1 tablet by mouth daily.    . carvedilol (COREG) 3.125 MG tablet TAKE 1/2 TABLET BY MOUTH 2 TIMES DAILY WITH A MEAL 30 tablet 11  . lisinopril (PRINIVIL,ZESTRIL) 2.5 MG tablet TAKE  1 TABLET (2.5 MG TOTAL) BY MOUTH AT BEDTIME. 90 tablet 3   No facility-administered medications prior to visit.      Allergies:   Amiodarone   Social History   Socioeconomic History  . Marital status: Married    Spouse name: None  . Number of children: None  . Years of education: None  . Highest education level: None  Social Needs  . Financial resource strain: None  . Food insecurity - worry: None  . Food insecurity - inability: None  . Transportation needs - medical: None  . Transportation needs - non-medical: None  Occupational History  . Occupation: Optician, dispensing: [employer]    Comment: worked in Copy  Tobacco Use  . Smoking status: Never Smoker  . Smokeless tobacco: Never Used  Substance and Sexual Activity  . Alcohol use: None  . Drug use: None  . Sexual activity: None  Other Topics Concern  . None  Social History Narrative   Lives at home alone, husband passed away last year.  Has multiple children nearby who look in on her daily.     Family History:  The patient's family history includes Diabetes in her mother; Heart attack in her father, mother, and paternal grandfather.   ROS:   Please see the history of present illness.    ROS All other systems reviewed and are negative.   PHYSICAL EXAM:   VS:  BP 136/82   Pulse 72   Ht 4\' 11"  (1.499 m)   Wt 97 lb 3.2 oz (44.1 kg)   BMI 19.63 kg/m    GEN: Well nourished, well developed, in no acute distress  HEENT: normal  Neck: no JVD, carotid bruits, or masses Cardiac: RRR; 3/6 holosystolic murmur heard throughout the entire precordium and radiating to was the axilla no diastolic murmurs, rubs, or gallops,no edema  Respiratory:  clear to auscultation bilaterally, normal work of breathing GI: soft, nontender, nondistended, + BS MS: no deformity or atrophy  Skin: warm and dry, no rash Neuro:  Alert and Oriented x 3, Strength and sensation are intact Psych: euthymic mood, full affect  Wt  Readings from Last 3 Encounters:  09/02/17 97 lb 3.2 oz (44.1 kg)  06/27/16 96 lb 9.6 oz (43.8 kg)  01/17/16 97 lb (44 kg)      Studies/Labs Reviewed:   EKG:  EKG is ordered today.  It shows sinus rhythm with first-degree AV block, no repolarization of normalities  ASSESSMENT:    1. Chronic combined systolic and diastolic heart failure (HCC)   2. Paroxysmal atrial fibrillation (HCC)      PLAN:  In order of problems listed above:  1. CHF: Mild symptoms, functional class II and clinically euvolemic.  Her blood pressure is a little higher today:  We will try to increase the dose of carvedilol slightly.  Per daughter, she has constant coughing, which she wonders could be caused by the lisinopril.  We will switch to losartan and look for opportunities to increase the dose of  the ARB in the future. 2. MR: Her small body habitus, frailty and advanced age make her a poor surgical candidate.  She did not even want to discuss nonsurgical treatment such as mitral clip.  She prefers conservative management. 3. PAF: Her palpitations seem to be occurring with decreasing frequency and are brief and not very symptomatic.  She did not tolerate amiodarone.  We will continue with beta-blocker therapy alone. She remains underweight.  Due to her age, history of falls and general frailty she is a poor candidate for anticoagulation.  The risk of bleeding seems to exceed the risk of stroke. 4. Underweight: weight quite stable over the last year.     Medication Adjustments/Labs and Tests Ordered: Current medicines are reviewed at length with the patient today.  Concerns regarding medicines are outlined above.  Medication changes, Labs and Tests ordered today are listed in the Patient Instructions below. Patient Instructions  Dr Sallyanne Kuster has recommended making the following medication changes: 1. STOP Lisinopril 2. START Losartan 25 mg - take 0.5 tablet (12.5 mg total) by mouth daily 3. INCREASE Carvedilol  to 3.125 mg twice daily  Your physician recommends that you schedule a follow-up appointment in 6 months. You will receive a reminder letter in the mail two months in advance. If you don't receive a letter, please call our office to schedule the follow-up appointment.  If you need a refill on your cardiac medications before your next appointment, please call your pharmacy.    Signed, Sanda Klein, MD  09/03/2017 3:00 PM    [address], [place], [place]  [address] Phone: [phone]; Fax: [phone]

## 2017-09-02 NOTE — Patient Instructions (Signed)
Dr Sallyanne Kuster has recommended making the following medication changes: 1. STOP Lisinopril 2. START Losartan 25 mg - take 0.5 tablet (12.5 mg total) by mouth daily 3. INCREASE Carvedilol to 3.125 mg twice daily  Your physician recommends that you schedule a follow-up appointment in 6 months. You will receive a reminder letter in the mail two months in advance. If you don't receive a letter, please call our office to schedule the follow-up appointment.  If you need a refill on your cardiac medications before your next appointment, please call your pharmacy.

## 2017-09-03 ENCOUNTER — Encounter: Payer: Self-pay | Admitting: Cardiovascular Disease

## 2017-09-25 ENCOUNTER — Other Ambulatory Visit: Payer: Self-pay

## 2017-10-16 DIAGNOSIS — E785 Hyperlipidemia, unspecified: Secondary | ICD-10-CM | POA: Diagnosis not present

## 2017-10-16 DIAGNOSIS — Z23 Encounter for immunization: Secondary | ICD-10-CM | POA: Diagnosis not present

## 2017-10-16 DIAGNOSIS — Z0000 Encounter for general adult medical examination without abnormal findings: Secondary | ICD-10-CM | POA: Diagnosis not present

## 2017-10-16 DIAGNOSIS — Z136 Encounter for screening for cardiovascular disorders: Secondary | ICD-10-CM | POA: Diagnosis not present

## 2018-01-05 DIAGNOSIS — M810 Age-related osteoporosis without current pathological fracture: Secondary | ICD-10-CM | POA: Diagnosis not present

## 2018-01-05 DIAGNOSIS — I340 Nonrheumatic mitral (valve) insufficiency: Secondary | ICD-10-CM | POA: Diagnosis not present

## 2018-01-05 DIAGNOSIS — I5042 Chronic combined systolic (congestive) and diastolic (congestive) heart failure: Secondary | ICD-10-CM | POA: Diagnosis not present

## 2018-01-05 DIAGNOSIS — I4891 Unspecified atrial fibrillation: Secondary | ICD-10-CM | POA: Diagnosis not present

## 2018-01-05 DIAGNOSIS — Z681 Body mass index (BMI) 19 or less, adult: Secondary | ICD-10-CM | POA: Diagnosis not present

## 2018-01-05 DIAGNOSIS — Z1331 Encounter for screening for depression: Secondary | ICD-10-CM | POA: Diagnosis not present

## 2018-07-08 DIAGNOSIS — I5042 Chronic combined systolic (congestive) and diastolic (congestive) heart failure: Secondary | ICD-10-CM | POA: Diagnosis not present

## 2018-07-08 DIAGNOSIS — I340 Nonrheumatic mitral (valve) insufficiency: Secondary | ICD-10-CM | POA: Diagnosis not present

## 2018-07-08 DIAGNOSIS — Z23 Encounter for immunization: Secondary | ICD-10-CM | POA: Diagnosis not present

## 2018-07-08 DIAGNOSIS — M810 Age-related osteoporosis without current pathological fracture: Secondary | ICD-10-CM | POA: Diagnosis not present

## 2018-07-08 DIAGNOSIS — I4891 Unspecified atrial fibrillation: Secondary | ICD-10-CM | POA: Diagnosis not present

## 2018-07-08 DIAGNOSIS — Z1339 Encounter for screening examination for other mental health and behavioral disorders: Secondary | ICD-10-CM | POA: Diagnosis not present

## 2018-08-14 ENCOUNTER — Other Ambulatory Visit: Payer: Self-pay | Admitting: Cardiovascular Disease

## 2018-08-15 ENCOUNTER — Other Ambulatory Visit: Payer: Self-pay | Admitting: Cardiovascular Disease

## 2018-08-17 NOTE — Telephone Encounter (Signed)
Rx request sent to pharmacy.  

## 2018-11-10 ENCOUNTER — Other Ambulatory Visit: Payer: Self-pay | Admitting: Cardiovascular Disease

## 2018-11-27 ENCOUNTER — Encounter: Payer: Self-pay | Admitting: Cardiovascular Disease

## 2018-11-27 ENCOUNTER — Encounter (INDEPENDENT_AMBULATORY_CARE_PROVIDER_SITE_OTHER): Payer: Self-pay

## 2018-11-27 ENCOUNTER — Ambulatory Visit (INDEPENDENT_AMBULATORY_CARE_PROVIDER_SITE_OTHER): Payer: Medicare Other | Admitting: Cardiovascular Disease

## 2018-11-27 VITALS — BP 124/78 | HR 85 | Ht 59.0 in | Wt 97.4 lb

## 2018-11-27 DIAGNOSIS — I5042 Chronic combined systolic (congestive) and diastolic (congestive) heart failure: Secondary | ICD-10-CM

## 2018-11-27 DIAGNOSIS — I480 Paroxysmal atrial fibrillation: Secondary | ICD-10-CM | POA: Diagnosis not present

## 2018-11-27 DIAGNOSIS — I340 Nonrheumatic mitral (valve) insufficiency: Secondary | ICD-10-CM | POA: Diagnosis not present

## 2018-11-27 DIAGNOSIS — R636 Underweight: Secondary | ICD-10-CM | POA: Diagnosis not present

## 2018-11-27 MED ORDER — FUROSEMIDE 20 MG PO TABS: 10.0000 mg | ORAL_TABLET | Freq: Every day | ORAL | 3 refills | Status: AC

## 2018-11-27 MED ORDER — LOSARTAN POTASSIUM 25 MG PO TABS: 12.5000 mg | ORAL_TABLET | Freq: Every day | ORAL | 11 refills | Status: AC

## 2018-11-27 MED ORDER — CARVEDILOL 3.125 MG PO TABS: 3.1250 mg | ORAL_TABLET | Freq: Two times a day (BID) | ORAL | 11 refills | Status: AC

## 2018-11-27 NOTE — Patient Instructions (Signed)
Medication Instructions:  Your physician recommends that you continue on your current medications as directed. Please refer to the Current Medication list given to you today.  STOP Aspirin  If you need a refill on your cardiac medications before your next appointment, please call your pharmacy.   Follow-Up: At [facility], you and your health needs are our priority.  As part of our continuing mission to provide you with exceptional heart care, we have created designated Provider Care Teams.  These Care Teams include your primary Cardiologist (physician) and Advanced Practice Providers (APPs -  Physician Assistants and Nurse Practitioners) who all work together to provide you with the care you need, when you need it. You will need a follow up appointment in 1 years.  Please call our office 2 months in advance to schedule this appointment.  You may see Sanda Klein, MD or one of the following Advanced Practice Providers on your designated Care Team: [place], [place] . Fabian Sharp, PA-C  Any Other Special Instructions Will Be Listed Below (If Applicable). None

## 2018-11-27 NOTE — Progress Notes (Signed)
Patient ID: Christina Buckley, female   DOB: [birth year], 85 y.o.   MRN: [id number]    Cardiology Office Note    Date:  11/29/2018   ID:  Christina Buckley, DOB 1924-01-02, MRN [id number]  PCP:  Isaias Sakai, DO  Cardiologist:   Sanda Klein, MD   No chief complaint on file.   History of Present Illness:  Christina Buckley is a 85 y.o. female with severe mitral insufficiency due to mitral valve prolapse, paroxysmal atrial fibrillation and secondary heart failure.  She is doing remarkably well.  She continues to live independently with help from her family.  The patient specifically denies any chest pain at rest exertion, dyspnea at rest or with exertion, orthopnea, paroxysmal nocturnal dyspnea, syncope, focal neurological deficits, intermittent claudication, lower extremity edema, unexplained weight gain, cough, hemoptysis or wheezing.  She will have occasional palpitation sustained for a few hours during which she feels a little weaker than usual and takes it easy.  She had a cough on lisinopril but is tolerating losartan fine.  She is maintaining sinus rhythm most of the time it appears.  She remains mildly underweight with a BMI just under 20..   In 2015 she had a serious acute illness, likely due to acute worsening of mitral insufficiency from acute chordal rupture. She had a lot of problems with arrhythmia and difficult to control heart failure. Things have gradually improved, likely as she has developed gradual left ventricular enlargement to compensate for the acute mitral insufficiency.  Low blood pressure and small body habitus have limited the use of heart failure medications. She did not tolerate amiodarone due to intractable nausea and vomiting  May 2016 Echo - Left ventricle: The cavity size was normal. There was focal basal  hypertrophy. Systolic function was mildly to moderately reduced.  The estimated ejection fraction was in the range of 40% to 45%.  Wall  motion was normal; there were no regional wall motion  abnormalities. Features are consistent with a pseudonormal left  ventricular filling pattern, with concomitant abnormal relaxation  and increased filling pressure (grade 2 diastolic dysfunction). - Aortic valve: There was mild regurgitation. - Mitral valve: Prolapse. There was moderate to severe  regurgitation. - Left atrium: The atrium was mildly dilated. - Tricuspid valve: There was moderate regurgitation. - Pulmonary arteries: Systolic pressure was moderately increased.  PA peak pressure: 48 mm Hg (S).  Past Medical History:  Diagnosis Date  . Chronic combined systolic and diastolic CHF (congestive heart failure) ([place])    a. 03/2015 Echo: EF 40-45%, no rwma, Gr2 DD, mild AI, mod-sev MR, mildly dil LA, mod TR, PASP 25mmHg.  Marland Kitchen GERD (gastroesophageal reflux disease)   . Moderate to Severe Mitral Regurgitation with Mitral Valve Prolapse   . Paroxysmal atrial fibrillation (HCC)    a. CHA2DS2VASc = 3-->eliquis 2.5 bid;  b. Amiodarone discontinued June 2016 secondary to nausea and vomiting.  Now rate controlled.   . Scoliosis     No past surgical history on file.  Current Medications: Outpatient Medications Prior to Visit  Medication Sig Dispense Refill  . acetaminophen (TYLENOL) 500 MG tablet Take 1,000 mg by mouth every 6 (six) hours as needed for headache.     . Calcium Carb-Cholecalciferol (CALCIUM + D3) 600-200 MG-UNIT TABS Take 1 tablet by mouth daily.    . Multiple Vitamins-Minerals (MULTIVITAMIN WITH MINERALS) tablet Take 1 tablet by mouth daily.    Marland Kitchen aspirin EC 81 MG tablet Take 81 mg by mouth daily.    Marland Kitchen  carvedilol (COREG) 3.125 MG tablet TAKE 1 TABLET BY MOUTH 2 (TWO) TIMES DAILY WITH A MEAL. NEED OV. 60 tablet 0  . furosemide (LASIX) 20 MG tablet TAKE 1/2 TABLET BY MOUTH DAILY. 45 tablet 3  . losartan (COZAAR) 25 MG tablet TAKE 0.5 TABLETS BY MOUTH DAILY. NEED OV. 15 tablet 0  . alendronate (FOSAMAX) 70 MG tablet  Take 1 tablet by mouth once a week.      No facility-administered medications prior to visit.      Allergies:   Amiodarone   Social History   Socioeconomic History  . Marital status: Married    Spouse name: Not on file  . Number of children: Not on file  . Years of education: Not on file  . Highest education level: Not on file  Occupational History  . Occupation: Optician, dispensing: [employer]    Comment: worked in Cytogeneticist  . Financial resource strain: Not on file  . Food insecurity:    Worry: Not on file    Inability: Not on file  . Transportation needs:    Medical: Not on file    Non-medical: Not on file  Tobacco Use  . Smoking status: Never Smoker  . Smokeless tobacco: Never Used  Substance and Sexual Activity  . Alcohol use: Not on file  . Drug use: Not on file  . Sexual activity: Not on file  Lifestyle  . Physical activity:    Days per week: Not on file    Minutes per session: Not on file  . Stress: Not on file  Relationships  . Social connections:    Talks on phone: Not on file    Gets together: Not on file    Attends religious service: Not on file    Active member of club or organization: Not on file    Attends meetings of clubs or organizations: Not on file    Relationship status: Not on file  Other Topics Concern  . Not on file  Social History Narrative   Lives at home alone, husband passed away last year.  Has multiple children nearby who look in on her daily.     Family History:  The patient's family history includes Diabetes in her mother; Heart attack in her father, mother, and paternal grandfather.   ROS:   Please see the history of present illness.    ROS All other systems reviewed and are negative.   PHYSICAL EXAM:   VS:  BP 124/78   Pulse 85   Ht 4\' 11"  (1.499 m)   Wt 97 lb 6.4 oz (44.2 kg)   BMI 19.67 kg/m    GEN: Well nourished, well developed, in no acute distress  HEENT: normal  Neck: no JVD, carotid  bruits, or masses Cardiac: RRR; 3/6 holosystolic murmur heard throughout the entire precordium and radiating to was the axilla no diastolic murmurs, rubs, or gallops,no edema  Respiratory:  clear to auscultation bilaterally, normal work of breathing GI: soft, nontender, nondistended, + BS MS: no deformity or atrophy  Skin: warm and dry, no rash Neuro:  Alert and Oriented x 3, Strength and sensation are intact Psych: euthymic mood, full affect  Wt Readings from Last 3 Encounters:  11/27/18 97 lb 6.4 oz (44.2 kg)  09/02/17 97 lb 3.2 oz (44.1 kg)  06/27/16 96 lb 9.6 oz (43.8 kg)      Studies/Labs Reviewed:   EKG:  EKG is ordered today.  It  shows sinus rhythm with first-degree AV block, voltage criteria for LVH, no repolarization changes, QTC 422 ms ASSESSMENT:    1. Chronic combined systolic and diastolic congestive heart failure ([place])   2. Nonrheumatic mitral (valve) insufficiency   3. Paroxysmal atrial fibrillation (HCC)   4. Underweight      PLAN:  In order of problems listed above:  1. CHF: Remarkably well compensated on current dose of loop diuretic and on ARB (cough has resolved from ACE inhibitor to losartan 2. MR: Her small body habitus, frailty and advanced age make her a poor surgical candidate.  If her symptoms warranted, I think she would be a reasonably good candidate for mitraclip. 3. PAF:  I suspect that this explains her occasional palpitations and temporary decrease in functional status.  She did not tolerate amiodarone.  We will continue with beta-blocker therapy alone. She remains underweight.  Due to her age, history of falls and general frailty she is a poor candidate for anticoagulation.  I remain of the opinion that the risk of stroke is less than the risk of bleeding complications, especially since she has severe mitral regurgitation which might lessen the likelihood of clot formation in the appendage. 4. Underweight: Weight is stable.     Medication  Adjustments/Labs and Tests Ordered: Current medicines are reviewed at length with the patient today.  Concerns regarding medicines are outlined above.  Medication changes, Labs and Tests ordered today are listed in the Patient Instructions below. Patient Instructions  Medication Instructions:  Your physician recommends that you continue on your current medications as directed. Please refer to the Current Medication list given to you today.  STOP Aspirin  If you need a refill on your cardiac medications before your next appointment, please call your pharmacy.   Follow-Up: At [facility], you and your health needs are our priority.  As part of our continuing mission to provide you with exceptional heart care, we have created designated Provider Care Teams.  These Care Teams include your primary Cardiologist (physician) and Advanced Practice Providers (APPs -  Physician Assistants and Nurse Practitioners) who all work together to provide you with the care you need, when you need it. You will need a follow up appointment in 1 years.  Please call our office 2 months in advance to schedule this appointment.  You may see Sanda Klein, MD or one of the following Advanced Practice Providers on your designated Care Team: [place], [place] . Fabian Sharp, PA-C  Any Other Special Instructions Will Be Listed Below (If Applicable). None      Signed, Sanda Klein, MD  11/29/2018 12:24 PM    [facility] [address], [place], [place]  [address] Phone: [phone]; Fax: [phone]

## 2018-11-29 ENCOUNTER — Encounter: Payer: Self-pay | Admitting: Cardiovascular Disease

## 2018-11-29 DIAGNOSIS — R636 Underweight: Secondary | ICD-10-CM | POA: Insufficient documentation

## 2019-05-03 ENCOUNTER — Emergency Department (HOSPITAL_COMMUNITY): Payer: Medicare Other

## 2019-05-03 ENCOUNTER — Inpatient Hospital Stay (HOSPITAL_COMMUNITY)
Admission: EM | Admit: 2019-05-03 | Discharge: 2019-05-06 | DRG: 690 | Disposition: A | Discharge status: Home Health | Payer: Medicare Other | Attending: Internal Medicine | Admitting: Internal Medicine

## 2019-05-03 ENCOUNTER — Other Ambulatory Visit: Payer: Self-pay

## 2019-05-03 ENCOUNTER — Encounter (HOSPITAL_COMMUNITY): Payer: Self-pay

## 2019-05-03 DIAGNOSIS — Z8249 Family history of ischemic heart disease and other diseases of the circulatory system: Secondary | ICD-10-CM

## 2019-05-03 DIAGNOSIS — D696 Thrombocytopenia, unspecified: Secondary | ICD-10-CM | POA: Diagnosis present

## 2019-05-03 DIAGNOSIS — I340 Nonrheumatic mitral (valve) insufficiency: Secondary | ICD-10-CM | POA: Diagnosis present

## 2019-05-03 DIAGNOSIS — N839 Noninflammatory disorder of ovary, fallopian tube and broad ligament, unspecified: Secondary | ICD-10-CM | POA: Diagnosis present

## 2019-05-03 DIAGNOSIS — R600 Localized edema: Secondary | ICD-10-CM | POA: Diagnosis present

## 2019-05-03 DIAGNOSIS — B9620 Unspecified Escherichia coli [E. coli] as the cause of diseases classified elsewhere: Secondary | ICD-10-CM | POA: Diagnosis present

## 2019-05-03 DIAGNOSIS — Z888 Allergy status to other drugs, medicaments and biological substances status: Secondary | ICD-10-CM

## 2019-05-03 DIAGNOSIS — R17 Unspecified jaundice: Secondary | ICD-10-CM | POA: Diagnosis present

## 2019-05-03 DIAGNOSIS — R008 Other abnormalities of heart beat: Secondary | ICD-10-CM | POA: Diagnosis not present

## 2019-05-03 DIAGNOSIS — I5042 Chronic combined systolic (congestive) and diastolic (congestive) heart failure: Secondary | ICD-10-CM | POA: Diagnosis present

## 2019-05-03 DIAGNOSIS — N83209 Unspecified ovarian cyst, unspecified side: Secondary | ICD-10-CM | POA: Diagnosis not present

## 2019-05-03 DIAGNOSIS — M419 Scoliosis, unspecified: Secondary | ICD-10-CM | POA: Diagnosis present

## 2019-05-03 DIAGNOSIS — I480 Paroxysmal atrial fibrillation: Secondary | ICD-10-CM | POA: Diagnosis not present

## 2019-05-03 DIAGNOSIS — N838 Other noninflammatory disorders of ovary, fallopian tube and broad ligament: Secondary | ICD-10-CM | POA: Diagnosis not present

## 2019-05-03 DIAGNOSIS — Z90711 Acquired absence of uterus with remaining cervical stump: Secondary | ICD-10-CM

## 2019-05-03 DIAGNOSIS — M549 Dorsalgia, unspecified: Secondary | ICD-10-CM | POA: Diagnosis present

## 2019-05-03 DIAGNOSIS — R609 Edema, unspecified: Secondary | ICD-10-CM | POA: Diagnosis not present

## 2019-05-03 DIAGNOSIS — Z79899 Other long term (current) drug therapy: Secondary | ICD-10-CM

## 2019-05-03 DIAGNOSIS — R1900 Intra-abdominal and pelvic swelling, mass and lump, unspecified site: Secondary | ICD-10-CM | POA: Diagnosis not present

## 2019-05-03 DIAGNOSIS — Z66 Do not resuscitate: Secondary | ICD-10-CM | POA: Diagnosis not present

## 2019-05-03 DIAGNOSIS — M1612 Unilateral primary osteoarthritis, left hip: Secondary | ICD-10-CM | POA: Diagnosis present

## 2019-05-03 DIAGNOSIS — M25552 Pain in left hip: Secondary | ICD-10-CM

## 2019-05-03 DIAGNOSIS — R52 Pain, unspecified: Secondary | ICD-10-CM | POA: Diagnosis not present

## 2019-05-03 DIAGNOSIS — Z1159 Encounter for screening for other viral diseases: Secondary | ICD-10-CM | POA: Diagnosis not present

## 2019-05-03 DIAGNOSIS — D649 Anemia, unspecified: Secondary | ICD-10-CM | POA: Diagnosis present

## 2019-05-03 DIAGNOSIS — Z03818 Encounter for observation for suspected exposure to other biological agents ruled out: Secondary | ICD-10-CM | POA: Diagnosis not present

## 2019-05-03 DIAGNOSIS — R0602 Shortness of breath: Secondary | ICD-10-CM | POA: Diagnosis not present

## 2019-05-03 DIAGNOSIS — K219 Gastro-esophageal reflux disease without esophagitis: Secondary | ICD-10-CM | POA: Diagnosis not present

## 2019-05-03 DIAGNOSIS — Z9181 History of falling: Secondary | ICD-10-CM

## 2019-05-03 DIAGNOSIS — E876 Hypokalemia: Secondary | ICD-10-CM | POA: Diagnosis present

## 2019-05-03 DIAGNOSIS — I110 Hypertensive heart disease with heart failure: Secondary | ICD-10-CM | POA: Diagnosis present

## 2019-05-03 DIAGNOSIS — N907 Vulvar cyst: Secondary | ICD-10-CM | POA: Diagnosis not present

## 2019-05-03 DIAGNOSIS — I081 Rheumatic disorders of both mitral and tricuspid valves: Secondary | ICD-10-CM | POA: Diagnosis present

## 2019-05-03 DIAGNOSIS — N390 Urinary tract infection, site not specified: Secondary | ICD-10-CM | POA: Diagnosis not present

## 2019-05-03 DIAGNOSIS — R5381 Other malaise: Secondary | ICD-10-CM | POA: Diagnosis not present

## 2019-05-03 DIAGNOSIS — N3000 Acute cystitis without hematuria: Secondary | ICD-10-CM | POA: Diagnosis not present

## 2019-05-03 LAB — URINALYSIS, ROUTINE W REFLEX MICROSCOPIC
Bilirubin Urine: NEGATIVE
Glucose, UA: NEGATIVE mg/dL
Ketones, ur: NEGATIVE mg/dL
Nitrite: POSITIVE — AB
Protein, ur: NEGATIVE mg/dL
Specific Gravity, Urine: 1.01 (ref 1.005–1.030)
pH: 7 (ref 5.0–8.0)

## 2019-05-03 LAB — CBC WITH DIFFERENTIAL/PLATELET
Abs Immature Granulocytes: 0.02 10*3/uL (ref 0.00–0.07)
Basophils Absolute: 0 10*3/uL (ref 0.0–0.1)
Basophils Relative: 0 %
Eosinophils Absolute: 0 10*3/uL (ref 0.0–0.5)
Eosinophils Relative: 0 %
HCT: 40.7 % (ref 36.0–46.0)
Hemoglobin: 13.6 g/dL (ref 12.0–15.0)
Immature Granulocytes: 0 %
Lymphocytes Relative: 11 %
Lymphs Abs: 0.8 10*3/uL (ref 0.7–4.0)
MCH: 32.6 pg (ref 26.0–34.0)
MCHC: 33.4 g/dL (ref 30.0–36.0)
MCV: 97.6 fL (ref 80.0–100.0)
Monocytes Absolute: 0.6 10*3/uL (ref 0.1–1.0)
Monocytes Relative: 8 %
Neutro Abs: 6.2 10*3/uL (ref 1.7–7.7)
Neutrophils Relative %: 81 %
Platelets: 155 10*3/uL (ref 150–400)
RBC: 4.17 MIL/uL (ref 3.87–5.11)
RDW: 12.8 % (ref 11.5–15.5)
WBC: 7.7 10*3/uL (ref 4.0–10.5)
nRBC: 0 % (ref 0.0–0.2)

## 2019-05-03 LAB — COMPREHENSIVE METABOLIC PANEL
ALT: 12 U/L (ref 0–44)
AST: 27 U/L (ref 15–41)
Albumin: 3.7 g/dL (ref 3.5–5.0)
Alkaline Phosphatase: 87 U/L (ref 38–126)
Anion gap: 11 (ref 5–15)
BUN: 17 mg/dL (ref 8–23)
CO2: 26 mmol/L (ref 22–32)
Calcium: 8.8 mg/dL — ABNORMAL LOW (ref 8.9–10.3)
Chloride: 101 mmol/L (ref 98–111)
Creatinine, Ser: 0.59 mg/dL (ref 0.44–1.00)
GFR calc Af Amer: >60 mL/min (ref >60)
GFR calc non Af Amer: >60 mL/min (ref >60)
Glucose, Bld: 81 mg/dL (ref 70–99)
Potassium: 3.8 mmol/L (ref 3.5–5.1)
Sodium: 138 mmol/L (ref 135–145)
Total Bilirubin: 2.3 mg/dL — ABNORMAL HIGH (ref 0.3–1.2)
Total Protein: 7.6 g/dL (ref 6.5–8.1)

## 2019-05-03 LAB — I-STAT CREATININE, ED: Creatinine, Ser: 0.5 mg/dL (ref 0.44–1.00)

## 2019-05-03 LAB — SARS CORONAVIRUS 2 BY RT PCR (HOSPITAL ORDER, PERFORMED IN ~~LOC~~ HOSPITAL LAB): SARS Coronavirus 2: NEGATIVE

## 2019-05-03 MED ORDER — GADOBUTROL 1 MMOL/ML IV SOLN
7.5000 mL | Freq: Once | INTRAVENOUS | Status: AC | PRN
  Administered 2019-05-03: 5 mL via INTRAVENOUS

## 2019-05-03 MED ORDER — SODIUM CHLORIDE 0.9 % IV SOLN
1.0000 g | Freq: Once | INTRAVENOUS | Status: AC
  Administered 2019-05-03: 22:00:00 1 g via INTRAVENOUS
  Filled 2019-05-03: qty 10

## 2019-05-03 NOTE — ED Notes (Signed)
XR at bedside

## 2019-05-03 NOTE — ED Provider Notes (Signed)
3:11 PM Care assumed from Dr. once.  At time of transfer care, patient is awaiting results of MRI of the hip to look for injury as patient reports onset of pain today and having difficult time with ambulation.  X-ray did not show fracture so MRI was recommended by radiology.  If MRI does not show acute injury, anticipate discharge home with a walker or crutches per previous team recommendation.  If imaging reveals injury, anticipate admission.  MRI returned showing no evidence of fracture dislocation or significant abnormality with her hip however there was evidence of a cystic mass in her left lower pelvis.  It is unclear what this mass is and the radiologist recommended MRI with and without contrast to further evaluate.  Had a shared decision made conversation with patient to determine disposition.  Patient agreed that if she was able to safely ambulate and bear weight on the hip, she would pursue further work-up of this abnormality as an outpatient however if she is unable to walk or take any steps as she lives alone currently, she would likely need to be admitted.  Patient try to ambulate and could not take a single step or bear significant weight on her left leg and hip without severe left hip area pain.  Thus, will initiate work-up to look for labs, urine, I will order the MRI to further characterize this abnormality, and will admit patient when work-up has been completed.  When patient is laying still, she reports she does not need pain medicine however she will likely need PT/OT to determine level of care needs and further management of this gait problem, as she is clearly a fall risk with the severe pain and inability to ambulate safely.  9:48 PM Radiologist called report that patient does indeed have bilateral cystic masses on her ovaries.  He is unclear if this is just chronic changes or a malignancy however he does not see any acute infection in them, abscess, bony change or metastasis or  fracture.  He suspect these can be worked up as an outpatient.  We did however find evidence of urinary tract infection.  She will be given Rocephin and admitted for her inability to ambulate or bear weight on her left leg as well as the UTI.  Patient will need PT/OT.  Clinical Impression: 1. Left hip pain   2. Bilateral tubo-ovarian mass     Disposition: Admit  This note was prepared with assistance of Dragon voice recognition software. Occasional wrong-word or sound-a-like substitutions may have occurred due to the inherent limitations of voice recognition software.       Hayward Rylander, Gwenyth Allegra, MD 05/04/19 [phone]

## 2019-05-03 NOTE — ED Notes (Signed)
Patient transported to MRI 

## 2019-05-03 NOTE — ED Notes (Signed)
Patient denies any metal in her body/pacemaker/cochlear implant/metal rods/screws. Patient denies dentures. Patient denies claustrophobia. 22g PIV placed to left forearm for contrast MRI. Patient updated on plan of care. Patient verbalizes understanding.

## 2019-05-03 NOTE — ED Provider Notes (Signed)
[facility] DEPT Provider Note   CSN: [id number] Arrival date & time: 05/03/19  0940    History   Chief Complaint Chief Complaint  Patient presents with  . Hip Pain    left    HPI Christina Buckley is a 85 y.o. female.     HPI   She presents for evaluation of atraumatic left hip pain which started yesterday and worsened today making it difficult to walk.  No fall.  She lives independently in her own home.  She denies fever, chills, cough, weakness or dizziness.  There are no other known modifying factors.  Past Medical History:  Diagnosis Date  . Chronic combined systolic and diastolic CHF (congestive heart failure) ([place])    a. 03/2015 Echo: EF 40-45%, no rwma, Gr2 DD, mild AI, mod-sev MR, mildly dil LA, mod TR, PASP 2mmHg.  Marland Kitchen GERD (gastroesophageal reflux disease)   . Moderate to Severe Mitral Regurgitation with Mitral Valve Prolapse   . Paroxysmal atrial fibrillation (HCC)    a. CHA2DS2VASc = 3-->eliquis 2.5 bid;  b. Amiodarone discontinued June 2016 secondary to nausea and vomiting.  Now rate controlled.   . Scoliosis     Patient Active Problem List   Diagnosis Date Noted  . Underweight 11/29/2018  . Chronic combined systolic and diastolic heart failure ([place]) 01/19/2016  . Scoliosis 04/15/2015  . Paroxysmal atrial fibrillation ([place]) 03/31/2015  . Acute CHF ([place]) 03/31/2015  . Nonrheumatic mitral (valve) insufficiency 03/31/2015  . Nausea and vomiting 03/31/2015  . Diuretic-induced hypokalemia 03/31/2015  . Mitral valve prolapse 02/22/2015  . Severe mitral insufficiency 02/22/2015  . Acute on chronic combined systolic and diastolic congestive heart failure ([place]) 02/22/2015  . Atrial fibrillation with RVR ([place]) 02/19/2015  . Hypotension 02/19/2015  . GERD (gastroesophageal reflux disease) 02/19/2015    History reviewed. No pertinent surgical history.   OB History   No obstetric history on file.      Home Medications     Prior to Admission medications   Medication Sig Start Date End Date Taking? Authorizing Provider  Calcium Carb-Cholecalciferol (CALCIUM + D3) 600-200 MG-UNIT TABS Take 1 tablet by mouth daily.   Yes [provider]  carvedilol (COREG) 3.125 MG tablet Take 1 tablet (3.125 mg total) by mouth 2 (two) times daily with a meal. 11/27/18  Yes Croitoru, Mihai, MD  furosemide (LASIX) 20 MG tablet Take 0.5 tablets (10 mg total) by mouth daily. 11/27/18  Yes Croitoru, Mihai, MD  losartan (COZAAR) 25 MG tablet Take 0.5 tablets (12.5 mg total) by mouth daily. 11/27/18  Yes Croitoru, Mihai, MD  Multiple Vitamins-Minerals (MULTIVITAMIN WITH MINERALS) tablet Take 1 tablet by mouth daily.   Yes [provider]  acetaminophen (TYLENOL) 500 MG tablet Take 1,000 mg by mouth every 6 (six) hours as needed for headache.     [provider]    Family History Family History  Problem Relation Age of Onset  . Heart attack Father        in his 67s  . Diabetes Mother   . Heart attack Mother        lived to 73  . Heart attack Paternal Grandfather     Social History Social History   Tobacco Use  . Smoking status: Never Smoker  . Smokeless tobacco: Never Used  Substance Use Topics  . Alcohol use: Not on file  . Drug use: Not on file     Allergies   Amiodarone   Review of Systems  Review of Systems  All other systems reviewed and are negative.    Physical Exam Updated Vital Signs BP 139/86   Pulse 80   Temp 97.8 F (36.6 C) (Oral)   Resp 18   Ht 5' (1.524 m)   Wt 43.1 kg   SpO2 99%   BMI 18.55 kg/m   Physical Exam Vitals signs and nursing note reviewed.  Constitutional:      General: She is not in acute distress.    Appearance: She is well-developed. She is not ill-appearing, toxic-appearing or diaphoretic.     Comments: Elderly, frail  HENT:     Head: Normocephalic and atraumatic.     Right Ear: External ear normal.     Left Ear: External ear normal.     Nose:  No rhinorrhea.  Eyes:     Conjunctiva/sclera: Conjunctivae normal.     Pupils: Pupils are equal, round, and reactive to light.  Neck:     Musculoskeletal: Normal range of motion and neck supple.     Trachea: Phonation normal.  Cardiovascular:     Rate and Rhythm: Normal rate and regular rhythm.     Heart sounds: Normal heart sounds.  Pulmonary:     Effort: Pulmonary effort is normal.     Breath sounds: Normal breath sounds.  Abdominal:     Palpations: Abdomen is soft.     Tenderness: There is no abdominal tenderness.  Musculoskeletal: Normal range of motion.        General: No swelling or tenderness.  Skin:    General: Skin is warm and dry.  Neurological:     Mental Status: She is alert and oriented to person, place, and time.     Cranial Nerves: No cranial nerve deficit.     Sensory: No sensory deficit.     Motor: No abnormal muscle tone.     Coordination: Coordination normal.  Psychiatric:        Mood and Affect: Mood normal.        Behavior: Behavior normal.        Thought Content: Thought content normal.        Judgment: Judgment normal.      ED Treatments / Results  Labs (all labs ordered are listed, but only abnormal results are displayed) Labs Reviewed - No data to display  EKG None  Radiology Dg Hip Unilat With Pelvis 2-3 Views Left  Result Date: 05/03/2019 CLINICAL DATA:  Left hip pain EXAM: DG HIP (WITH OR WITHOUT PELVIS) 2-3V LEFT COMPARISON:  None. FINDINGS: There is no evidence of hip fracture or dislocation. There is no evidence of arthropathy or other focal bone abnormality. There is generalized osteopenia. IMPRESSION: No acute osseous injury of the left hip. Given the patient's age and osteopenia, if there is persistent clinical concern for an occult hip fracture, a MRI of the hip is recommended for increased sensitivity. Electronically Signed   By: Kathreen Devoid   On: 05/03/2019 11:08    Procedures Procedures (including critical care time)   Medications Ordered in ED Medications - No data to display   Initial Impression / Assessment and Plan / ED Course  I have reviewed the triage vital signs and the nursing notes.  Pertinent labs & imaging results that were available during my care of the patient were reviewed by me and considered in my medical decision making (see chart for details).         Patient Vitals for the past 24 hrs:  BP  Temp Temp src Pulse Resp SpO2 Height Weight  05/03/19 1430 139/86 - - 80 - 99 % - -  05/03/19 1400 121/72 - - 67 18 99 % - -  05/03/19 1330 113/70 - - 71 - 100 % - -  05/03/19 1300 112/67 - - 65 - 99 % - -  05/03/19 1230 109/66 - - 69 - 100 % - -  05/03/19 1200 115/78 - - 69 - 98 % - -  05/03/19 1135 - - - 78 - 100 % - -  05/03/19 1133 120/71 - - - - - - -  05/03/19 1030 114/67 - - 77 - 100 % - -  05/03/19 1000 125/81 - - 74 - 100 % - -  05/03/19 0956 117/77 97.8 F (36.6 C) Oral 75 18 100 % 5' (1.524 m) 43.1 kg    3:20 PM-MRI pending to evaluate for occult left hip fracture.   Medical Decision Making: Atraumatic pain left hip, plain images without fracture.  MRI ordered to evaluate for occult left hip fracture.   CRITICAL CARE-no Performed by: Daleen Bo  Nursing Notes Reviewed/ Care Coordinated Applicable Imaging Reviewed Interpretation of Laboratory Data incorporated into ED treatment   Plan-as per oncoming provider team following return of MRI imaging.  Final Clinical Impressions(s) / ED Diagnoses   Final diagnoses:  None    ED Discharge Orders    None       Daleen Bo, MD 05/03/19 1531

## 2019-05-03 NOTE — ED Notes (Signed)
MRI scheduled for 2:30pm

## 2019-05-03 NOTE — ED Notes (Signed)
PT c/o pain while standing. Unable to walk, steady used to transfer PT to restroom

## 2019-05-03 NOTE — ED Notes (Signed)
Pt transported to MRI 

## 2019-05-03 NOTE — ED Triage Notes (Signed)
Patient BIB [facility] from home where she lives alone. AxOx4. Patient reports having left hip pain x3 days. Denies injury/falls. Patient does have edema to bilateral legs, which patient states is her normal. Patient reports pain last night has gotten so bad, she no longer can bear weight to left hip. No shortening/rotation. 3+ Equal bilateral pulses.   EMS VS: 128/80, 76HR, 97% RA, CBG=108.

## 2019-05-03 NOTE — H&P (Signed)
Christina Buckley MHD:[id number] DOB: [birth year] DOA: 05/03/2019     PCP: Isaias Sakai, DO   Outpatient Specialists:  CARDS:   Dr.Croitoru   Patient arrived to ER on 05/03/19 at [facility]  Patient coming from: home Lives alone,   Chief Complaint:  Chief Complaint  Patient presents with   Hip Pain    left    HPI: Christina Buckley is a 85 y.o. female with medical history significant of combined systolic diastolic CHF, paroxysmal atrial fibrillation, severe mitral insufficiency, hypertension, GERD    Presented with left hip pain started yesterday patient denies any trauma pain has gotten worse today and she cannot walk anymore she lives by herself at home no associated fevers or chills no cough no shortness of breath no chest pain  Patient at her baseline able to ambulate and does very well lives independently.  She have not had any recent syncope or dyspnea She gets occasional palpitations but her heart failure is under good control   Infectious risk factors:  Reports none In  ER RAPID COVID TEST NEGATIVE      Regarding pertinent Chronic problems:       HTN on on Cozaar, did not tolerate lisinopril developed a cough, Coreg   CHF diastolic/systolic combined - last echo 03/2015 EF 21-19%, 2 diastolic dysfunction severe MR moderate TR PA CVP 848 mmHg On Lasix      A. Fib -  - CHA2DS2 vas score 3: Not on anticoagulation secondary to risk of falls rate controlled with Coreg        - Rhythm control: Did not tolerate amiodarone,    While in ER: Plain imaging showed no evidence of fracture Had gone to have an MRI done in ER to evaluate for any fracture negative for fracture  But did show cystic mass in her left lower pelvis She patient attempted to ambulate with a walker but it hurt her too much she is unable to put weight on her left leg Repeat MRI of the pelvis was ordered showing bilateral cystic masses on her ovaries unsure if chronic versus malignancy no bony  abnormality In emergency department noted to have urinary tract infection started on Rocephin get evaluated The following Work up has been ordered so far:  Orders Placed This Encounter  Procedures   Urine culture   SARS Coronavirus 2 (CEPHEID - Performed in [place] hospital lab), Hosp Order   DG Hip Unilat With Pelvis 2-3 Views Left   MR HIP LEFT WO CONTRAST   MR PELVIS W WO CONTRAST   DG Chest 2 View   CBC with Differential   Comprehensive metabolic panel   Urinalysis, Routine w reflex microscopic   NPO Except for Meds   Ambulate in hall   Ambulate in hall   Consult to hospitalist  ALL PATIENTS BEING ADMITTED/HAVING PROCEDURES NEED COVID-19 SCREENING   I-Stat Creatinine, ED (not at [facility])   EKG 12-Lead   EKG 12-Lead   Place in observation (patient's expected length of stay will be less than 2 midnights)    Following Medications were ordered in ER: Medications  gadobutrol (GADAVIST) 1 MMOL/ML injection 7.5 mL (5 mLs Intravenous Contrast Given 05/03/19 2054)  cefTRIAXone (ROCEPHIN) 1 g in sodium chloride 0.9 % 100 mL IVPB (1 g Intravenous New Bag/Given 05/03/19 2202)        Consult Orders  (From admission, onward)         Start     Ordered   05/03/19  2149  Consult to hospitalist  ALL PATIENTS BEING ADMITTED/HAVING PROCEDURES NEED COVID-19 SCREENING  Once    Comments: ALL PATIENTS BEING ADMITTED/HAVING PROCEDURES NEED COVID-19 SCREENING  Provider:  (Not yet assigned)  Question Answer Comment  Place call to: [facility]   Reason for Consult Admit      05/03/19 2148          Significant initial  Findings: Abnormal Labs Reviewed  COMPREHENSIVE METABOLIC PANEL - Abnormal; Notable for the following components:      Result Value   Calcium 8.8 (*)    Total Bilirubin 2.3 (*)    All other components within normal limits  URINALYSIS, ROUTINE W REFLEX MICROSCOPIC - Abnormal; Notable for the following components:   APPearance HAZY (*)    Hgb urine  dipstick MODERATE (*)    Nitrite POSITIVE (*)    Leukocytes,Ua TRACE (*)    Bacteria, UA RARE (*)    All other components within normal limits     Otherwise labs showing:    Recent Labs  Lab 05/03/19 1917 05/03/19 2047  NA 138  --   K 3.8  --   CO2 26  --   GLUCOSE 81  --   BUN 17  --   CREATININE 0.59 0.50  CALCIUM 8.8*  --     Cr  stable,    Lab Results  Component Value Date   CREATININE 0.50 05/03/2019   CREATININE 0.59 05/03/2019   CREATININE 0.60 05/09/2016    Recent Labs  Lab 05/03/19 1917  AST 27  ALT 12  ALKPHOS 87  BILITOT 2.3*  PROT 7.6  ALBUMIN 3.7   Lab Results  Component Value Date   CALCIUM 8.8 (L) 05/03/2019      WBC      Component Value Date/Time   WBC 7.7 05/03/2019 1917   ANC    Component Value Date/Time   NEUTROABS 6.2 05/03/2019 1917   ALC No results found for: LYMPHOABS    Plt: Lab Results  Component Value Date   PLT 155 05/03/2019    Lactic Acid, Venous    Component Value Date/Time   LATICACIDVEN 1.84 02/19/2015 0900   COVID-19 Labs    Lab Results  Component Value Date   SARSCOV2NAA NEGATIVE 05/03/2019        HG/HCT  stable,      Component Value Date/Time   HGB 13.6 05/03/2019 1917   HCT 40.7 05/03/2019 1917        UA    evidence of UTI    Urine analysis:    Component Value Date/Time   COLORURINE YELLOW 05/03/2019 1917   APPEARANCEUR HAZY (A) 05/03/2019 1917   LABSPEC 1.010 05/03/2019 1917   PHURINE 7.0 05/03/2019 1917   GLUCOSEU NEGATIVE 05/03/2019 1917   HGBUR MODERATE (A) 05/03/2019 1917   BILIRUBINUR NEGATIVE 05/03/2019 1917   KETONESUR NEGATIVE 05/03/2019 1917   PROTEINUR NEGATIVE 05/03/2019 1917   UROBILINOGEN 1.0 03/31/2015 1530   NITRITE POSITIVE (A) 05/03/2019 1917   LEUKOCYTESUR TRACE (A) 05/03/2019 1917       CXR -  NON acute   MRI pelvis -bilateral masses measuring up to 9 cm cannot rule out malignancy  MRI left hip no evidence of fracture but possible pelvic mass   ECG:    Personally reviewed by me showing: HR : 88 Rhythm:  NSR   no evidence of ischemic changes QTC 492      ED Triage Vitals [05/03/19 0956]  Enc Vitals Group  BP 117/77     Pulse Rate 75     Resp 18     Temp 97.8 F (36.6 C)     Temp Source Oral     SpO2 100 %     Weight 95 lb (43.1 kg)     Height 5' (1.524 m)     Head Circumference      Peak Flow      Pain Score      Pain Loc      Pain Edu?      Excl. in [place]?   VHQI(69)@       Latest  Blood pressure (!) 109/56, pulse 86, temperature 97.8 F (36.6 C), temperature source Oral, resp. rate 16, height 5' (1.524 m), weight 43.1 kg, SpO2 95 %.     Hospitalist was called for admission for UTI and new diagnosis of pelvic mass   Review of Systems:    Pertinent positives include:  fatigue, right legg swelling and left hip pain  Constitutional:  No weight loss, night sweats, Fevers, chills,  weight loss  HEENT:  No headaches, Difficulty swallowing,Tooth/dental problems,Sore throat,  No sneezing, itching, ear ache, nasal congestion, post nasal drip,  Cardio-vascular:  No chest pain, Orthopnea, PND, anasarca, dizziness, palpitations.no Bilateral lower extremity swelling  GI:  No heartburn, indigestion, abdominal pain, nausea, vomiting, diarrhea, change in bowel habits, loss of appetite, melena, blood in stool, hematemesis Resp:  no shortness of breath at rest. No dyspnea on exertion, No excess mucus, no productive cough, No non-productive cough, No coughing up of blood.No change in color of mucus.No wheezing. Skin:  no rash or lesions. No jaundice GU:  no dysuria, change in color of urine, no urgency or frequency. No straining to urinate.  No flank pain.  Musculoskeletal:  No joint pain or no joint swelling. No decreased range of motion. No back pain.  Psych:  No change in mood or affect. No depression or anxiety. No memory loss.  Neuro: no localizing neurological complaints, no tingling, no weakness, no double vision,  no gait abnormality, no slurred speech, no confusion  All systems reviewed and apart from [place] all are negative  Past Medical History:   Past Medical History:  Diagnosis Date   Chronic combined systolic and diastolic CHF (congestive heart failure) ([place])    a. 03/2015 Echo: EF 40-45%, no rwma, Gr2 DD, mild AI, mod-sev MR, mildly dil LA, mod TR, PASP 22mmHg.   GERD (gastroesophageal reflux disease)    Moderate to Severe Mitral Regurgitation with Mitral Valve Prolapse    Paroxysmal atrial fibrillation ([facility])    a. CHA2DS2VASc = 3-->eliquis 2.5 bid;  b. Amiodarone discontinued June 2016 secondary to nausea and vomiting.  Now rate controlled.    Scoliosis       History reviewed. No pertinent surgical history.  Social History:  Ambulatory   Independently     reports that she has never smoked. She has never used smokeless tobacco. No history on file for alcohol and drug.     Family History:   Family History  Problem Relation Age of Onset   Heart attack Father        in his 67s   Diabetes Mother    Heart attack Mother        lived to [place] attack Paternal Grandfather     Allergies: Allergies  Allergen Reactions   Amiodarone Nausea And Vomiting     Prior to Admission medications   Medication Sig Start  Date End Date Taking? Authorizing Provider  Calcium Carb-Cholecalciferol (CALCIUM + D3) 600-200 MG-UNIT TABS Take 1 tablet by mouth daily.   Yes [provider]  carvedilol (COREG) 3.125 MG tablet Take 1 tablet (3.125 mg total) by mouth 2 (two) times daily with a meal. 11/27/18  Yes Croitoru, Mihai, MD  furosemide (LASIX) 20 MG tablet Take 0.5 tablets (10 mg total) by mouth daily. 11/27/18  Yes Croitoru, Mihai, MD  losartan (COZAAR) 25 MG tablet Take 0.5 tablets (12.5 mg total) by mouth daily. 11/27/18  Yes Croitoru, Mihai, MD  Multiple Vitamins-Minerals (MULTIVITAMIN WITH MINERALS) tablet Take 1 tablet by mouth daily.   Yes [provider]    acetaminophen (TYLENOL) 500 MG tablet Take 1,000 mg by mouth every 6 (six) hours as needed for headache.     [provider]   Physical Exam: Blood pressure (!) 109/56, pulse 86, temperature 97.8 F (36.6 C), temperature source Oral, resp. rate 16, height 5' (1.524 m), weight 43.1 kg, SpO2 95 %. 1. General:  in No  Acute distress   Chronically ill  -appearing 2. Psychological: Alert and Oriented 3. Head/ENT:     Dry Mucous Membranes                          Head Non traumatic, neck supple                           Poor Dentition 4. SKIN:  decreased Skin turgor,  Skin clean Dry and intact no rash 5. Heart: Regular rate and rhythm systolic Murmur, no Rub or gallop 6. Lungs: Clear to auscultation bilaterally, no wheezes or crackles   7. Abdomen: Soft,  non-tender, Non distended   bowel sounds present suprapubic and left lower quadrant mass palpable 8. Lower extremities: no clubbing, cyanosis, trace edema right worse than left 9. Neurologically Grossly intact, moving all 4 extremities equally   10. MSK: Normal range of motion limited due to pain   All other LABS:     Recent Labs  Lab 05/03/19 1917  WBC 7.7  NEUTROABS 6.2  HGB 13.6  HCT 40.7  MCV 97.6  PLT 155     Recent Labs  Lab 05/03/19 1917 05/03/19 2047  NA 138  --   K 3.8  --   CL 101  --   CO2 26  --   GLUCOSE 81  --   BUN 17  --   CREATININE 0.59 0.50  CALCIUM 8.8*  --      Recent Labs  Lab 05/03/19 1917  AST 27  ALT 12  ALKPHOS 87  BILITOT 2.3*  PROT 7.6  ALBUMIN 3.7       Cultures:    Component Value Date/Time   SDES URINE, RANDOM 02/21/2015 1048   SPECREQUEST NONE 02/21/2015 1048   CULT  02/21/2015 1048    ESCHERICHIA COLI Performed at [facility] 02/23/2015 FINAL 02/21/2015 1048     Radiological Exams on Admission: Dg Chest 2 View  Result Date: 05/03/2019 CLINICAL DATA:  Hip and abdominal pain, shortness of breath EXAM: CHEST - 2 VIEW COMPARISON:   04/02/2015 FINDINGS: Severe thoracolumbar scoliosis. Mild cardiomegaly. Lungs clear. No effusions or edema. No acute bony abnormality. IMPRESSION: No acute cardiopulmonary disease. Severe thoracolumbar scoliosis. Electronically Signed   By: Rolm Baptise M.D.   On: 05/03/2019 22:54   Mr Hip Left Wo Contrast  Result Date: 05/03/2019 CLINICAL DATA:  Patient reports pain last night has gotten so bad, she no longer can bear weight to left hip. EXAM: MR OF THE LEFT HIP WITHOUT CONTRAST TECHNIQUE: Multiplanar, multisequence MR imaging was performed. No intravenous contrast was administered. COMPARISON:  None. FINDINGS: Bones: No hip fracture, dislocation or avascular necrosis. No periosteal reaction or bone destruction. No aggressive osseous lesion. Normal sacrum and sacroiliac joints. No SI joint widening or erosive changes. Mild degenerative changes of the pubic symphysis. Degenerative disc disease with disc height loss and facet arthropathy at L4-5 and L5-S1. Articular cartilage and labrum Articular cartilage: Partial-thickness cartilage loss of the left femoral head and acetabulum. Labrum: Grossly intact, but evaluation is limited by lack of intraarticular fluid. Joint or bursal effusion Joint effusion:  No hip joint effusion.  No SI joint effusion. Bursae:  No bursa formation. Muscles and tendons Flexors: Normal. Extensors: Normal. Abductors: Normal. Adductors: Normal. Gluteals: Normal. Hamstrings: Normal. Other findings Miscellaneous: No pelvic free fluid. Complex cystic pelvic mass measuring 2.9 x 9.1 x 5.6 cm with multiple septations. No inguinal lymphadenopathy. No inguinal hernia. IMPRESSION: 1. No left hip fracture, dislocation or avascular necrosis. 2. Mild osteoarthritis of the left hip. 3. Complex cystic pelvic mass measuring 2.9 x 9.1 x 5.6 cm with multiple septations. This may reflect a cystic ovarian mass versus hydrosalpinx versus other cystic mass. Recommend further characterization with a dedicated  MRI of the pelvis without and with intravenous contrast. Electronically Signed   By: Kathreen Devoid   On: 05/03/2019 15:31   Dg Hip Unilat With Pelvis 2-3 Views Left  Result Date: 05/03/2019 CLINICAL DATA:  Left hip pain EXAM: DG HIP (WITH OR WITHOUT PELVIS) 2-3V LEFT COMPARISON:  None. FINDINGS: There is no evidence of hip fracture or dislocation. There is no evidence of arthropathy or other focal bone abnormality. There is generalized osteopenia. IMPRESSION: No acute osseous injury of the left hip. Given the patient's age and osteopenia, if there is persistent clinical concern for an occult hip fracture, a MRI of the hip is recommended for increased sensitivity. Electronically Signed   By: Kathreen Devoid   On: 05/03/2019 11:08    Chart has been reviewed    Assessment/Plan   85 y.o. female with medical history significant of combined systolic diastolic CHF, paroxysmal atrial fibrillation, severe mitral insufficiency, hypertension, GERD  Admitted for UTI and new diagnosis of pelvic mass   Present on Admission:  Pelvic mass  -discussed with patient she is not interested in extensive surgical procedures at this time but would be amendable to biopsy, discussed with oncology who will see patient in consult  UTI (urinary tract infection) -  - treat with Rocephin         await results of urine culture and adjust antibiotic coverage as needed   GERD (gastroesophageal reflux disease) -chronic stable continue home medications   Paroxysmal atrial fibrillation ([place]) -          - CHA2DS2 vas score 3 :  Not on anticoagulation secondary to Risk of Falls,         -  Rate control:  Currently controlled with  Metoprolol, will continue          Nonrheumatic mitral (valve) insufficiency -followed by cardiology and currently stable  Chronic combined systolic and diastolic heart failure ([place]) -admit on the dry side does not appear to be in any evidence of fluid overload.  Give a bit of gentle fluids  overnight and monitor carefully  Left hip pain -most likely secondary to pelvic mass had extensive imaging showed no evidence of fracture, would benefit from PT OT evaluation and pain medication  Leg edema, right -obtain Doppler to rule out DVT    Other plan as per orders.  DVT prophylaxis:  SCD    Code Status:    DNR/DNI  as per patient  I had personally discussed CODE STATUS with patient    Family Communication:   Family not at  Bedside    Disposition Plan:   To home once workup is complete and patient is stable                     Would benefit from PT/OT eval prior to DC  Ordered                                      Consults called: Oncology is aware case discussed with Dr. Irene Limbo  Admission status:  ED Disposition    ED Disposition Condition [place]: [facility] [100102]  Level of Care: Med-Surg [16]  Covid Evaluation: Asymptomatic Screening Protocol (No Symptoms)  Diagnosis: UTI (urinary tract infection) [003491]  Admitting Physician: Toy Baker [[id number]]  Attending Physician: Toy Baker [[id number]]  PT Class (Do Not Modify): Observation [104]  PT Acc Code (Do Not Modify): Observation [10022]          Obs    Level of care        medical floor    Precautions:  NONE   No active isolations  PPE: Used by the provider:   P100  eye Goggles,  Gloves        Gaye Scorza 05/03/2019, 11:52 PM    [facility]     after 2 AM please page floor coverage PA If 7AM-7PM, please contact the day team taking care of the patient using [email or web address]

## 2019-05-03 NOTE — ED Notes (Signed)
Pt back from MRI 

## 2019-05-03 NOTE — ED Notes (Signed)
Patient attempted to ambulate with walker. Patient reports extreme pain to left leg when standing. Patient used Clarise Cruz steady to get to restroom. Just the motion of sitting to standing hurt patient. Patient reported inability to walk on left leg.

## 2019-05-03 NOTE — ED Notes (Signed)
Patient returned from MRI.

## 2019-05-04 ENCOUNTER — Observation Stay (HOSPITAL_COMMUNITY): Payer: Medicare Other

## 2019-05-04 ENCOUNTER — Encounter (HOSPITAL_COMMUNITY): Payer: Self-pay | Admitting: Oncology

## 2019-05-04 DIAGNOSIS — I480 Paroxysmal atrial fibrillation: Secondary | ICD-10-CM

## 2019-05-04 DIAGNOSIS — I340 Nonrheumatic mitral (valve) insufficiency: Secondary | ICD-10-CM

## 2019-05-04 DIAGNOSIS — Z888 Allergy status to other drugs, medicaments and biological substances status: Secondary | ICD-10-CM | POA: Diagnosis not present

## 2019-05-04 DIAGNOSIS — N838 Other noninflammatory disorders of ovary, fallopian tube and broad ligament: Secondary | ICD-10-CM | POA: Diagnosis not present

## 2019-05-04 DIAGNOSIS — Z1159 Encounter for screening for other viral diseases: Secondary | ICD-10-CM | POA: Diagnosis not present

## 2019-05-04 DIAGNOSIS — I5042 Chronic combined systolic (congestive) and diastolic (congestive) heart failure: Secondary | ICD-10-CM | POA: Diagnosis present

## 2019-05-04 DIAGNOSIS — D696 Thrombocytopenia, unspecified: Secondary | ICD-10-CM | POA: Diagnosis present

## 2019-05-04 DIAGNOSIS — I110 Hypertensive heart disease with heart failure: Secondary | ICD-10-CM | POA: Diagnosis present

## 2019-05-04 DIAGNOSIS — M4186 Other forms of scoliosis, lumbar region: Secondary | ICD-10-CM | POA: Diagnosis not present

## 2019-05-04 DIAGNOSIS — M419 Scoliosis, unspecified: Secondary | ICD-10-CM | POA: Diagnosis present

## 2019-05-04 DIAGNOSIS — E876 Hypokalemia: Secondary | ICD-10-CM | POA: Diagnosis present

## 2019-05-04 DIAGNOSIS — I081 Rheumatic disorders of both mitral and tricuspid valves: Secondary | ICD-10-CM | POA: Diagnosis present

## 2019-05-04 DIAGNOSIS — N839 Noninflammatory disorder of ovary, fallopian tube and broad ligament, unspecified: Secondary | ICD-10-CM | POA: Diagnosis present

## 2019-05-04 DIAGNOSIS — R609 Edema, unspecified: Secondary | ICD-10-CM | POA: Diagnosis not present

## 2019-05-04 DIAGNOSIS — K219 Gastro-esophageal reflux disease without esophagitis: Secondary | ICD-10-CM

## 2019-05-04 DIAGNOSIS — M25552 Pain in left hip: Secondary | ICD-10-CM | POA: Diagnosis present

## 2019-05-04 DIAGNOSIS — R1900 Intra-abdominal and pelvic swelling, mass and lump, unspecified site: Secondary | ICD-10-CM

## 2019-05-04 DIAGNOSIS — Z79899 Other long term (current) drug therapy: Secondary | ICD-10-CM | POA: Diagnosis not present

## 2019-05-04 DIAGNOSIS — R600 Localized edema: Secondary | ICD-10-CM

## 2019-05-04 DIAGNOSIS — B9620 Unspecified Escherichia coli [E. coli] as the cause of diseases classified elsewhere: Secondary | ICD-10-CM | POA: Diagnosis not present

## 2019-05-04 DIAGNOSIS — I700 Atherosclerosis of aorta: Secondary | ICD-10-CM | POA: Diagnosis not present

## 2019-05-04 DIAGNOSIS — R17 Unspecified jaundice: Secondary | ICD-10-CM | POA: Diagnosis present

## 2019-05-04 DIAGNOSIS — M1612 Unilateral primary osteoarthritis, left hip: Secondary | ICD-10-CM | POA: Diagnosis present

## 2019-05-04 DIAGNOSIS — N3000 Acute cystitis without hematuria: Secondary | ICD-10-CM | POA: Diagnosis not present

## 2019-05-04 DIAGNOSIS — Z66 Do not resuscitate: Secondary | ICD-10-CM | POA: Diagnosis present

## 2019-05-04 DIAGNOSIS — Z90711 Acquired absence of uterus with remaining cervical stump: Secondary | ICD-10-CM | POA: Diagnosis not present

## 2019-05-04 DIAGNOSIS — M4134 Thoracogenic scoliosis, thoracic region: Secondary | ICD-10-CM | POA: Diagnosis not present

## 2019-05-04 DIAGNOSIS — Z8249 Family history of ischemic heart disease and other diseases of the circulatory system: Secondary | ICD-10-CM | POA: Diagnosis not present

## 2019-05-04 DIAGNOSIS — N390 Urinary tract infection, site not specified: Secondary | ICD-10-CM | POA: Diagnosis present

## 2019-05-04 DIAGNOSIS — M549 Dorsalgia, unspecified: Secondary | ICD-10-CM | POA: Diagnosis present

## 2019-05-04 DIAGNOSIS — Z9181 History of falling: Secondary | ICD-10-CM | POA: Diagnosis not present

## 2019-05-04 DIAGNOSIS — D649 Anemia, unspecified: Secondary | ICD-10-CM | POA: Diagnosis present

## 2019-05-04 LAB — COMPREHENSIVE METABOLIC PANEL
ALT: 12 U/L (ref 0–44)
AST: 25 U/L (ref 15–41)
Albumin: 3.2 g/dL — ABNORMAL LOW (ref 3.5–5.0)
Alkaline Phosphatase: 75 U/L (ref 38–126)
Anion gap: 13 (ref 5–15)
BUN: 18 mg/dL (ref 8–23)
CO2: 23 mmol/L (ref 22–32)
Calcium: 8.3 mg/dL — ABNORMAL LOW (ref 8.9–10.3)
Chloride: 100 mmol/L (ref 98–111)
Creatinine, Ser: 0.62 mg/dL (ref 0.44–1.00)
GFR calc Af Amer: >60 mL/min (ref >60)
GFR calc non Af Amer: >60 mL/min (ref >60)
Glucose, Bld: 114 mg/dL — ABNORMAL HIGH (ref 70–99)
Potassium: 3.5 mmol/L (ref 3.5–5.1)
Sodium: 136 mmol/L (ref 135–145)
Total Bilirubin: 2 mg/dL — ABNORMAL HIGH (ref 0.3–1.2)
Total Protein: 6.9 g/dL (ref 6.5–8.1)

## 2019-05-04 LAB — CBC
HCT: 38.6 % (ref 36.0–46.0)
Hemoglobin: 12.5 g/dL (ref 12.0–15.0)
MCH: 32 pg (ref 26.0–34.0)
MCHC: 32.4 g/dL (ref 30.0–36.0)
MCV: 98.7 fL (ref 80.0–100.0)
Platelets: 125 10*3/uL — ABNORMAL LOW (ref 150–400)
RBC: 3.91 MIL/uL (ref 3.87–5.11)
RDW: 12.7 % (ref 11.5–15.5)
WBC: 5.8 10*3/uL (ref 4.0–10.5)
nRBC: 0 % (ref 0.0–0.2)

## 2019-05-04 LAB — TSH: TSH: 0.846 u[IU]/mL (ref 0.350–4.500)

## 2019-05-04 LAB — MAGNESIUM: Magnesium: 1.9 mg/dL (ref 1.7–2.4)

## 2019-05-04 LAB — PHOSPHORUS: Phosphorus: 3.8 mg/dL (ref 2.5–4.6)

## 2019-05-04 MED ORDER — ACETAMINOPHEN 650 MG RE SUPP: 650.0000 mg | Freq: Four times a day (QID) | RECTAL | Status: DC | PRN

## 2019-05-04 MED ORDER — ONDANSETRON HCL 4 MG/2ML IJ SOLN: 4.0000 mg | Freq: Four times a day (QID) | INTRAMUSCULAR | Status: DC | PRN

## 2019-05-04 MED ORDER — SODIUM CHLORIDE (PF) 0.9 % IJ SOLN
INTRAMUSCULAR | Status: AC
  Filled 2019-05-04: qty 50

## 2019-05-04 MED ORDER — TRAMADOL HCL 50 MG PO TABS: 50.0000 mg | ORAL_TABLET | Freq: Four times a day (QID) | ORAL | Status: DC | PRN

## 2019-05-04 MED ORDER — POLYETHYLENE GLYCOL 3350 17 G PO PACK: 17.0000 g | PACK | Freq: Every day | ORAL | Status: DC | PRN

## 2019-05-04 MED ORDER — IOHEXOL 300 MG/ML  SOLN
75.0000 mL | Freq: Once | INTRAMUSCULAR | Status: AC | PRN
  Administered 2019-05-04: 75 mL via INTRAVENOUS

## 2019-05-04 MED ORDER — IOHEXOL 300 MG/ML  SOLN
30.0000 mL | Freq: Once | INTRAMUSCULAR | Status: AC | PRN
  Administered 2019-05-04: 12:00:00 30 mL via ORAL

## 2019-05-04 MED ORDER — HYDROCODONE-ACETAMINOPHEN 5-325 MG PO TABS
1.0000 | ORAL_TABLET | ORAL | Status: DC | PRN
  Administered 2019-05-04: 1 via ORAL
  Filled 2019-05-04: qty 1

## 2019-05-04 MED ORDER — BISACODYL 10 MG RE SUPP: 10.0000 mg | Freq: Every day | RECTAL | Status: DC | PRN

## 2019-05-04 MED ORDER — ACETAMINOPHEN 325 MG PO TABS
650.0000 mg | ORAL_TABLET | Freq: Four times a day (QID) | ORAL | Status: DC | PRN
  Administered 2019-05-05 – 2019-05-06 (×4): 650 mg via ORAL
  Filled 2019-05-04 (×4): qty 2

## 2019-05-04 MED ORDER — CARVEDILOL 3.125 MG PO TABS
3.1250 mg | ORAL_TABLET | Freq: Two times a day (BID) | ORAL | Status: DC
  Administered 2019-05-04 – 2019-05-06 (×4): 3.125 mg via ORAL
  Filled 2019-05-04 (×4): qty 1

## 2019-05-04 MED ORDER — LOSARTAN POTASSIUM 25 MG PO TABS
12.5000 mg | ORAL_TABLET | Freq: Every day | ORAL | Status: DC
  Administered 2019-05-04 – 2019-05-06 (×2): 12.5 mg via ORAL
  Filled 2019-05-04 (×3): qty 1

## 2019-05-04 MED ORDER — SODIUM CHLORIDE 0.9 % IV SOLN
INTRAVENOUS | Status: DC
  Administered 2019-05-04: 10:00:00 via INTRAVENOUS

## 2019-05-04 MED ORDER — ONDANSETRON HCL 4 MG PO TABS: 4.0000 mg | ORAL_TABLET | Freq: Four times a day (QID) | ORAL | Status: DC | PRN

## 2019-05-04 MED ORDER — SODIUM CHLORIDE 0.9 % IV BOLUS
250.0000 mL | Freq: Once | INTRAVENOUS | Status: AC
  Administered 2019-05-04: 15:00:00 via INTRAVENOUS

## 2019-05-04 MED ORDER — SODIUM CHLORIDE 0.9 % IV SOLN
INTRAVENOUS | Status: DC
  Administered 2019-05-04: 01:00:00 via INTRAVENOUS

## 2019-05-04 MED ORDER — SODIUM CHLORIDE 0.9 % IV SOLN
1.0000 g | INTRAVENOUS | Status: AC
  Administered 2019-05-04 – 2019-05-05 (×2): 1 g via INTRAVENOUS
  Filled 2019-05-04 (×2): qty 1

## 2019-05-04 NOTE — Evaluation (Signed)
Occupational Therapy Evaluation Patient Details Name: Christina Buckley MRN: [id number] DOB: 09-10-1924 Today's Date: 05/04/2019    History of Present Illness 85 year old female was admitted for L hip pain; uti.  Found to have cystic mass in pelvis, which is being evaluated.  PMH:  CHF, AFib, severe mitral valve insufficiency, and HTN   Clinical Impression   Pt was admitted for the above. She was limited by back pain at time of eval. RN brought medication during session.  Pt wanted to get back to bed after getting up to toilet. At baseline, she is independent with basic adls. She currently needs min A for SPT and mod A for bed mobility.  She needs extensive assist with LB adls due to pain. Will follow in acute setting with the goals listed below.    Follow Up Recommendations  Supervision/Assistance - 24 hour;SNF    Equipment Recommendations  None recommended by OT(as long as she still has 3:1 commode)    Recommendations for Other Services       Precautions / Restrictions Precautions Precautions: Fall Precaution Comments: back pain; has scoliosis      Mobility Bed Mobility Overal bed mobility: Needs Assistance Bed Mobility: Supine to Sit;Sit to Supine;Sit to Sidelying;Sidelying to Sit   Sidelying to sit: Mod assist     Sit to sidelying: Mod assist General bed mobility comments: assist for trunk to get up and for legs for back to bed  Transfers Overall transfer level: Needs assistance Equipment used: 1 person hand held assist Transfers: Sit to/from Stand;Stand Pivot Transfers Sit to Stand: Min assist Stand pivot transfers: Min assist       General transfer comment: used armrests of 3:1 commode    Balance                                           ADL either performed or assessed with clinical judgement   ADL Overall ADL's : Needs assistance/impaired Eating/Feeding: Independent   Grooming: Set up                   Toilet Transfer:  Minimal assistance;Stand-pivot;BSC;RW   Toileting- Clothing Manipulation and Hygiene: Set up;Sit to/from stand         General ADL Comments: pt limited by pain in back during evaluation:  based on clinical judgment, pt needs min A for UB adls and max A for LB adls     Vision         Perception     Praxis      Pertinent Vitals/Pain Pain Assessment: Faces Faces Pain Scale: Hurts even more Pain Location: back Pain Descriptors / Indicators: Aching Pain Intervention(s): Limited activity within patient's tolerance;Monitored during session;Repositioned;Patient requesting pain meds-RN notified;RN gave pain meds during session     Hand Dominance     Extremity/Trunk Assessment Upper Extremity Assessment Upper Extremity Assessment: Generalized weakness       Cervical / Trunk Assessment Cervical / Trunk Assessment: (scoliosis)   Communication Communication Communication: No difficulties   Cognition Arousal/Alertness: Awake/alert Behavior During Therapy: WFL for tasks assessed/performed Overall Cognitive Status: Within Functional Limits for tasks assessed                                     General Comments  Exercises     Shoulder Instructions      Home Living Family/patient expects to be discharged to:: Private residence Living Arrangements: Alone Available Help at Discharge: Family               Bathroom Shower/Tub: Tub/shower unit         Home Equipment: Environmental consultant - 2 wheels;Shower seat;Tub bench          Prior Functioning/Environment Level of Independence: Independent                 OT Problem List: Decreased strength;Decreased activity tolerance;Impaired balance (sitting and/or standing);Pain;Decreased knowledge of use of DME or AE      OT Treatment/Interventions: Self-care/ADL training;DME and/or AE instruction;Patient/family education;Balance training;Therapeutic activities    OT Goals(Current goals can be found in  the care plan section) Acute Rehab OT Goals Patient Stated Goal: just to go home. Pt spoke of her home and heaven OT Goal Formulation: With patient Time For Goal Achievement: 05/18/19 Potential to Achieve Goals: Good ADL Goals Pt Will Transfer to Toilet: with min guard assist;bedside commode;stand pivot transfer Additional ADL Goal #1: pt will complete bed mobility at min A level in preparation for toileting Additional ADL Goal #2: pt will perform UB adl with set up and LB adl with min A using AE as needed  OT Frequency: Min 2X/week   Barriers to D/C:            Co-evaluation              AM-PAC OT "6 Clicks" Daily Activity     Outcome Measure Help from another person eating meals?: None Help from another person taking care of personal grooming?: A Little Help from another person toileting, which includes using toliet, bedpan, or urinal?: A Little Help from another person bathing (including washing, rinsing, drying)?: A Lot Help from another person to put on and taking off regular upper body clothing?: A Little Help from another person to put on and taking off regular lower body clothing?: A Lot 6 Click Score: 17   End of Session    Activity Tolerance: Patient limited by pain Patient left: in bed;with call bell/phone within reach;with bed alarm set  OT Visit Diagnosis: Muscle weakness (generalized) (M62.81);Unsteadiness on feet (R26.81)                Time: 2924-4628 OT Time Calculation (min): 20 min Charges:  OT General Charges $OT Visit: 1 Visit OT Evaluation $OT Eval Low Complexity: [place], OTR/L Acute Rehabilitation Services [phone] [facility] pager [phone] office 05/04/2019  [place] 05/04/2019, 12:47 PM

## 2019-05-04 NOTE — Consult Note (Addendum)
[place]  [phone]) [phone] [phone]   MEDICAL ONCOLOGY - INITIAL CONSULTATION  Referral MD: Dr. Irwin Brakeman  Reason for Referral: Pelvic mass  HPI: Christina Buckley is a 85 year old female with a past medical history significant for combined systolic and diastolic congestive heart failure, paroxysmal atrial fibrillation, severe mitral insufficiency, hypertension, and GERD.  The patient was admitted to the hospital with hip pain.  According to the admission H&P, she had left hip pain but today states that she had right hip pain at home.  She had difficulty walking and presented to the emergency room for evaluation.  In the emergency room, she had an x-ray of her hip which showed no evidence of fracture.  She went on to have an MRI of her left hip which showed no hip fracture, dislocation, or avascular necrosis, mild osteoarthritis of the left hip, complex cystic pelvic mass measuring 2.9 x 9.1 x 5.6 cm with multiple septations, this may reflect a cystic ovarian mass versus hydrosalpinx versus other cystic mass.  She then underwent an MRI of the pelvis which showed a complex cystic lesions along the vaginal cuff likely corresponding to the original ovaries which appeared enlarged and thickened areas of septation, complexity and some of the cystic elements but no well-defined papillary projections of internal enhancement.  Differentials include serous cystadenoma, serous cyst of adenocarcinoma, or ovarian metastatic disease.  The patient was noted to have a urinary tract infection and was started on antibiotics.  When seen today, the patient reports that her hip pain has resolved.  She reports that she has back pain but thinks this is related to laying in bed.  She did not have back pain prior to admission.  Reports anorexia which is been going on for a long time and thinks that she has lost some weight but is unsure how much.  Denies night sweats.  Denies fevers and chills.   She denies dizziness and headaches.  She denies chest discomfort, shortness of breath, cough.  Denies abdominal pain, nausea, vomiting, constipation, diarrhea.  Denies bleeding.  She is unsure of her ability to ambulate at this time as she has not been out of bed since admission.  A CT of the chest, abdomen, pelvis has been ordered and performed but results are pending.  Medical oncology was asked to the patient to make recommendations regarding the mass in her pelvis.   Past Medical History:  Diagnosis Date  . Chronic combined systolic and diastolic CHF (congestive heart failure) ([place])    a. 03/2015 Echo: EF 40-45%, no rwma, Gr2 DD, mild AI, mod-sev MR, mildly dil LA, mod TR, PASP 66mmHg.  Marland Kitchen GERD (gastroesophageal reflux disease)   . Moderate to Severe Mitral Regurgitation with Mitral Valve Prolapse   . Paroxysmal atrial fibrillation ([facility])    a. CHA2DS2VASc = 3-->eliquis 2.5 bid;  b. Amiodarone discontinued June 2016 secondary to nausea and vomiting.  Now rate controlled.   . Scoliosis   :  Past Surgical History:  Procedure Laterality Date  . PARTIAL HYSTERECTOMY    . TONSILLECTOMY AND ADENOIDECTOMY    :  Current Facility-Administered Medications  Medication Dose Route Frequency Provider Last Rate Last Dose  . 0.9 %  sodium chloride infusion   Intravenous Continuous Johnson, Clanford L, MD 10 mL/hr at 05/04/19 0945    . acetaminophen (TYLENOL) tablet 650 mg  650 mg Oral Q6H PRN Toy Baker, MD       Or  . acetaminophen (TYLENOL) suppository  650 mg  650 mg Rectal Q6H PRN Toy Baker, MD      . bisacodyl (DULCOLAX) suppository 10 mg  10 mg Rectal Daily PRN Doutova, Anastassia, MD      . carvedilol (COREG) tablet 3.125 mg  3.125 mg Oral BID WC Doutova, Anastassia, MD   3.125 mg at 05/04/19 0809  . cefTRIAXone (ROCEPHIN) 1 g in sodium chloride 0.9 % 100 mL IVPB  1 g Intravenous Q24H Doutova, Anastassia, MD      . HYDROcodone-acetaminophen (NORCO/VICODIN) 5-325 MG per tablet  1-2 tablet  1-2 tablet Oral Q4H PRN Toy Baker, MD   1 tablet at 05/04/19 1135  . losartan (COZAAR) tablet 12.5 mg  12.5 mg Oral Daily Doutova, Anastassia, MD   12.5 mg at 05/04/19 0944  . ondansetron (ZOFRAN) tablet 4 mg  4 mg Oral Q6H PRN Toy Baker, MD       Or  . ondansetron (ZOFRAN) injection 4 mg  4 mg Intravenous Q6H PRN Doutova, Anastassia, MD      . polyethylene glycol (MIRALAX / GLYCOLAX) packet 17 g  17 g Oral Daily PRN Doutova, Anastassia, MD      . sodium chloride (PF) 0.9 % injection           . traMADol (ULTRAM) tablet 50 mg  50 mg Oral Q6H PRN Toy Baker, MD         Allergies  Allergen Reactions  . Amiodarone Nausea And Vomiting  :  Family History  Problem Relation Age of Onset  . Heart attack Father        in his 56s  . Diabetes Mother   . Heart attack Mother        lived to 46  . Heart attack Paternal Grandfather   :  Social History   Socioeconomic History  . Marital status: Widowed    Spouse name: Not on file  . Number of children: Not on file  . Years of education: Not on file  . Highest education level: Not on file  Occupational History  . Occupation: Optician, dispensing: [employer]    Comment: worked in Cytogeneticist  . Financial resource strain: Not on file  . Food insecurity    Worry: Not on file    Inability: Not on file  . Transportation needs    Medical: Not on file    Non-medical: Not on file  Tobacco Use  . Smoking status: Never Smoker  . Smokeless tobacco: Never Used  Substance and Sexual Activity  . Alcohol use: Never    Frequency: Never  . Drug use: Not on file  . Sexual activity: Not on file  Lifestyle  . Physical activity    Days per week: Not on file    Minutes per session: Not on file  . Stress: Not on file  Relationships  . Social Herbalist on phone: Not on file    Gets together: Not on file    Attends religious service: Not on file    Active member of club or  organization: Not on file    Attends meetings of clubs or organizations: Not on file    Relationship status: Not on file  . Intimate partner violence    Fear of current or ex partner: Not on file    Emotionally abused: Not on file    Physically abused: Not on file    Forced sexual activity: Not on file  Other Topics Concern  . Not on file  Social History Narrative   Lives at home alone, husband passed away last year.  Has multiple children nearby who look in on her daily.  :  Review of Systems: A comprehensive 14-point review of systems was negative except as noted in the HPI.   Exam: Patient Vitals for the past 24 hrs:  BP Temp Temp src Pulse Resp SpO2  05/04/19 1343 (!) 91/51 97.6 F (36.4 C) Oral (!) 126 16 98 %  05/04/19 0939 (!) 102/39 (!) 97.5 F (36.4 C) Oral 70 16 (!) 79 %  05/04/19 0418 106/61 97.7 F (36.5 C) Oral 81 20 100 %  05/04/19 0034 125/67 97.7 F (36.5 C) Oral 94 18 99 %  05/03/19 2330 117/64 - - 92 16 94 %  05/03/19 2144 - - - 86 - 95 %  05/03/19 2130 (!) 109/56 - - 91 16 93 %  05/03/19 1930 127/74 - - 93 17 97 %  05/03/19 1900 124/74 - - 87 - 95 %  05/03/19 1830 128/75 - - 98 - 96 %  05/03/19 1803 136/79 - - (!) 103 18 94 %  05/03/19 1800 136/79 - - 97 - 95 %  05/03/19 1730 128/71 - - 90 - 96 %  05/03/19 1700 125/74 - - 62 - 95 %  05/03/19 1630 127/82 - - 88 18 99 %  05/03/19 1430 139/86 - - 80 - 99 %  05/03/19 1400 121/72 - - 67 18 99 %    General:  Thin female in no acute distress.   Eyes:  no scleral icterus.   ENT:  There were no oropharyngeal lesions.   Neck was without thyromegaly.   Lymphatics:  Negative cervical, supraclavicular or axillary adenopathy.  Respiratory: lungs were clear bilaterally without wheezing or crackles.  Cardiovascular:  Regular rate and rhythm, systolic murmur.  There was no pedal edema.   GI:  abdomen was soft, flat, nontender, nondistended, without organomegaly.  Muscoloskeletal:  no spinal tenderness of palpation of  vertebral spine.  Skin exam was without echymosis, petichae.   Neuro exam was nonfocal. Patient was alert and oriented.  Attention was good.   Language was appropriate.  Mood was normal without depression.  Speech was not pressured.  Thought content was not tangential.    Lab Results  Component Value Date   WBC 5.8 05/04/2019   HGB 12.5 05/04/2019   HCT 38.6 05/04/2019   PLT 125 (L) 05/04/2019   GLUCOSE 114 (H) 05/04/2019   ALT 12 05/04/2019   AST 25 05/04/2019   NA 136 05/04/2019   K 3.5 05/04/2019   CL 100 05/04/2019   CREATININE 0.62 05/04/2019   BUN 18 05/04/2019   CO2 23 05/04/2019    Dg Chest 2 View  Result Date: 05/03/2019 CLINICAL DATA:  Hip and abdominal pain, shortness of breath EXAM: CHEST - 2 VIEW COMPARISON:  04/02/2015 FINDINGS: Severe thoracolumbar scoliosis. Mild cardiomegaly. Lungs clear. No effusions or edema. No acute bony abnormality. IMPRESSION: No acute cardiopulmonary disease. Severe thoracolumbar scoliosis. Electronically Signed   By: Rolm Baptise M.D.   On: 05/03/2019 22:54   Mr Pelvis W Wo Contrast  Result Date: 05/04/2019 CLINICAL DATA:  Ovarian cystic masses. EXAM: MRI PELVIS WITHOUT AND WITH CONTRAST TECHNIQUE: Multiplanar multisequence MR imaging of the pelvis was performed both before and after administration of intravenous contrast. CONTRAST:  7.5 cc Gadavist COMPARISON:  MRI hip from 05/03/2019 FINDINGS: Urinary Tract:  Unremarkable Bowel:  Unremarkable where included. Vascular/Lymphatic: No pathologic adenopathy. Overall unremarkable where included. Reproductive: The uterus is not observed. Above the level of the vaginal cuff, there is a multi-cystic process likely comprised of both ovaries. One of the multi cystic components likely representing the right ovary measures 5.6 by 3.6 by 3.9 cm (volume = 41 cm^3), with multiple septations and components with differing signal intensity but mostly T2 hyperintense. The other component likely corresponding to the  left ovary measures 5.4 by 4.7 by 3.8 cm (volume = 50 cm^3) and likewise contains multiple cysts with somewhat thick septations with minimal if any enhancement, no definite papillary projections, and some variability in signal intensity of the internal lesions. No significant degree of surrounding free fluid. No well-defined enhancement on subtraction images. Other:  No supplemental non-categorized findings. Musculoskeletal: Severe levoconvex lower thoracic and upper lumbar scoliosis. On coronal images, cysts of the lateral segment left hepatic lobe are appreciable IMPRESSION: 1. Complex cystic lesions along the vaginal cuff likely corresponding to the original ovaries, which appear enlarged and with thickened areas of septation, complexity in some of the cystic elements, but no well-defined papillary projections or internal enhancement. The appearance is unusual. When considering bilateral cystic lesions of the ovaries, usually serous cystadenoma, serous cystadenocarcinoma, or ovarian metastatic disease are some of the more common causes. The thick irregular walls and septations without well-defined solid components are more characteristic of mucinous cystadenoma, but mucinous cystadenoma is rarely bilateral. Other cystic multilocular processes can include endometriosis but this would typically be during reproductive age. Borderline tumors can have some of these features but are typically in younger patients and often have papillary projections. Overall the imaging appearance is not entirely specific. I am very skeptical that the appearance is solely due to bilateral hydrosalpinx given the lack of a serpentine appearance. Overall I feel this likely warrants assessment with serologic tumor markers and potentially referral for laparoscopy partially dependent on the patient's other risk factors. 2. Severe levoconvex lower thoracic and upper lumbar scoliosis. 3. Partially imaged cysts of the left hepatic lobe noted.  Electronically Signed   By: Van Clines M.D.   On: 05/04/2019 08:14   Mr Hip Left Wo Contrast  Result Date: 05/03/2019 CLINICAL DATA:  Patient reports pain last night has gotten so bad, she no longer can bear weight to left hip. EXAM: MR OF THE LEFT HIP WITHOUT CONTRAST TECHNIQUE: Multiplanar, multisequence MR imaging was performed. No intravenous contrast was administered. COMPARISON:  None. FINDINGS: Bones: No hip fracture, dislocation or avascular necrosis. No periosteal reaction or bone destruction. No aggressive osseous lesion. Normal sacrum and sacroiliac joints. No SI joint widening or erosive changes. Mild degenerative changes of the pubic symphysis. Degenerative disc disease with disc height loss and facet arthropathy at L4-5 and L5-S1. Articular cartilage and labrum Articular cartilage: Partial-thickness cartilage loss of the left femoral head and acetabulum. Labrum: Grossly intact, but evaluation is limited by lack of intraarticular fluid. Joint or bursal effusion Joint effusion:  No hip joint effusion.  No SI joint effusion. Bursae:  No bursa formation. Muscles and tendons Flexors: Normal. Extensors: Normal. Abductors: Normal. Adductors: Normal. Gluteals: Normal. Hamstrings: Normal. Other findings Miscellaneous: No pelvic free fluid. Complex cystic pelvic mass measuring 2.9 x 9.1 x 5.6 cm with multiple septations. No inguinal lymphadenopathy. No inguinal hernia. IMPRESSION: 1. No left hip fracture, dislocation or avascular necrosis. 2. Mild osteoarthritis of the left hip. 3. Complex cystic pelvic mass measuring 2.9 x 9.1 x 5.6 cm with multiple septations. This may  reflect a cystic ovarian mass versus hydrosalpinx versus other cystic mass. Recommend further characterization with a dedicated MRI of the pelvis without and with intravenous contrast. Electronically Signed   By: Kathreen Devoid   On: 05/03/2019 15:31   Dg Hip Unilat With Pelvis 2-3 Views Left  Result Date: 05/03/2019 CLINICAL DATA:   Left hip pain EXAM: DG HIP (WITH OR WITHOUT PELVIS) 2-3V LEFT COMPARISON:  None. FINDINGS: There is no evidence of hip fracture or dislocation. There is no evidence of arthropathy or other focal bone abnormality. There is generalized osteopenia. IMPRESSION: No acute osseous injury of the left hip. Given the patient's age and osteopenia, if there is persistent clinical concern for an occult hip fracture, a MRI of the hip is recommended for increased sensitivity. Electronically Signed   By: Kathreen Devoid   On: 05/03/2019 11:08   Vas [place] Lower Extremity Venous (dvt)  Result Date: 05/04/2019  Lower Venous Study Indications: Edema.  Risk Factors: None identified. Limitations: Body habitus and poor ultrasound/tissue interface. Comparison Study: No prior studies. Performing Technologist: Oliver Hum RVT  Examination Guidelines: A complete evaluation includes B-mode imaging, spectral Doppler, color Doppler, and power Doppler as needed of all accessible portions of each vessel. Bilateral testing is considered an integral part of a complete examination. Limited examinations for reoccurring indications may be performed as noted.  +---------+---------------+---------+-----------+----------+-------+ RIGHT    CompressibilityPhasicitySpontaneityPropertiesSummary +---------+---------------+---------+-----------+----------+-------+ CFV      Full           Yes      Yes                          +---------+---------------+---------+-----------+----------+-------+ SFJ      Full                                                 +---------+---------------+---------+-----------+----------+-------+ FV Prox  Full                                                 +---------+---------------+---------+-----------+----------+-------+ FV Mid   Full                                                 +---------+---------------+---------+-----------+----------+-------+ FV DistalFull                                                  +---------+---------------+---------+-----------+----------+-------+ PFV      Full                                                 +---------+---------------+---------+-----------+----------+-------+ POP      Full           Yes      Yes                          +---------+---------------+---------+-----------+----------+-------+  PTV      Full                                                 +---------+---------------+---------+-----------+----------+-------+ PERO     Full                                                 +---------+---------------+---------+-----------+----------+-------+   +---------+---------------+---------+-----------+----------+-------+ LEFT     CompressibilityPhasicitySpontaneityPropertiesSummary +---------+---------------+---------+-----------+----------+-------+ CFV      Full           Yes      Yes                          +---------+---------------+---------+-----------+----------+-------+ SFJ      Full                                                 +---------+---------------+---------+-----------+----------+-------+ FV Prox  Full                                                 +---------+---------------+---------+-----------+----------+-------+ FV Mid   Full                                                 +---------+---------------+---------+-----------+----------+-------+ FV DistalFull                                                 +---------+---------------+---------+-----------+----------+-------+ PFV      Full                                                 +---------+---------------+---------+-----------+----------+-------+ POP      Full           Yes      Yes                          +---------+---------------+---------+-----------+----------+-------+ PTV      Full                                                  +---------+---------------+---------+-----------+----------+-------+ PERO     Full                                                 +---------+---------------+---------+-----------+----------+-------+  Summary: Right: There is no evidence of deep vein thrombosis in the lower extremity. No cystic structure found in the popliteal fossa. Left: There is no evidence of deep vein thrombosis in the lower extremity. No cystic structure found in the popliteal fossa.  *See table(s) above for measurements and observations.    Preliminary    Dg Chest 2 View  Result Date: 05/03/2019 CLINICAL DATA:  Hip and abdominal pain, shortness of breath EXAM: CHEST - 2 VIEW COMPARISON:  04/02/2015 FINDINGS: Severe thoracolumbar scoliosis. Mild cardiomegaly. Lungs clear. No effusions or edema. No acute bony abnormality. IMPRESSION: No acute cardiopulmonary disease. Severe thoracolumbar scoliosis. Electronically Signed   By: Rolm Baptise M.D.   On: 05/03/2019 22:54   Mr Pelvis W Wo Contrast  Result Date: 05/04/2019 CLINICAL DATA:  Ovarian cystic masses. EXAM: MRI PELVIS WITHOUT AND WITH CONTRAST TECHNIQUE: Multiplanar multisequence MR imaging of the pelvis was performed both before and after administration of intravenous contrast. CONTRAST:  7.5 cc Gadavist COMPARISON:  MRI hip from 05/03/2019 FINDINGS: Urinary Tract:  Unremarkable Bowel:  Unremarkable where included. Vascular/Lymphatic: No pathologic adenopathy. Overall unremarkable where included. Reproductive: The uterus is not observed. Above the level of the vaginal cuff, there is a multi-cystic process likely comprised of both ovaries. One of the multi cystic components likely representing the right ovary measures 5.6 by 3.6 by 3.9 cm (volume = 41 cm^3), with multiple septations and components with differing signal intensity but mostly T2 hyperintense. The other component likely corresponding to the left ovary measures 5.4 by 4.7 by 3.8 cm (volume = 50 cm^3) and  likewise contains multiple cysts with somewhat thick septations with minimal if any enhancement, no definite papillary projections, and some variability in signal intensity of the internal lesions. No significant degree of surrounding free fluid. No well-defined enhancement on subtraction images. Other:  No supplemental non-categorized findings. Musculoskeletal: Severe levoconvex lower thoracic and upper lumbar scoliosis. On coronal images, cysts of the lateral segment left hepatic lobe are appreciable IMPRESSION: 1. Complex cystic lesions along the vaginal cuff likely corresponding to the original ovaries, which appear enlarged and with thickened areas of septation, complexity in some of the cystic elements, but no well-defined papillary projections or internal enhancement. The appearance is unusual. When considering bilateral cystic lesions of the ovaries, usually serous cystadenoma, serous cystadenocarcinoma, or ovarian metastatic disease are some of the more common causes. The thick irregular walls and septations without well-defined solid components are more characteristic of mucinous cystadenoma, but mucinous cystadenoma is rarely bilateral. Other cystic multilocular processes can include endometriosis but this would typically be during reproductive age. Borderline tumors can have some of these features but are typically in younger patients and often have papillary projections. Overall the imaging appearance is not entirely specific. I am very skeptical that the appearance is solely due to bilateral hydrosalpinx given the lack of a serpentine appearance. Overall I feel this likely warrants assessment with serologic tumor markers and potentially referral for laparoscopy partially dependent on the patient's other risk factors. 2. Severe levoconvex lower thoracic and upper lumbar scoliosis. 3. Partially imaged cysts of the left hepatic lobe noted. Electronically Signed   By: Van Clines M.D.   On:  05/04/2019 08:14   Mr Hip Left Wo Contrast  Result Date: 05/03/2019 CLINICAL DATA:  Patient reports pain last night has gotten so bad, she no longer can bear weight to left hip. EXAM: MR OF THE LEFT HIP WITHOUT CONTRAST TECHNIQUE: Multiplanar, multisequence MR imaging was performed. No intravenous contrast  was administered. COMPARISON:  None. FINDINGS: Bones: No hip fracture, dislocation or avascular necrosis. No periosteal reaction or bone destruction. No aggressive osseous lesion. Normal sacrum and sacroiliac joints. No SI joint widening or erosive changes. Mild degenerative changes of the pubic symphysis. Degenerative disc disease with disc height loss and facet arthropathy at L4-5 and L5-S1. Articular cartilage and labrum Articular cartilage: Partial-thickness cartilage loss of the left femoral head and acetabulum. Labrum: Grossly intact, but evaluation is limited by lack of intraarticular fluid. Joint or bursal effusion Joint effusion:  No hip joint effusion.  No SI joint effusion. Bursae:  No bursa formation. Muscles and tendons Flexors: Normal. Extensors: Normal. Abductors: Normal. Adductors: Normal. Gluteals: Normal. Hamstrings: Normal. Other findings Miscellaneous: No pelvic free fluid. Complex cystic pelvic mass measuring 2.9 x 9.1 x 5.6 cm with multiple septations. No inguinal lymphadenopathy. No inguinal hernia. IMPRESSION: 1. No left hip fracture, dislocation or avascular necrosis. 2. Mild osteoarthritis of the left hip. 3. Complex cystic pelvic mass measuring 2.9 x 9.1 x 5.6 cm with multiple septations. This may reflect a cystic ovarian mass versus hydrosalpinx versus other cystic mass. Recommend further characterization with a dedicated MRI of the pelvis without and with intravenous contrast. Electronically Signed   By: Kathreen Devoid   On: 05/03/2019 15:31   Dg Hip Unilat With Pelvis 2-3 Views Left  Result Date: 05/03/2019 CLINICAL DATA:  Left hip pain EXAM: DG HIP (WITH OR WITHOUT PELVIS) 2-3V  LEFT COMPARISON:  None. FINDINGS: There is no evidence of hip fracture or dislocation. There is no evidence of arthropathy or other focal bone abnormality. There is generalized osteopenia. IMPRESSION: No acute osseous injury of the left hip. Given the patient's age and osteopenia, if there is persistent clinical concern for an occult hip fracture, a MRI of the hip is recommended for increased sensitivity. Electronically Signed   By: Kathreen Devoid   On: 05/03/2019 11:08   Vas [place] Lower Extremity Venous (dvt)  Result Date: 05/04/2019  Lower Venous Study Indications: Edema.  Risk Factors: None identified. Limitations: Body habitus and poor ultrasound/tissue interface. Comparison Study: No prior studies. Performing Technologist: Oliver Hum RVT  Examination Guidelines: A complete evaluation includes B-mode imaging, spectral Doppler, color Doppler, and power Doppler as needed of all accessible portions of each vessel. Bilateral testing is considered an integral part of a complete examination. Limited examinations for reoccurring indications may be performed as noted.  +---------+---------------+---------+-----------+----------+-------+ RIGHT    CompressibilityPhasicitySpontaneityPropertiesSummary +---------+---------------+---------+-----------+----------+-------+ CFV      Full           Yes      Yes                          +---------+---------------+---------+-----------+----------+-------+ SFJ      Full                                                 +---------+---------------+---------+-----------+----------+-------+ FV Prox  Full                                                 +---------+---------------+---------+-----------+----------+-------+ FV Mid   Full                                                 +---------+---------------+---------+-----------+----------+-------+  FV DistalFull                                                  +---------+---------------+---------+-----------+----------+-------+ PFV      Full                                                 +---------+---------------+---------+-----------+----------+-------+ POP      Full           Yes      Yes                          +---------+---------------+---------+-----------+----------+-------+ PTV      Full                                                 +---------+---------------+---------+-----------+----------+-------+ PERO     Full                                                 +---------+---------------+---------+-----------+----------+-------+   +---------+---------------+---------+-----------+----------+-------+ LEFT     CompressibilityPhasicitySpontaneityPropertiesSummary +---------+---------------+---------+-----------+----------+-------+ CFV      Full           Yes      Yes                          +---------+---------------+---------+-----------+----------+-------+ SFJ      Full                                                 +---------+---------------+---------+-----------+----------+-------+ FV Prox  Full                                                 +---------+---------------+---------+-----------+----------+-------+ FV Mid   Full                                                 +---------+---------------+---------+-----------+----------+-------+ FV DistalFull                                                 +---------+---------------+---------+-----------+----------+-------+ PFV      Full                                                 +---------+---------------+---------+-----------+----------+-------+ POP  Full           Yes      Yes                          +---------+---------------+---------+-----------+----------+-------+ PTV      Full                                                 +---------+---------------+---------+-----------+----------+-------+ PERO     Full                                                  +---------+---------------+---------+-----------+----------+-------+     Summary: Right: There is no evidence of deep vein thrombosis in the lower extremity. No cystic structure found in the popliteal fossa. Left: There is no evidence of deep vein thrombosis in the lower extremity. No cystic structure found in the popliteal fossa.  *See table(s) above for measurements and observations.    Preliminary     Assessment and Plan:  This is a 85 year old female with   1. Pelvic mass - the patient has a complex cystic mass noted on her MRI. Differentials include serous cystadenoma, serous cyst of adenocarcinoma, or ovarian metastatic disease. A CT of the chest, abdomen, pelvis has been ordered and results are pending.  I have discussed the work-up to date with the patient.  The patient was very clear that she would not want any biopsy or surgery and that if she has cancer, she would not want to pursue any treatment for this.  Will await the results of the CT scan to have a more detailed discussion of the findings.  2.  Urinary tract infection - antibiotics per hospitalist.  3.  Paroxysmal atrial fibrillation - CHA2DS2 vas score 3, not currently on anticoagulation secondary to risk of fall.  Rate control with current medications.  She is followed by cardiology.  4.  Mitral valve insufficiency -followed by cardiology.  5.  Chronic combined systolic and diastolic heart failure -no evidence of fluid overload.  She is followed by cardiology.  Thank you for this referral.   Mikey Bussing, DNP, AGPCNP-BC, AOCNP   ADDENDUM  .Patient was Personally and independently interviewed, examined and relevant elements of the history of present illness were reviewed in details and an assessment and plan was created. All elements of the patient's history of present illness , assessment and plan were discussed in details with Mikey Bussing NP. The above documentation  reflects our combined findings assessment and plan.  CT chest abdomen pelvis 05/04/2019 reviewed IMPRESSION: 1. Status post hysterectomy. Redemonstrated multiloculated masses in the expected vicinity of the bilateral ovaries, measuring at least 5.1 cm on the right and 6.3 cm on the left (series 2, image 76, 79), better characterized by prior contrast-enhanced MRI and of uncertain nature, differential considerations including ovarian malignancy or alternately metastatic disease to the ovaries.  2. No evidence of lymphadenopathy or metastatic disease, or alternately other primary malignancy in the chest, abdomen, or pelvis.  3. Chronic and incidental findings as detailed above, including severe levoscoliosis of the thoracolumbar spine.   A- 85 year old female with multiple medical comorbidities and limited performance status with  #1 newly diagnosed  bilateral ovarian masses concerning for primary ovarian malignancy versus metastatic malignancy to bilateral ovaries versus less likely benign ovarian serous cystadenoma's.  CT chest abdomen pelvis did not show obvious findings for an alternative primary suggesting likely primary ovarian pathology.  Plan -In the situation the next episode was primarily defined by the patient's goals of care and her wishes. -Definitive intervention would typically involve a GYN oncology consultation to determine if she is a surgical candidate.  Her age and significant medical comorbidities certainly would not make this straightforward. -If she wants to pursue work-up we would also get a baseline Ca 125 level. -If the patient chooses not to pursue surgical intervention or biopsy would likely need to follow-up with her primary care physician for conservative symptom based management and eventual consideration of hospice if there is progression of pathology and further functional deterioration. -At this point the patient is very clear that she does not want  to see GYN oncology and would not want to pursue any further diagnostic or therapeutic intervention.  No biopsies.  No consideration of surgery.   Sullivan Lone MD MS

## 2019-05-04 NOTE — Progress Notes (Signed)
Bilateral lower extremity venous duplex has been completed. Preliminary results can be found in CV Proc through chart review.   05/04/19 8:50 AM Christina Buckley RVT

## 2019-05-04 NOTE — Progress Notes (Signed)
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   MDM/Assessment & Plan:   1. UTI - pt was only mildly symptomatic with frequent urination.  She is being treated with 3 days of ceftriaxone.  She is being treated with supportive fluids.   2. Pelvic Mass - Pt has been incidentally found to have ovarian masses that are being further defined with more imaging.  Pt is drinking contrast this morning for a CT abdomen which is pending. Oncology was consulted but patient was very clear that she would not pursue any treatment for this if it is cancer.   3. GERD - continue PPI for GI protection.  4. PAF - patient is NOT anticoagulated due to High Risk Falls.  5. Chronic combined systolic and diastolic Heart failure - patient is currently compensated and feeling well.  Continue to follow.   6. Left hip pain - Pt continues to deny having fallen down.  Imaging with no findings of fracture.  Pain management ordered and PT eval pending.  7. Right leg edema - doppler US for DVT pending.   DVT prophylaxis: SCDs Code Status: DNR Family Communication: patient updated bedside   Consultants:  Oncology   Procedures:    Antimicrobials:     Subjective: Pt says that her left leg and hip is hurting.   Objective: Vitals:   05/04/19 0418 05/04/19 0939 05/04/19 1343 05/04/19 1418  BP: 106/61 (!) 102/39 (!) 91/51 93/65  Pulse: 81 70 (!) 126 78  Resp: 20 16 16    Temp: 97.7 F (36.5 C) (!) 97.5 F (36.4 C) 97.6 F (36.4 C)   TempSrc: Oral Oral Oral   SpO2: 100% (!) 79% 98%   Weight:      Height:        Intake/Output Summary (Last 24 hours) at 05/04/2019 1521 Last  data filed at 05/04/2019 1400 Gross per 24 hour  Intake 1000.27 ml  Output 650 ml  Net 350.27 ml   Filed Weights   05/03/19 0956  Weight: 43.1 kg   REVIEW OF SYSTEMS  As per history otherwise all reviewed and reported negative  Exam:  General exam: awake, alert, NAD, cooperative.  Respiratory system:  No increased work of breathing. Cardiovascular system: S1 & S2 heard. No JVD, murmurs, gallops, clicks or pedal edema. Gastrointestinal system: Abdomen is nondistended, soft and nontender. Normal bowel sounds heard. Central nervous system: Alert and oriented. No focal neurological deficits. Extremities: mild left leg edema, no cords palpated, normal pedal pulses bilateral. ROM limited due to pain.   Data Reviewed: Basic Metabolic Panel: Recent Labs  Lab 05/03/19 1917 05/03/19 2047 05/04/19 0423  NA 138  --  136  K 3.8  --  3.5  CL 101  --  100  CO2 26  --  23  GLUCOSE 81  --  114*  BUN 17  --  18  CREATININE 0.59 0.50 0.62  CALCIUM 8.8*  --  8.3*  MG  --   --  1.9  PHOS  --   --  3.8   Liver Function Tests: Recent Labs  Lab 05/03/19 1917 05/04/19 0423  AST 27 25  ALT 12 12  ALKPHOS 87 75  BILITOT 2.3*  2.0*  PROT 7.6 6.9  ALBUMIN 3.7 3.2*   No results for input(s): LIPASE, AMYLASE in the last 168 hours. No results for input(s): AMMONIA in the last 168 hours. CBC: Recent Labs  Lab 05/03/19 1917 05/04/19 0423  WBC 7.7 5.8  NEUTROABS 6.2  --   HGB 13.6 12.5  HCT 40.7 38.6  MCV 97.6 98.7  PLT 155 125*   Cardiac Enzymes: No results for input(s): CKTOTAL, CKMB, CKMBINDEX, TROPONINI in the last 168 hours. CBG (last 3)  No results for input(s): GLUCAP in the last 72 hours. Recent Results (from the past 240 hour(s))  SARS Coronavirus 2 (CEPHEID - Performed in [place] hospital lab), Hosp Order     Status: None   Collection Time: 05/03/19  7:18 PM   Specimen: Nasopharyngeal Swab  Result Value Ref Range Status   SARS Coronavirus 2 NEGATIVE NEGATIVE Final     Comment: (NOTE) If result is NEGATIVE SARS-CoV-2 target nucleic acids are NOT DETECTED. The SARS-CoV-2 RNA is generally detectable in upper and lower  respiratory specimens during the acute phase of infection. The lowest  concentration of SARS-CoV-2 viral copies this assay can detect is 250  copies / mL. A negative result does not preclude SARS-CoV-2 infection  and should not be used as the sole basis for treatment or other  patient management decisions.  A negative result may occur with  improper specimen collection / handling, submission of specimen other  than nasopharyngeal swab, presence of viral mutation(s) within the  areas targeted by this assay, and inadequate number of viral copies  (<250 copies / mL). A negative result must be combined with clinical  observations, patient history, and epidemiological information. If result is POSITIVE SARS-CoV-2 target nucleic acids are DETECTED. The SARS-CoV-2 RNA is generally detectable in upper and lower  respiratory specimens dur ing the acute phase of infection.  Positive  results are indicative of active infection with SARS-CoV-2.  Clinical  correlation with patient history and other diagnostic information is  necessary to determine patient infection status.  Positive results do  not rule out bacterial infection or co-infection with other viruses. If result is PRESUMPTIVE POSTIVE SARS-CoV-2 nucleic acids MAY BE PRESENT.   A presumptive positive result was obtained on the submitted specimen  and confirmed on repeat testing.  While 2019 novel coronavirus  (SARS-CoV-2) nucleic acids may be present in the submitted sample  additional confirmatory testing may be necessary for epidemiological  and / or clinical management purposes  to differentiate between  SARS-CoV-2 and other Sarbecovirus currently known to infect humans.  If clinically indicated additional testing with an alternate test  methodology [id number]) is advised. The  SARS-CoV-2 RNA is generally  detectable in upper and lower respiratory sp ecimens during the acute  phase of infection. The expected result is Negative. Fact Sheet for Patients:  StrictlyIdeas.no Fact Sheet for Healthcare Providers: BankingDealers.co.za This test is not yet approved or cleared by the [place] FDA and has been authorized for detection and/or diagnosis of SARS-CoV-2 by FDA under an Emergency Use Authorization (EUA).  This EUA will remain in effect (meaning this test can be used) for the duration of the COVID-19 declaration under Section 564(b)(1) of the Act, 21 U.S.C. section 360bbb-3(b)(1), unless the authorization is terminated or revoked sooner. Performed at [facility], [place] [address] [place], [place] [address]      Studies: Dg Chest 2 View  Result Date: 05/03/2019 CLINICAL DATA:  Hip and abdominal pain, shortness of  breath EXAM: CHEST - 2 VIEW COMPARISON:  04/02/2015 FINDINGS: Severe thoracolumbar scoliosis. Mild cardiomegaly. Lungs clear. No effusions or edema. No acute bony abnormality. IMPRESSION: No acute cardiopulmonary disease. Severe thoracolumbar scoliosis. Electronically Signed   By: Rolm Baptise M.D.   On: 05/03/2019 22:54   Ct Chest W Contrast  Result Date: 05/04/2019 CLINICAL DATA:  Left hip pain, abnormal MRI, palpable pelvic mass EXAM: CT CHEST, ABDOMEN, AND PELVIS WITH CONTRAST TECHNIQUE: Multidetector CT imaging of the chest, abdomen and pelvis was performed following the standard protocol during bolus administration of intravenous contrast. CONTRAST:  66mL OMNIPAQUE IOHEXOL 300 MG/ML SOLN, 38mL OMNIPAQUE IOHEXOL 300 MG/ML SOLN, additional oral enteric contrast COMPARISON:  MR pelvis, 05/03/2019 FINDINGS: CT CHEST FINDINGS Cardiovascular: Mild aortic atherosclerosis. Scattered coronary artery calcifications. Normal heart size. No pericardial effusion. Mediastinum/Nodes: No enlarged  mediastinal, hilar, or axillary lymph nodes. Thyroid gland, trachea, and esophagus demonstrate no significant findings. Lungs/Pleura: Minimal bibasilar scarring or atelectasis. No pleural effusion or pneumothorax. Musculoskeletal: No chest wall mass or suspicious bone lesions identified. CT ABDOMEN PELVIS FINDINGS Hepatobiliary: No solid liver abnormality is seen. Multiple fluid attenuation lesions of the left lobe of the liver, likely simple cysts or hemangiomata. No gallstones, gallbladder wall thickening, or biliary dilatation. Pancreas: Unremarkable. No pancreatic ductal dilatation or surrounding inflammatory changes. Spleen: Normal in size without significant abnormality. Adrenals/Urinary Tract: Adrenal glands are unremarkable. Kidneys are normal, without renal calculi, solid lesion, or hydronephrosis. Bladder is unremarkable. Stomach/Bowel: Stomach is within normal limits. Appendix is not clearly visualized. No evidence of bowel wall thickening, distention, or inflammatory changes. Stool balls in the rectum. Vascular/Lymphatic: Aortic atherosclerosis. No enlarged abdominal or pelvic lymph nodes. Reproductive: Status post hysterectomy. Redemonstrated multiloculated masses in the expected vicinity of the bilateral ovaries, measuring at least 5.1 cm on the right and 6.3 cm on the left (series 2, image 76, 79). Other: No abdominal wall hernia or abnormality. No abdominopelvic ascites. Musculoskeletal: No acute or significant osseous findings. Unusually severe levoscoliosis of the thoracolumbar spine. IMPRESSION: 1. Status post hysterectomy. Redemonstrated multiloculated masses in the expected vicinity of the bilateral ovaries, measuring at least 5.1 cm on the right and 6.3 cm on the left (series 2, image 76, 79), better characterized by prior contrast-enhanced MRI and of uncertain nature, differential considerations including ovarian malignancy or alternately metastatic disease to the ovaries. 2. No evidence of  lymphadenopathy or metastatic disease, or alternately other primary malignancy in the chest, abdomen, or pelvis. 3. Chronic and incidental findings as detailed above, including severe levoscoliosis of the thoracolumbar spine. Electronically Signed   By: Eddie Candle M.D.   On: 05/04/2019 14:19   Mr Pelvis W Wo Contrast  Result Date: 05/04/2019 CLINICAL DATA:  Ovarian cystic masses. EXAM: MRI PELVIS WITHOUT AND WITH CONTRAST TECHNIQUE: Multiplanar multisequence MR imaging of the pelvis was performed both before and after administration of intravenous contrast. CONTRAST:  7.5 cc Gadavist COMPARISON:  MRI hip from 05/03/2019 FINDINGS: Urinary Tract:  Unremarkable Bowel:  Unremarkable where included. Vascular/Lymphatic: No pathologic adenopathy. Overall unremarkable where included. Reproductive: The uterus is not observed. Above the level of the vaginal cuff, there is a multi-cystic process likely comprised of both ovaries. One of the multi cystic components likely representing the right ovary measures 5.6 by 3.6 by 3.9 cm (volume = 41 cm^3), with multiple septations and components with differing signal intensity but mostly T2 hyperintense. The other component likely corresponding to the left ovary measures 5.4 by 4.7 by 3.8 cm (volume = 50 cm^3) and  likewise contains multiple cysts with somewhat thick septations with minimal if any enhancement, no definite papillary projections, and some variability in signal intensity of the internal lesions. No significant degree of surrounding free fluid. No well-defined enhancement on subtraction images. Other:  No supplemental non-categorized findings. Musculoskeletal: Severe levoconvex lower thoracic and upper lumbar scoliosis. On coronal images, cysts of the lateral segment left hepatic lobe are appreciable IMPRESSION: 1. Complex cystic lesions along the vaginal cuff likely corresponding to the original ovaries, which appear enlarged and with thickened areas of septation,  complexity in some of the cystic elements, but no well-defined papillary projections or internal enhancement. The appearance is unusual. When considering bilateral cystic lesions of the ovaries, usually serous cystadenoma, serous cystadenocarcinoma, or ovarian metastatic disease are some of the more common causes. The thick irregular walls and septations without well-defined solid components are more characteristic of mucinous cystadenoma, but mucinous cystadenoma is rarely bilateral. Other cystic multilocular processes can include endometriosis but this would typically be during reproductive age. Borderline tumors can have some of these features but are typically in younger patients and often have papillary projections. Overall the imaging appearance is not entirely specific. I am very skeptical that the appearance is solely due to bilateral hydrosalpinx given the lack of a serpentine appearance. Overall I feel this likely warrants assessment with serologic tumor markers and potentially referral for laparoscopy partially dependent on the patient's other risk factors. 2. Severe levoconvex lower thoracic and upper lumbar scoliosis. 3. Partially imaged cysts of the left hepatic lobe noted. Electronically Signed   By: Van Clines M.D.   On: 05/04/2019 08:14   Ct Abdomen Pelvis W Contrast  Result Date: 05/04/2019 CLINICAL DATA:  Left hip pain, abnormal MRI, palpable pelvic mass EXAM: CT CHEST, ABDOMEN, AND PELVIS WITH CONTRAST TECHNIQUE: Multidetector CT imaging of the chest, abdomen and pelvis was performed following the standard protocol during bolus administration of intravenous contrast. CONTRAST:  31mL OMNIPAQUE IOHEXOL 300 MG/ML SOLN, 17mL OMNIPAQUE IOHEXOL 300 MG/ML SOLN, additional oral enteric contrast COMPARISON:  MR pelvis, 05/03/2019 FINDINGS: CT CHEST FINDINGS Cardiovascular: Mild aortic atherosclerosis. Scattered coronary artery calcifications. Normal heart size. No pericardial effusion.  Mediastinum/Nodes: No enlarged mediastinal, hilar, or axillary lymph nodes. Thyroid gland, trachea, and esophagus demonstrate no significant findings. Lungs/Pleura: Minimal bibasilar scarring or atelectasis. No pleural effusion or pneumothorax. Musculoskeletal: No chest wall mass or suspicious bone lesions identified. CT ABDOMEN PELVIS FINDINGS Hepatobiliary: No solid liver abnormality is seen. Multiple fluid attenuation lesions of the left lobe of the liver, likely simple cysts or hemangiomata. No gallstones, gallbladder wall thickening, or biliary dilatation. Pancreas: Unremarkable. No pancreatic ductal dilatation or surrounding inflammatory changes. Spleen: Normal in size without significant abnormality. Adrenals/Urinary Tract: Adrenal glands are unremarkable. Kidneys are normal, without renal calculi, solid lesion, or hydronephrosis. Bladder is unremarkable. Stomach/Bowel: Stomach is within normal limits. Appendix is not clearly visualized. No evidence of bowel wall thickening, distention, or inflammatory changes. Stool balls in the rectum. Vascular/Lymphatic: Aortic atherosclerosis. No enlarged abdominal or pelvic lymph nodes. Reproductive: Status post hysterectomy. Redemonstrated multiloculated masses in the expected vicinity of the bilateral ovaries, measuring at least 5.1 cm on the right and 6.3 cm on the left (series 2, image 76, 79). Other: No abdominal wall hernia or abnormality. No abdominopelvic ascites. Musculoskeletal: No acute or significant osseous findings. Unusually severe levoscoliosis of the thoracolumbar spine. IMPRESSION: 1. Status post hysterectomy. Redemonstrated multiloculated masses in the expected vicinity of the bilateral ovaries, measuring at least 5.1 cm on the right and  6.3 cm on the left (series 2, image 76, 79), better characterized by prior contrast-enhanced MRI and of uncertain nature, differential considerations including ovarian malignancy or alternately metastatic disease to  the ovaries. 2. No evidence of lymphadenopathy or metastatic disease, or alternately other primary malignancy in the chest, abdomen, or pelvis. 3. Chronic and incidental findings as detailed above, including severe levoscoliosis of the thoracolumbar spine. Electronically Signed   By: Eddie Candle M.D.   On: 05/04/2019 14:19   Mr Hip Left Wo Contrast  Result Date: 05/03/2019 CLINICAL DATA:  Patient reports pain last night has gotten so bad, she no longer can bear weight to left hip. EXAM: MR OF THE LEFT HIP WITHOUT CONTRAST TECHNIQUE: Multiplanar, multisequence MR imaging was performed. No intravenous contrast was administered. COMPARISON:  None. FINDINGS: Bones: No hip fracture, dislocation or avascular necrosis. No periosteal reaction or bone destruction. No aggressive osseous lesion. Normal sacrum and sacroiliac joints. No SI joint widening or erosive changes. Mild degenerative changes of the pubic symphysis. Degenerative disc disease with disc height loss and facet arthropathy at L4-5 and L5-S1. Articular cartilage and labrum Articular cartilage: Partial-thickness cartilage loss of the left femoral head and acetabulum. Labrum: Grossly intact, but evaluation is limited by lack of intraarticular fluid. Joint or bursal effusion Joint effusion:  No hip joint effusion.  No SI joint effusion. Bursae:  No bursa formation. Muscles and tendons Flexors: Normal. Extensors: Normal. Abductors: Normal. Adductors: Normal. Gluteals: Normal. Hamstrings: Normal. Other findings Miscellaneous: No pelvic free fluid. Complex cystic pelvic mass measuring 2.9 x 9.1 x 5.6 cm with multiple septations. No inguinal lymphadenopathy. No inguinal hernia. IMPRESSION: 1. No left hip fracture, dislocation or avascular necrosis. 2. Mild osteoarthritis of the left hip. 3. Complex cystic pelvic mass measuring 2.9 x 9.1 x 5.6 cm with multiple septations. This may reflect a cystic ovarian mass versus hydrosalpinx versus other cystic mass.  Recommend further characterization with a dedicated MRI of the pelvis without and with intravenous contrast. Electronically Signed   By: Kathreen Devoid   On: 05/03/2019 15:31   Dg Hip Unilat With Pelvis 2-3 Views Left  Result Date: 05/03/2019 CLINICAL DATA:  Left hip pain EXAM: DG HIP (WITH OR WITHOUT PELVIS) 2-3V LEFT COMPARISON:  None. FINDINGS: There is no evidence of hip fracture or dislocation. There is no evidence of arthropathy or other focal bone abnormality. There is generalized osteopenia. IMPRESSION: No acute osseous injury of the left hip. Given the patient's age and osteopenia, if there is persistent clinical concern for an occult hip fracture, a MRI of the hip is recommended for increased sensitivity. Electronically Signed   By: Kathreen Devoid   On: 05/03/2019 11:08   Vas [place] Lower Extremity Venous (dvt)  Result Date: 05/04/2019  Lower Venous Study Indications: Edema.  Risk Factors: None identified. Limitations: Body habitus and poor ultrasound/tissue interface. Comparison Study: No prior studies. Performing Technologist: Oliver Hum RVT  Examination Guidelines: A complete evaluation includes B-mode imaging, spectral Doppler, color Doppler, and power Doppler as needed of all accessible portions of each vessel. Bilateral testing is considered an integral part of a complete examination. Limited examinations for reoccurring indications may be performed as noted.  +---------+---------------+---------+-----------+----------+-------+  RIGHT     Compressibility Phasicity Spontaneity Properties Summary  +---------+---------------+---------+-----------+----------+-------+  CFV       Full            Yes       Yes                             +---------+---------------+---------+-----------+----------+-------+  SFJ       Full                                                      +---------+---------------+---------+-----------+----------+-------+  FV Prox   Full                                                       +---------+---------------+---------+-----------+----------+-------+  FV Mid    Full                                                      +---------+---------------+---------+-----------+----------+-------+  FV Distal Full                                                      +---------+---------------+---------+-----------+----------+-------+  PFV       Full                                                      +---------+---------------+---------+-----------+----------+-------+  POP       Full            Yes       Yes                             +---------+---------------+---------+-----------+----------+-------+  PTV       Full                                                      +---------+---------------+---------+-----------+----------+-------+  PERO      Full                                                      +---------+---------------+---------+-----------+----------+-------+   +---------+---------------+---------+-----------+----------+-------+  LEFT      Compressibility Phasicity Spontaneity Properties Summary  +---------+---------------+---------+-----------+----------+-------+  CFV       Full            Yes       Yes                             +---------+---------------+---------+-----------+----------+-------+  SFJ       Full                                                      +---------+---------------+---------+-----------+----------+-------+  FV Prox   Full                                                      +---------+---------------+---------+-----------+----------+-------+  FV Mid    Full                                                      +---------+---------------+---------+-----------+----------+-------+  FV Distal Full                                                      +---------+---------------+---------+-----------+----------+-------+  PFV       Full                                                      +---------+---------------+---------+-----------+----------+-------+  POP        Full            Yes       Yes                             +---------+---------------+---------+-----------+----------+-------+  PTV       Full                                                      +---------+---------------+---------+-----------+----------+-------+  PERO      Full                                                      +---------+---------------+---------+-----------+----------+-------+     Summary: Right: There is no evidence of deep vein thrombosis in the lower extremity. No cystic structure found in the popliteal fossa. Left: There is no evidence of deep vein thrombosis in the lower extremity. No cystic structure found in the popliteal fossa.  *See table(s) above for measurements and observations.    Preliminary    Scheduled Meds:  carvedilol  3.125 mg Oral BID WC   losartan  12.5 mg Oral Daily   sodium chloride (PF)       Continuous Infusions:  sodium chloride 10 mL/hr at 05/04/19 0945   cefTRIAXone (ROCEPHIN)  IV     Active Problems:   GERD (gastroesophageal reflux disease)   Paroxysmal atrial fibrillation (HCC)   Nonrheumatic mitral (valve) insufficiency   Chronic combined systolic and diastolic heart failure (HCC)   UTI (urinary tract infection)   Left hip pain   Leg edema, right   Pelvic mass  Time spent:   Irwin Brakeman, MD [facility] 05/04/2019, 3:21 PM  LOS: 0 days  How to contact the [facility] Attending or Consulting provider [place] or covering provider during after hours [place], for this patient?  1. Check the care team in [facility] and look for a) attending/consulting [facility] provider listed and b) the [facility] team listed 2. Log into [email or web address] and use [place]'s universal password to access. If you do not have the password, please contact the hospital operator. 3. Locate the [facility] provider you are looking for under [facility] and page to a number that you can be directly reached. 4. If you still have difficulty reaching the provider, please page  the [facility] (Director on Call) for the Hospitalists listed on amion for assistance.

## 2019-05-04 NOTE — Progress Notes (Signed)
Paged Dr. Wynetta Emery regarding Patient's Blood pressure new orders

## 2019-05-04 NOTE — Evaluation (Signed)
Physical Therapy Evaluation Patient Details Name: Christina Buckley MRN: [id number] DOB: 02/15/1924 Today's Date: 05/04/2019   History of Present Illness  85 year old female was admitted for UTI, hip/back pain. Found to have cystic mass in pelvis, which is being evaluated.  PMH:  CHF, AFib, severe mitral valve insufficiency, and HTN    Clinical Impression  On eval, pt required Min-Mod assist for mobility. She was able to get to/from [facility] with assistance. Mobility is limited by pain. Pt repeatedly stated that she "just wants to go home." Will continue to follow and progress activity as able.     Follow Up Recommendations SNF vs Home health PT;Supervision/Assistance - 24 hour(depending on progress and pt decision)    Equipment Recommendations  Rolling walker with 5" wheels;3in1 (PT)(if pt doesn't already have them)    Recommendations for Other Services       Precautions / Restrictions Precautions Precautions: Fall Precaution Comments: back pain; has scoliosis Restrictions Weight Bearing Restrictions: No      Mobility  Bed Mobility Overal bed mobility: Needs Assistance Bed Mobility: Supine to Sit;Sit to Supine    Supine to sit: Mod assist Sit to supine: Mod assist  General bed mobility comments: assist for trunk to get up and for legs for back to bed  Transfers Overall transfer level: Needs assistance Equipment used: 1 person hand held assist Transfers: Sit to/from Stand;Stand Pivot Transfers Sit to Stand: Min assist Stand pivot transfers: Min assist       General transfer comment: Assist to rise, stabilize, control descent. Cues for safety. Stand pivot, bed<>bsc  Ambulation/Gait                Stairs            Wheelchair Mobility    Modified Rankin (Stroke Patients Only)       Balance Overall balance assessment: Needs assistance           Standing balance-Leahy Scale: Poor                               Pertinent Vitals/Pain  Pain Assessment: Faces Faces Pain Scale: Hurts even more Pain Location: back Pain Descriptors / Indicators: Grimacing;Guarding;Discomfort Pain Intervention(s): RN gave pain meds during session;Monitored during session;Repositioned;Limited activity within patient's tolerance    Home Living Family/patient expects to be discharged to:: Private residence Living Arrangements: Alone Available Help at Discharge: Family           Home Equipment: Gilford Rile - 2 wheels;Shower seat;Tub bench      Prior Function Level of Independence: Independent               Hand Dominance        Extremity/Trunk Assessment   Upper Extremity Assessment Upper Extremity Assessment: Defer to OT evaluation    Lower Extremity Assessment Lower Extremity Assessment: Generalized weakness    Cervical / Trunk Assessment Cervical / Trunk Assessment: Normal  Communication   Communication: No difficulties  Cognition Arousal/Alertness: Awake/alert Behavior During Therapy: WFL for tasks assessed/performed Overall Cognitive Status: Within Functional Limits for tasks assessed                                        General Comments      Exercises     Assessment/Plan    PT Assessment Patient needs continued PT services  PT Problem List Decreased strength;Decreased mobility;Decreased activity tolerance;Decreased balance;Pain;Decreased knowledge of use of DME       PT Treatment Interventions DME instruction;Gait training;Therapeutic exercise;Therapeutic activities;Patient/family education;Balance training;Functional mobility training    PT Goals (Current goals can be found in the Care Plan section)  Acute Rehab PT Goals Patient Stated Goal: just to go home. Pt spoke of her home and heaven PT Goal Formulation: With patient Time For Goal Achievement: 05/18/19 Potential to Achieve Goals: Fair    Frequency Min 3X/week   Barriers to discharge        Co-evaluation                AM-PAC PT "6 Clicks" Mobility  Outcome Measure Help needed turning from your back to your side while in a flat bed without using bedrails?: A Little Help needed moving from lying on your back to sitting on the side of a flat bed without using bedrails?: A Little Help needed moving to and from a bed to a chair (including a wheelchair)?: A Little Help needed standing up from a chair using your arms (e.g., wheelchair or bedside chair)?: A Little Help needed to walk in hospital room?: Total Help needed climbing 3-5 steps with a railing? : Total 6 Click Score: 14    End of Session   Activity Tolerance: Patient limited by fatigue;Patient limited by pain Patient left: in bed;with call bell/phone within reach   PT Visit Diagnosis: Muscle weakness (generalized) (M62.81);Pain Pain - part of body: Hip(back)    Time: 8270-7867 PT Time Calculation (min) (ACUTE ONLY): 15 min   Charges:   PT Evaluation $PT Eval Moderate Complexity: [place], PT Acute Rehabilitation Services Pager: [phone] Office: [phone]

## 2019-05-05 DIAGNOSIS — N838 Other noninflammatory disorders of ovary, fallopian tube and broad ligament: Secondary | ICD-10-CM

## 2019-05-05 DIAGNOSIS — B9620 Unspecified Escherichia coli [E. coli] as the cause of diseases classified elsewhere: Secondary | ICD-10-CM

## 2019-05-05 DIAGNOSIS — N390 Urinary tract infection, site not specified: Principal | ICD-10-CM

## 2019-05-05 LAB — COMPREHENSIVE METABOLIC PANEL
ALT: 13 U/L (ref 0–44)
AST: 29 U/L (ref 15–41)
Albumin: 3.2 g/dL — ABNORMAL LOW (ref 3.5–5.0)
Alkaline Phosphatase: 72 U/L (ref 38–126)
Anion gap: 10 (ref 5–15)
BUN: 14 mg/dL (ref 8–23)
CO2: 26 mmol/L (ref 22–32)
Calcium: 8.5 mg/dL — ABNORMAL LOW (ref 8.9–10.3)
Chloride: 100 mmol/L (ref 98–111)
Creatinine, Ser: 0.57 mg/dL (ref 0.44–1.00)
GFR calc Af Amer: >60 mL/min (ref >60)
GFR calc non Af Amer: >60 mL/min (ref >60)
Glucose, Bld: 92 mg/dL (ref 70–99)
Potassium: 3.5 mmol/L (ref 3.5–5.1)
Sodium: 136 mmol/L (ref 135–145)
Total Bilirubin: 1.4 mg/dL — ABNORMAL HIGH (ref 0.3–1.2)
Total Protein: 6.7 g/dL (ref 6.5–8.1)

## 2019-05-05 LAB — MAGNESIUM: Magnesium: 2 mg/dL (ref 1.7–2.4)

## 2019-05-05 LAB — CBC WITH DIFFERENTIAL/PLATELET
Abs Immature Granulocytes: 0.02 10*3/uL (ref 0.00–0.07)
Basophils Absolute: 0 10*3/uL (ref 0.0–0.1)
Basophils Relative: 0 %
Eosinophils Absolute: 0 10*3/uL (ref 0.0–0.5)
Eosinophils Relative: 0 %
HCT: 39.6 % (ref 36.0–46.0)
Hemoglobin: 12.9 g/dL (ref 12.0–15.0)
Immature Granulocytes: 0 %
Lymphocytes Relative: 11 %
Lymphs Abs: 0.8 10*3/uL (ref 0.7–4.0)
MCH: 32.6 pg (ref 26.0–34.0)
MCHC: 32.6 g/dL (ref 30.0–36.0)
MCV: 100 fL (ref 80.0–100.0)
Monocytes Absolute: 0.7 10*3/uL (ref 0.1–1.0)
Monocytes Relative: 10 %
Neutro Abs: 5.5 10*3/uL (ref 1.7–7.7)
Neutrophils Relative %: 79 %
Platelets: 120 10*3/uL — ABNORMAL LOW (ref 150–400)
RBC: 3.96 MIL/uL (ref 3.87–5.11)
RDW: 13 % (ref 11.5–15.5)
WBC: 7 10*3/uL (ref 4.0–10.5)
nRBC: 0 % (ref 0.0–0.2)

## 2019-05-05 LAB — URINE CULTURE: Culture: >=100000 — AB

## 2019-05-05 LAB — PHOSPHORUS: Phosphorus: 2.8 mg/dL (ref 2.5–4.6)

## 2019-05-05 MED ORDER — PANTOPRAZOLE SODIUM 40 MG PO TBEC
40.0000 mg | DELAYED_RELEASE_TABLET | Freq: Every day | ORAL | Status: DC
  Administered 2019-05-05 – 2019-05-06 (×2): 40 mg via ORAL
  Filled 2019-05-05 (×2): qty 1

## 2019-05-05 NOTE — Progress Notes (Signed)
PROGRESS NOTE    Christina Buckley  EUM:[id number] DOB: 09/21/1924 DOA: 05/03/2019 PCP: Isaias Sakai, DO   Brief Narrative:  HPI per Dr. Joaquim Nam on 05/03/2019  Christina Buckley is a 85 y.o. female with medical history significant of combined systolic diastolic CHF, paroxysmal atrial fibrillation, severe mitral insufficiency, hypertension, GERD    Presented with left hip pain started yesterday patient denies any trauma pain has gotten worse today and she cannot walk anymore she lives by herself at home no associated fevers or chills no cough no shortness of breath no chest pain  Patient at her baseline able to ambulate and does very well lives independently.  She have not had any recent syncope or dyspnea She gets occasional palpitations but her heart failure is under good control   Infectious risk factors:  Reports none In  ER RAPID COVID TEST NEGATIVE      Regarding pertinent Chronic problems:       HTN on on Cozaar, did not tolerate lisinopril developed a cough, Coreg   CHF diastolic/systolic combined - last echo 03/2015 EF 54-00%, 2 diastolic dysfunction severe MR moderate TR PA CVP 848 mmHg On Lasix      A. Fib -  - CHA2DS2 vas score 3: Not on anticoagulation secondary to risk of falls rate controlled with Coreg        - Rhythm control: Did not tolerate amiodarone,    While in ER: Plain imaging showed no evidence of fracture Had gone to have an MRI done in ER to evaluate for any fracture negative for fracture  But did show cystic mass in her left lower pelvis She patient attempted to ambulate with a walker but it hurt her too much she is unable to put weight on her left leg Repeat MRI of the pelvis was ordered showing bilateral cystic masses on her ovaries unsure if chronic versus malignancy no bony abnormality In emergency department noted to have urinary tract infection started on Rocephin get evaluated The following Work up has been ordered  so far:  **Interim History  States that she is not symptomatic with her urine today but still complaining of left hip pain and states that ambulation is limited secondary to the pain.  Oncology evaluated and patient did not want further work-up currently.  Assessment & Plan:   Active Problems:   GERD (gastroesophageal reflux disease)   Paroxysmal atrial fibrillation (HCC)   Nonrheumatic mitral (valve) insufficiency   Chronic combined systolic and diastolic heart failure (HCC)   UTI (urinary tract infection)   Left hip pain   Leg edema, right   Pelvic mass   Bilateral tubo-ovarian mass  E Coli UTI  -Patient was only mildly symptomatic with frequent urination.   -Urinalysis showed hazy appearance with moderate hemoglobin, trace leukocytes, positive nitrites, rare bacteria, 11-20 WBCs, and a urine culture that showed pansensitive E. coli with greater than 100,000 colony-forming units -She is being treated with 3 days of ceftriaxone and today is day 3 of 3.    -Also was being treated with supportive fluids which have now stopped  Bilateral Ovarian Masses  -Pt has been incidentally found to have ovarian masses that are being further defined with more imaging.  -Pt underwent CT Abd/Pelvis and showed "Status post hysterectomy. Redemonstrated multiloculated masses in the expected vicinity of the bilateral ovaries, measuring at least 5.1 cm on the right and 6.3 cm on the left (series 2, image 76, 79), better characterized by prior contrast-enhanced MRI  and of uncertain nature, differential considerations including ovarian malignancy or alternately metastatic disease to the ovaries. No evidence of lymphadenopathy or metastatic disease, or alternately other primary malignancy in the chest, abdomen, or pelvis. Chronic and incidental findings as detailed above, including severe levoscoliosis of the thoracolumbar spine." -Oncology was consulted but patient was very clear that she would not pursue any  treatment for this if it is cancer.   -Per oncology definitive intervention will typically involve a GYN oncologist consultation to determine if she is surgical candidate and if she wants further work-up they recommend obtaining a baseline CA 125 level but if patient chooses not to pursue surgical intervention or biopsy she would likely need to follow-up with PCP for conservative symptom management and eventual consideration of hospice if she continues to functionally deteriorate  GERD  -Continue PPI and added Pantoprazole for GI protection.   PAF  -Patient is NOT anticoagulated due to High Risk Falls.  -C/w Carvedilol 3.125 mg po BID   HTN -BP was soft so held Losartan 12.5 mg po Daily -C/w Carvedilol 3.125 mg po BID   Chronic Combined Systolic and Diastolic Heart Failure  -Patient is currently compensated and feeling well -C/w Carvedilol 3.125 mg po BID; Held Losartan 12.5 mg po Daily  -Currently holding Furosemide 10 mg po Daily while she was rehydrated; resume Lasix in AM if BP allows   Left Hip Pain  -Pt continues to deny having fallen down.  -X-Ray Imaging with no findings of fracture but may consider MRI if continues to be an issue.   -Pain management ordered with Tramadol 50 mg po q6hprn -PT eval recommending SN vs. Home Health but patient wants to go home. PT recommending Rolling Walker with 5" Wheels and 3in1   Right Leg Edema  -Doppler US for DVT showed no evidence of DVT in the Right and Left LE.   Hyperbilirubinemia -Trending Down -Patient's T Bili went from 2.3 -> 1.4 -Continue to Monitor and Trend Hepatic Fxn  -Repeat CMP in AM  Thrombocytopenia -Trended down slightly and Platelet Count went from 155,000 -> 120,000 -Continue to Monitor for S/Sx of Bleeding -Repeat CBC in AM   DVT prophylaxis: SCDs Code Status: DO NOT RESUSCITATE  Family Communication: No family present at beside  Disposition Plan: Anticipate D/C Home with Home Health vs. SNF in the next few  days   Consultants:   Medical Oncology    Procedures: None  Antimicrobials:  Anti-infectives (From admission, onward)   Start     Dose/Rate Route Frequency Ordered Stop   05/04/19 2200  cefTRIAXone (ROCEPHIN) 1 g in sodium chloride 0.9 % 100 mL IVPB     1 g 200 mL/hr over 30 Minutes Intravenous Every 24 hours 05/04/19 0040 05/05/19 2359   05/03/19 2200  cefTRIAXone (ROCEPHIN) 1 g in sodium chloride 0.9 % 100 mL IVPB     1 g 200 mL/hr over 30 Minutes Intravenous  Once 05/03/19 2148 05/03/19 2232     Subjective: Seen and examined at bedside and was still complaining of pain when she ambulates.  No chest pain, lightheadedness or dizziness.  No nausea or vomiting.  No other concerns or complaints at this time.  Objective: Vitals:   05/05/19 0857 05/05/19 1200 05/05/19 1326 05/05/19 1330  BP: 117/61 (!) 93/56 (!) 101/56 (!) 143/73  Pulse: 80 72 74 100  Resp:   17 17  Temp:    98.4 F (36.9 C)  TempSrc:    Oral  SpO2: 100%  100% 100%  Weight:      Height:        Intake/Output Summary (Last 24 hours) at 05/05/2019 1620 Last data filed at 05/05/2019 1346 Gross per 24 hour  Intake 1214.38 ml  Output 700 ml  Net 514.38 ml   Filed Weights   05/03/19 0956  Weight: 43.1 kg   Examination: Physical Exam:  Constitutional: Thin elderly Caucasian female in NAD and appears calm and comfortable Eyes: Lids and conjunctivae normal, sclerae anicteric  ENMT: External Ears, Nose appear normal. Grossly normal hearing.  Neck: Appears normal, supple, no cervical masses, normal ROM, no appreciable thyromegaly; no JVD Respiratory: Diminished to auscultation bilaterally, no wheezing, rales, rhonchi or crackles. Normal respiratory effort and patient is not tachypenic. No accessory muscle use.  Cardiovascular: RRR, no murmurs / rubs / gallops. S1 and S2 auscultated.  Abdomen: Soft, non-tender, non-distended. No masses palpated. No appreciable hepatosplenomegaly. Bowel sounds positive x4.  GU:  Deferred. Musculoskeletal: No clubbing / cyanosis of digits/nails. No joint deformity upper and lower extremities Skin: No rashes, lesions, ulcers on a limited skin evaluation. No induration; Warm and dry.  Neurologic: CN 2-12 grossly intact with no focal deficits. Romberg sign and cerebellar reflexes not assessed.  Psychiatric: Normal judgment and insight. Alert and oriented x 3. Mildy depressed mood and appropriate affect.   Data Reviewed: I have personally reviewed following labs and imaging studies  CBC: Recent Labs  Lab 05/03/19 1917 05/04/19 0423 05/05/19 0912  WBC 7.7 5.8 7.0  NEUTROABS 6.2  --  5.5  HGB 13.6 12.5 12.9  HCT 40.7 38.6 39.6  MCV 97.6 98.7 100.0  PLT 155 125* 174*   Basic Metabolic Panel: Recent Labs  Lab 05/03/19 1917 05/03/19 2047 05/04/19 0423 05/05/19 0912  NA 138  --  136 136  K 3.8  --  3.5 3.5  CL 101  --  100 100  CO2 26  --  23 26  GLUCOSE 81  --  114* 92  BUN 17  --  18 14  CREATININE 0.59 0.50 0.62 0.57  CALCIUM 8.8*  --  8.3* 8.5*  MG  --   --  1.9 2.0  PHOS  --   --  3.8 2.8   GFR: Estimated Creatinine Clearance: 30.5 mL/min (by C-G formula based on SCr of 0.57 mg/dL). Liver Function Tests: Recent Labs  Lab 05/03/19 1917 05/04/19 0423 05/05/19 0912  AST 27 25 29   ALT 12 12 13   ALKPHOS 87 75 72  BILITOT 2.3* 2.0* 1.4*  PROT 7.6 6.9 6.7  ALBUMIN 3.7 3.2* 3.2*   No results for input(s): LIPASE, AMYLASE in the last 168 hours. No results for input(s): AMMONIA in the last 168 hours. Coagulation Profile: No results for input(s): INR, PROTIME in the last 168 hours. Cardiac Enzymes: No results for input(s): CKTOTAL, CKMB, CKMBINDEX, TROPONINI in the last 168 hours. BNP (last 3 results) No results for input(s): PROBNP in the last 8760 hours. HbA1C: No results for input(s): HGBA1C in the last 72 hours. CBG: No results for input(s): GLUCAP in the last 168 hours. Lipid Profile: No results for input(s): CHOL, HDL, LDLCALC, TRIG,  CHOLHDL, LDLDIRECT in the last 72 hours. Thyroid Function Tests: Recent Labs    05/04/19 0423  TSH 0.846   Anemia Panel: No results for input(s): VITAMINB12, FOLATE, FERRITIN, TIBC, IRON, RETICCTPCT in the last 72 hours. Sepsis Labs: No results for input(s): PROCALCITON, LATICACIDVEN in the last 168 hours.  Recent Results (from the past 240  hour(s))  Urine culture     Status: Abnormal   Collection Time: 05/03/19  7:18 PM   Specimen: Urine, Clean Catch  Result Value Ref Range Status   Specimen Description   Final    URINE, CLEAN CATCH Performed at [facility], [place] [address] [place], [place] [address]    Special Requests   Final    NONE Performed at [facility], [place] [address], [place], [place] [address]    Culture >=100,000 COLONIES/mL ESCHERICHIA COLI (A)  Final   Report Status 05/05/2019 FINAL  Final   Organism ID, Bacteria ESCHERICHIA COLI (A)  Final      Susceptibility   Escherichia coli - MIC*    AMPICILLIN <=2 SENSITIVE Sensitive     CEFAZOLIN <=4 SENSITIVE Sensitive     CEFTRIAXONE <=1 SENSITIVE Sensitive     CIPROFLOXACIN <=0.25 SENSITIVE Sensitive     GENTAMICIN 4 SENSITIVE Sensitive     IMIPENEM <=0.25 SENSITIVE Sensitive     NITROFURANTOIN <=16 SENSITIVE Sensitive     TRIMETH/SULFA <=20 SENSITIVE Sensitive     AMPICILLIN/SULBACTAM <=2 SENSITIVE Sensitive     PIP/TAZO <=4 SENSITIVE Sensitive     Extended ESBL NEGATIVE Sensitive     * >=100,000 COLONIES/mL ESCHERICHIA COLI  SARS Coronavirus 2 (CEPHEID - Performed in [place] hospital lab), Hosp Order     Status: None   Collection Time: 05/03/19  7:18 PM   Specimen: Nasopharyngeal Swab  Result Value Ref Range Status   SARS Coronavirus 2 NEGATIVE NEGATIVE Final    Comment: (NOTE) If result is NEGATIVE SARS-CoV-2 target nucleic acids are NOT DETECTED. The SARS-CoV-2 RNA is generally detectable in upper and lower  respiratory specimens during the acute phase of  infection. The lowest  concentration of SARS-CoV-2 viral copies this assay can detect is 250  copies / mL. A negative result does not preclude SARS-CoV-2 infection  and should not be used as the sole basis for treatment or other  patient management decisions.  A negative result may occur with  improper specimen collection / handling, submission of specimen other  than nasopharyngeal swab, presence of viral mutation(s) within the  areas targeted by this assay, and inadequate number of viral copies  (<250 copies / mL). A negative result must be combined with clinical  observations, patient history, and epidemiological information. If result is POSITIVE SARS-CoV-2 target nucleic acids are DETECTED. The SARS-CoV-2 RNA is generally detectable in upper and lower  respiratory specimens dur ing the acute phase of infection.  Positive  results are indicative of active infection with SARS-CoV-2.  Clinical  correlation with patient history and other diagnostic information is  necessary to determine patient infection status.  Positive results do  not rule out bacterial infection or co-infection with other viruses. If result is PRESUMPTIVE POSTIVE SARS-CoV-2 nucleic acids MAY BE PRESENT.   A presumptive positive result was obtained on the submitted specimen  and confirmed on repeat testing.  While 2019 novel coronavirus  (SARS-CoV-2) nucleic acids may be present in the submitted sample  additional confirmatory testing may be necessary for epidemiological  and / or clinical management purposes  to differentiate between  SARS-CoV-2 and other Sarbecovirus currently known to infect humans.  If clinically indicated additional testing with an alternate test  methodology [phone]) is advised. The SARS-CoV-2 RNA is generally  detectable in upper and lower respiratory sp ecimens during the acute  phase of infection. The expected result is Negative. Fact Sheet for Patients:  StrictlyIdeas.no Fact Sheet for Healthcare Providers: BankingDealers.co.za This test is not yet approved or cleared by the [place] FDA and has been authorized for detection and/or diagnosis of SARS-CoV-2 by FDA under an Emergency Use Authorization (EUA).  This EUA will remain in effect (meaning this test can be used) for the duration of the COVID-19 declaration under Section 564(b)(1) of the Act, 21 U.S.C. section 360bbb-3(b)(1), unless the authorization is terminated or revoked sooner. Performed at [facility], [place] [address], [place], [place] [address]     Radiology Studies: Dg Chest 2 View  Result Date: 05/03/2019 CLINICAL DATA:  Hip and abdominal pain, shortness of breath EXAM: CHEST - 2 VIEW COMPARISON:  04/02/2015 FINDINGS: Severe thoracolumbar scoliosis. Mild cardiomegaly. Lungs clear. No effusions or edema. No acute bony abnormality. IMPRESSION: No acute cardiopulmonary disease. Severe thoracolumbar scoliosis. Electronically Signed   By: Rolm Baptise M.D.   On: 05/03/2019 22:54   Ct Chest W Contrast  Result Date: 05/04/2019 CLINICAL DATA:  Left hip pain, abnormal MRI, palpable pelvic mass EXAM: CT CHEST, ABDOMEN, AND PELVIS WITH CONTRAST TECHNIQUE: Multidetector CT imaging of the chest, abdomen and pelvis was performed following the standard protocol during bolus administration of intravenous contrast. CONTRAST:  11mL OMNIPAQUE IOHEXOL 300 MG/ML SOLN, 78mL OMNIPAQUE IOHEXOL 300 MG/ML SOLN, additional oral enteric contrast COMPARISON:  MR pelvis, 05/03/2019 FINDINGS: CT CHEST FINDINGS Cardiovascular: Mild aortic atherosclerosis. Scattered coronary artery calcifications. Normal heart size. No pericardial effusion. Mediastinum/Nodes: No enlarged mediastinal, hilar, or axillary lymph nodes. Thyroid gland, trachea, and esophagus demonstrate no significant findings. Lungs/Pleura: Minimal bibasilar scarring or atelectasis.  No pleural effusion or pneumothorax. Musculoskeletal: No chest wall mass or suspicious bone lesions identified. CT ABDOMEN PELVIS FINDINGS Hepatobiliary: No solid liver abnormality is seen. Multiple fluid attenuation lesions of the left lobe of the liver, likely simple cysts or hemangiomata. No gallstones, gallbladder wall thickening, or biliary dilatation. Pancreas: Unremarkable. No pancreatic ductal dilatation or surrounding inflammatory changes. Spleen: Normal in size without significant abnormality. Adrenals/Urinary Tract: Adrenal glands are unremarkable. Kidneys are normal, without renal calculi, solid lesion, or hydronephrosis. Bladder is unremarkable. Stomach/Bowel: Stomach is within normal limits. Appendix is not clearly visualized. No evidence of bowel wall thickening, distention, or inflammatory changes. Stool balls in the rectum. Vascular/Lymphatic: Aortic atherosclerosis. No enlarged abdominal or pelvic lymph nodes. Reproductive: Status post hysterectomy. Redemonstrated multiloculated masses in the expected vicinity of the bilateral ovaries, measuring at least 5.1 cm on the right and 6.3 cm on the left (series 2, image 76, 79). Other: No abdominal wall hernia or abnormality. No abdominopelvic ascites. Musculoskeletal: No acute or significant osseous findings. Unusually severe levoscoliosis of the thoracolumbar spine. IMPRESSION: 1. Status post hysterectomy. Redemonstrated multiloculated masses in the expected vicinity of the bilateral ovaries, measuring at least 5.1 cm on the right and 6.3 cm on the left (series 2, image 76, 79), better characterized by prior contrast-enhanced MRI and of uncertain nature, differential considerations including ovarian malignancy or alternately metastatic disease to the ovaries. 2. No evidence of lymphadenopathy or metastatic disease, or alternately other primary malignancy in the chest, abdomen, or pelvis. 3. Chronic and incidental findings as detailed above, including  severe levoscoliosis of the thoracolumbar spine. Electronically Signed   By: Eddie Candle M.D.   On: 05/04/2019 14:19   Mr Pelvis W Wo Contrast  Result Date: 05/04/2019 CLINICAL DATA:  Ovarian cystic masses. EXAM: MRI PELVIS WITHOUT AND WITH CONTRAST TECHNIQUE: Multiplanar multisequence MR imaging of the pelvis was performed both before and after  administration of intravenous contrast. CONTRAST:  7.5 cc Gadavist COMPARISON:  MRI hip from 05/03/2019 FINDINGS: Urinary Tract:  Unremarkable Bowel:  Unremarkable where included. Vascular/Lymphatic: No pathologic adenopathy. Overall unremarkable where included. Reproductive: The uterus is not observed. Above the level of the vaginal cuff, there is a multi-cystic process likely comprised of both ovaries. One of the multi cystic components likely representing the right ovary measures 5.6 by 3.6 by 3.9 cm (volume = 41 cm^3), with multiple septations and components with differing signal intensity but mostly T2 hyperintense. The other component likely corresponding to the left ovary measures 5.4 by 4.7 by 3.8 cm (volume = 50 cm^3) and likewise contains multiple cysts with somewhat thick septations with minimal if any enhancement, no definite papillary projections, and some variability in signal intensity of the internal lesions. No significant degree of surrounding free fluid. No well-defined enhancement on subtraction images. Other:  No supplemental non-categorized findings. Musculoskeletal: Severe levoconvex lower thoracic and upper lumbar scoliosis. On coronal images, cysts of the lateral segment left hepatic lobe are appreciable IMPRESSION: 1. Complex cystic lesions along the vaginal cuff likely corresponding to the original ovaries, which appear enlarged and with thickened areas of septation, complexity in some of the cystic elements, but no well-defined papillary projections or internal enhancement. The appearance is unusual. When considering bilateral cystic lesions  of the ovaries, usually serous cystadenoma, serous cystadenocarcinoma, or ovarian metastatic disease are some of the more common causes. The thick irregular walls and septations without well-defined solid components are more characteristic of mucinous cystadenoma, but mucinous cystadenoma is rarely bilateral. Other cystic multilocular processes can include endometriosis but this would typically be during reproductive age. Borderline tumors can have some of these features but are typically in younger patients and often have papillary projections. Overall the imaging appearance is not entirely specific. I am very skeptical that the appearance is solely due to bilateral hydrosalpinx given the lack of a serpentine appearance. Overall I feel this likely warrants assessment with serologic tumor markers and potentially referral for laparoscopy partially dependent on the patient's other risk factors. 2. Severe levoconvex lower thoracic and upper lumbar scoliosis. 3. Partially imaged cysts of the left hepatic lobe noted. Electronically Signed   By: Van Clines M.D.   On: 05/04/2019 08:14   Ct Abdomen Pelvis W Contrast  Result Date: 05/04/2019 CLINICAL DATA:  Left hip pain, abnormal MRI, palpable pelvic mass EXAM: CT CHEST, ABDOMEN, AND PELVIS WITH CONTRAST TECHNIQUE: Multidetector CT imaging of the chest, abdomen and pelvis was performed following the standard protocol during bolus administration of intravenous contrast. CONTRAST:  69mL OMNIPAQUE IOHEXOL 300 MG/ML SOLN, 52mL OMNIPAQUE IOHEXOL 300 MG/ML SOLN, additional oral enteric contrast COMPARISON:  MR pelvis, 05/03/2019 FINDINGS: CT CHEST FINDINGS Cardiovascular: Mild aortic atherosclerosis. Scattered coronary artery calcifications. Normal heart size. No pericardial effusion. Mediastinum/Nodes: No enlarged mediastinal, hilar, or axillary lymph nodes. Thyroid gland, trachea, and esophagus demonstrate no significant findings. Lungs/Pleura: Minimal bibasilar  scarring or atelectasis. No pleural effusion or pneumothorax. Musculoskeletal: No chest wall mass or suspicious bone lesions identified. CT ABDOMEN PELVIS FINDINGS Hepatobiliary: No solid liver abnormality is seen. Multiple fluid attenuation lesions of the left lobe of the liver, likely simple cysts or hemangiomata. No gallstones, gallbladder wall thickening, or biliary dilatation. Pancreas: Unremarkable. No pancreatic ductal dilatation or surrounding inflammatory changes. Spleen: Normal in size without significant abnormality. Adrenals/Urinary Tract: Adrenal glands are unremarkable. Kidneys are normal, without renal calculi, solid lesion, or hydronephrosis. Bladder is unremarkable. Stomach/Bowel: Stomach is within normal limits. Appendix  is not clearly visualized. No evidence of bowel wall thickening, distention, or inflammatory changes. Stool balls in the rectum. Vascular/Lymphatic: Aortic atherosclerosis. No enlarged abdominal or pelvic lymph nodes. Reproductive: Status post hysterectomy. Redemonstrated multiloculated masses in the expected vicinity of the bilateral ovaries, measuring at least 5.1 cm on the right and 6.3 cm on the left (series 2, image 76, 79). Other: No abdominal wall hernia or abnormality. No abdominopelvic ascites. Musculoskeletal: No acute or significant osseous findings. Unusually severe levoscoliosis of the thoracolumbar spine. IMPRESSION: 1. Status post hysterectomy. Redemonstrated multiloculated masses in the expected vicinity of the bilateral ovaries, measuring at least 5.1 cm on the right and 6.3 cm on the left (series 2, image 76, 79), better characterized by prior contrast-enhanced MRI and of uncertain nature, differential considerations including ovarian malignancy or alternately metastatic disease to the ovaries. 2. No evidence of lymphadenopathy or metastatic disease, or alternately other primary malignancy in the chest, abdomen, or pelvis. 3. Chronic and incidental findings as  detailed above, including severe levoscoliosis of the thoracolumbar spine. Electronically Signed   By: Eddie Candle M.D.   On: 05/04/2019 14:19   Vas [place] Lower Extremity Venous (dvt)  Result Date: 05/04/2019  Lower Venous Study Indications: Edema.  Risk Factors: None identified. Limitations: Body habitus and poor ultrasound/tissue interface. Comparison Study: No prior studies. Performing Technologist: Oliver Hum RVT  Examination Guidelines: A complete evaluation includes B-mode imaging, spectral Doppler, color Doppler, and power Doppler as needed of all accessible portions of each vessel. Bilateral testing is considered an integral part of a complete examination. Limited examinations for reoccurring indications may be performed as noted.  +---------+---------------+---------+-----------+----------+-------+  RIGHT     Compressibility Phasicity Spontaneity Properties Summary  +---------+---------------+---------+-----------+----------+-------+  CFV       Full            Yes       Yes                             +---------+---------------+---------+-----------+----------+-------+  SFJ       Full                                                      +---------+---------------+---------+-----------+----------+-------+  FV Prox   Full                                                      +---------+---------------+---------+-----------+----------+-------+  FV Mid    Full                                                      +---------+---------------+---------+-----------+----------+-------+  FV Distal Full                                                      +---------+---------------+---------+-----------+----------+-------+  PFV  Full                                                      +---------+---------------+---------+-----------+----------+-------+  POP       Full            Yes       Yes                             +---------+---------------+---------+-----------+----------+-------+  PTV       Full                                                       +---------+---------------+---------+-----------+----------+-------+  PERO      Full                                                      +---------+---------------+---------+-----------+----------+-------+   +---------+---------------+---------+-----------+----------+-------+  LEFT      Compressibility Phasicity Spontaneity Properties Summary  +---------+---------------+---------+-----------+----------+-------+  CFV       Full            Yes       Yes                             +---------+---------------+---------+-----------+----------+-------+  SFJ       Full                                                      +---------+---------------+---------+-----------+----------+-------+  FV Prox   Full                                                      +---------+---------------+---------+-----------+----------+-------+  FV Mid    Full                                                      +---------+---------------+---------+-----------+----------+-------+  FV Distal Full                                                      +---------+---------------+---------+-----------+----------+-------+  PFV       Full                                                      +---------+---------------+---------+-----------+----------+-------+  POP       Full            Yes       Yes                             +---------+---------------+---------+-----------+----------+-------+  PTV       Full                                                      +---------+---------------+---------+-----------+----------+-------+  PERO      Full                                                      +---------+---------------+---------+-----------+----------+-------+     Summary: Right: There is no evidence of deep vein thrombosis in the lower extremity. No cystic structure found in the popliteal fossa. Left: There is no evidence of deep vein thrombosis in the lower extremity. No cystic structure found in  the popliteal fossa.  *See table(s) above for measurements and observations. Electronically signed by Harold Barban MD on 05/04/2019 at 3:32:03 PM.    Final    Scheduled Meds:  carvedilol  3.125 mg Oral BID WC   losartan  12.5 mg Oral Daily   Continuous Infusions:  sodium chloride 10 mL/hr at 05/04/19 0945   cefTRIAXone (ROCEPHIN)  IV 1 g (05/04/19 2148)    LOS: 1 day   Kerney Elbe, DO [facility] PAGER is on [place]  If 7PM-7AM, please contact night-coverage [email or web address] Password [email or web address] 05/05/2019, 4:20 PM

## 2019-05-05 NOTE — TOC Initial Note (Signed)
Transition of Care [facility]) - Initial/Assessment Note    Patient Details  Name: Christina Buckley MRN: [id number] Date of Birth: 1924-05-17  Transition of Care [facility]) CM/SW Contact:    Dessa Phi, RN Phone Number: 05/05/2019, 1:37 PM  Clinical Narrative:From home alone, dtr Vaughan Basta will stay @ home,has good family support, has rw,3n1.Dtr plans to d/c home w/HHC-chose [facility]-rep [facility] accepted,decline SNF.recc HHRN/PT/OT/aide/csw.Family has own transport home.                  Expected Discharge Plan: [place] Barriers to Discharge: Continued Medical Work up   Patient Goals and CMS Choice Patient states their goals for this hospitalization and ongoing recovery are:: go home CMS Medicare.gov Compare Post Acute Care list provided to:: Patient Represenative (must comment)(dtr Vaughan Basta) Choice offered to / list presented to : Adult Children  Expected Discharge Plan and Services Expected Discharge Plan: [place]   Discharge Planning Services: CM Consult Post Acute Care Choice: Home Health                                        Prior Living Arrangements/Services   Lives with:: Adult Children   Do you feel safe going back to the place where you live?: Yes      Need for Family Participation in Patient Care: No (Comment) Care giver support system in place?: Yes (comment) Current home services: DME(rw,3n1) Criminal Activity/Legal Involvement Pertinent to Current Situation/Hospitalization: No - Comment as needed  Activities of Daily Living Home Assistive Devices/Equipment: Grab bars in shower ADL Screening (condition at time of admission) Patient's cognitive ability adequate to safely complete daily activities?: Yes Is the patient deaf or have difficulty hearing?: No Does the patient have difficulty seeing, even when wearing glasses/contacts?: No Does the patient have difficulty concentrating, remembering, or making decisions?: No Patient able  to express need for assistance with ADLs?: Yes Does the patient have difficulty dressing or bathing?: No Independently performs ADLs?: Yes (appropriate for developmental age) Does the patient have difficulty walking or climbing stairs?: No Weakness of Legs: None Weakness of Arms/Hands: None  Permission Sought/Granted Permission sought to share information with : Case Manager Permission granted to share information with : Yes, Verbal Permission Granted  Share Information with NAME: dtr Vaughan Basta  Permission granted to share info w AGENCY: [facility]  Permission granted to share info w Relationship: dtr  Permission granted to share info w Contact Information: [phone]  Emotional Assessment Appearance:: Appears stated age Attitude/Demeanor/Rapport: Gracious   Orientation: : Oriented to Self, Oriented to Place, Oriented to  Time, Oriented to Situation Alcohol / Substance Use: Never Used Psych Involvement: No (comment)  Admission diagnosis:  Left hip pain [M25.552] Bilateral tubo-ovarian mass [N83.8] Patient Active Problem List   Diagnosis Date Noted  . Left hip pain 05/04/2019  . Leg edema, right 05/04/2019  . Pelvic mass 05/04/2019  . UTI (urinary tract infection) 05/03/2019  . Underweight 11/29/2018  . Chronic combined systolic and diastolic heart failure ([place]) 01/19/2016  . Scoliosis 04/15/2015  . Paroxysmal atrial fibrillation ([place]) 03/31/2015  . Acute CHF ([place]) 03/31/2015  . Nonrheumatic mitral (valve) insufficiency 03/31/2015  . Nausea and vomiting 03/31/2015  . Diuretic-induced hypokalemia 03/31/2015  . Mitral valve prolapse 02/22/2015  . Severe mitral insufficiency 02/22/2015  . Acute on chronic combined systolic and diastolic congestive heart failure ([place]) 02/22/2015  .  Atrial fibrillation with RVR ([place]) 02/19/2015  . Hypotension 02/19/2015  . GERD (gastroesophageal reflux disease) 02/19/2015   PCP:  Isaias Sakai, DO Pharmacy:   [id number] - [place],  [place] [address] Phone: [phone] Fax: [phone]     Social Determinants of Health (SDOH) Interventions    Readmission Risk Interventions No flowsheet data found.

## 2019-05-06 LAB — CBC WITH DIFFERENTIAL/PLATELET
Abs Immature Granulocytes: 0.01 10*3/uL (ref 0.00–0.07)
Basophils Absolute: 0 10*3/uL (ref 0.0–0.1)
Basophils Relative: 0 %
Eosinophils Absolute: 0.1 10*3/uL (ref 0.0–0.5)
Eosinophils Relative: 2 %
HCT: 35.9 % — ABNORMAL LOW (ref 36.0–46.0)
Hemoglobin: 11.5 g/dL — ABNORMAL LOW (ref 12.0–15.0)
Immature Granulocytes: 0 %
Lymphocytes Relative: 24 %
Lymphs Abs: 1.2 10*3/uL (ref 0.7–4.0)
MCH: 31.3 pg (ref 26.0–34.0)
MCHC: 32 g/dL (ref 30.0–36.0)
MCV: 97.8 fL (ref 80.0–100.0)
Monocytes Absolute: 0.7 10*3/uL (ref 0.1–1.0)
Monocytes Relative: 14 %
Neutro Abs: 3.2 10*3/uL (ref 1.7–7.7)
Neutrophils Relative %: 60 %
Platelets: 122 10*3/uL — ABNORMAL LOW (ref 150–400)
RBC: 3.67 MIL/uL — ABNORMAL LOW (ref 3.87–5.11)
RDW: 12.7 % (ref 11.5–15.5)
WBC: 5.3 10*3/uL (ref 4.0–10.5)
nRBC: 0 % (ref 0.0–0.2)

## 2019-05-06 LAB — COMPREHENSIVE METABOLIC PANEL
ALT: 13 U/L (ref 0–44)
AST: 25 U/L (ref 15–41)
Albumin: 2.9 g/dL — ABNORMAL LOW (ref 3.5–5.0)
Alkaline Phosphatase: 65 U/L (ref 38–126)
Anion gap: 11 (ref 5–15)
BUN: 11 mg/dL (ref 8–23)
CO2: 23 mmol/L (ref 22–32)
Calcium: 8 mg/dL — ABNORMAL LOW (ref 8.9–10.3)
Chloride: 101 mmol/L (ref 98–111)
Creatinine, Ser: 0.61 mg/dL (ref 0.44–1.00)
GFR calc Af Amer: >60 mL/min (ref >60)
GFR calc non Af Amer: >60 mL/min (ref >60)
Glucose, Bld: 107 mg/dL — ABNORMAL HIGH (ref 70–99)
Potassium: 3.2 mmol/L — ABNORMAL LOW (ref 3.5–5.1)
Sodium: 135 mmol/L (ref 135–145)
Total Bilirubin: 1 mg/dL (ref 0.3–1.2)
Total Protein: 6.1 g/dL — ABNORMAL LOW (ref 6.5–8.1)

## 2019-05-06 LAB — PHOSPHORUS: Phosphorus: 2.6 mg/dL (ref 2.5–4.6)

## 2019-05-06 LAB — MAGNESIUM: Magnesium: 2 mg/dL (ref 1.7–2.4)

## 2019-05-06 MED ORDER — TRAMADOL HCL 50 MG PO TABS: 50.0000 mg | ORAL_TABLET | Freq: Four times a day (QID) | ORAL | 0 refills | Status: AC | PRN

## 2019-05-06 MED ORDER — CEFDINIR 300 MG PO CAPS: 300.0000 mg | ORAL_CAPSULE | Freq: Every day | ORAL | 0 refills | Status: AC

## 2019-05-06 MED ORDER — POTASSIUM CHLORIDE CRYS ER 20 MEQ PO TBCR
40.0000 meq | EXTENDED_RELEASE_TABLET | Freq: Two times a day (BID) | ORAL | Status: DC
  Filled 2019-05-06: qty 2

## 2019-05-06 MED ORDER — CEFDINIR 300 MG PO CAPS
300.0000 mg | ORAL_CAPSULE | Freq: Every day | ORAL | Status: DC
  Administered 2019-05-06: 300 mg via ORAL
  Filled 2019-05-06: qty 1

## 2019-05-06 MED ORDER — POLYETHYLENE GLYCOL 3350 17 G PO PACK: 17.0000 g | PACK | Freq: Every day | ORAL | 0 refills | Status: AC | PRN

## 2019-05-06 NOTE — Progress Notes (Signed)
Physical Therapy Treatment Patient Details Name: Christina Buckley MRN: [id number] DOB: [birth year] Today's Date: 05/06/2019    History of Present Illness 85 year old female was admitted for UTI, hip/back pain. Found to have cystic mass in pelvis, which is being evaluated.  PMH:  CHF, AFib, severe mitral valve insufficiency, and HTN    PT Comments    Pt's mobility was much improved this session. Recommend HHPT and initially 24 hour S progressing down to intermittent as she gets stronger and returns to PLOF. She states she already has a walker and BSC.   Follow Up Recommendations  Home health PT;Supervision/Assistance - 24 hour     Equipment Recommendations  None recommended by PT    Recommendations for Other Services       Precautions / Restrictions Precautions Precautions: Fall Restrictions Weight Bearing Restrictions: No    Mobility  Bed Mobility Overal bed mobility: Needs Assistance Bed Mobility: Supine to Sit     Supine to sit: Min guard     General bed mobility comments: Pt able to get up with HOB down.  Transfers Overall transfer level: Needs assistance Equipment used: Rolling walker (2 wheeled) Transfers: Sit to/from Stand Sit to Stand: Min guard         General transfer comment: cues for hand placement. She has slow transition to standing.  Ambulation/Gait Ambulation/Gait assistance: Min guard Gait Distance (Feet): 80 Feet Assistive device: Rolling walker (2 wheeled) Gait Pattern/deviations: Trunk flexed;Decreased step length - right;Decreased step length - left;Step-through pattern Gait velocity: decreased   General Gait Details: Pt with flexed posture and surprised at how weak she has gotten.   Stairs             Wheelchair Mobility    Modified Rankin (Stroke Patients Only)       Balance             Standing balance-Leahy Scale: Poor(requires UE support)                              Cognition Arousal/Alertness:  Awake/alert Behavior During Therapy: WFL for tasks assessed/performed Overall Cognitive Status: Within Functional Limits for tasks assessed                                        Exercises      General Comments        Pertinent Vitals/Pain Pain Assessment: 0-10 Pain Score: 0-No pain    Home Living                      Prior Function            PT Goals (current goals can now be found in the care plan section) Acute Rehab PT Goals PT Goal Formulation: With patient Time For Goal Achievement: 05/18/19 Potential to Achieve Goals: Good Progress towards PT goals: Progressing toward goals    Frequency    Min 3X/week      PT Plan Discharge plan needs to be updated    Co-evaluation              AM-PAC PT "6 Clicks" Mobility   Outcome Measure  Help needed turning from your back to your side while in a flat bed without using bedrails?: None Help needed moving from lying on your back to sitting on the side  of a flat bed without using bedrails?: None Help needed moving to and from a bed to a chair (including a wheelchair)?: A Little Help needed standing up from a chair using your arms (e.g., wheelchair or bedside chair)?: A Little Help needed to walk in hospital room?: A Little Help needed climbing 3-5 steps with a railing? : A Lot 6 Click Score: 19    End of Session Equipment Utilized During Treatment: Gait belt Activity Tolerance: Patient tolerated treatment well Patient left: in chair;with call bell/phone within reach Nurse Communication: Mobility status PT Visit Diagnosis: Muscle weakness (generalized) (M62.81);Pain     Time: 1052-1110 PT Time Calculation (min) (ACUTE ONLY): 18 min  Charges:  $Gait Training: 8-22 mins                     Beverly Ferner L. Tamala Julian, [place] Pager [phone] 05/06/2019    Galen Manila 05/06/2019, 11:23 AM

## 2019-05-06 NOTE — Progress Notes (Signed)
Assessment unchanged. Son called, dc instructions given to pt and son (via phone) with verbalized understanding through teach back around 1300. Discharged via wc to front entrance to meet son.

## 2019-05-06 NOTE — Discharge Summary (Addendum)
Physician Discharge Summary  Christina Buckley ERX:[id number] DOB: 10-11-1924 DOA: 05/03/2019  PCP: Christina Sakai, DO  Admit date: 05/03/2019 Discharge date: 05/06/2019  Admitted From: Home Disposition:  Home with [place] PT/OT/RN/Aide/SW as she refused SNF  Recommendations for Outpatient Follow-up:  1. Follow up with PCP in 1-2 weeks 2. Follow-up with cardiology Dr. Dani Gobble Buckley within 1 to 2 weeks 3. Please obtain CMP/CBC, Mag, Phos in one week 4. Please follow up on the following pending results:  Home Health: Yes Equipment/Devices: Conservation officer, nature with 5" Wheels; 3in1    Discharge Condition: Stable CODE STATUS: DO NOT RESUSCITATE  Diet recommendation:   Brief/Interim Summary: HPI per Dr. Joaquim Buckley on 05/03/2019  Christina H Causeyis a 85 y.o.femalewith medical history significant of combined systolic diastolic CHF, paroxysmal atrial fibrillation, severe mitral insufficiency, hypertension, GERD   Presented with left hip pain started yesterday patient denies any trauma pain has gotten worse today and she cannot walk anymore she lives by herself at home no associated fevers or chills no cough no shortness of breath no chest pain  Patient at her baseline able to ambulate and does very well lives independently. She have not had any recent syncope or dyspnea She gets occasional palpitations but her heart failure is under good control  Infectious risk factors: Reports none In ER RAPID COVID TEST NEGATIVE    Regarding pertinent Chronic problems:   HTN on on Cozaar, did not tolerate lisinopril developed a cough, Coreg  CHFdiastolic/systolic combined - last echo 03/2015 EF 95-09%, 2 diastolic dysfunction severe MR moderate TR PA CVP 848 mmHg On Lasix    A. Fib - - CHA2DS2 vas score 3: Not on anticoagulation secondary to risk of falls rate controlled with Coreg - Rhythm control: Did not tolerate amiodarone,   While in  ER: Plain imaging showed no evidence of fracture Had gone to have an MRI done in ER to evaluate for any fracture negative for fracture  But did show cystic mass in her left lower pelvis She patient attempted to ambulate with a walker but it hurt her too much she is unable to put weight on her left leg Repeat MRI of the pelvis was ordered showing bilateral cystic masses on her ovaries unsure if chronic versus malignancy no bony abnormality In emergency department noted to have urinary tract infection started on Rocephin get evaluated The following Work up has been ordered so far:  **Interim History  States that she is not symptomatic with her urine today but still complaining of left hip pain and states that ambulation is limited secondary to the pain. Oncology evaluated and patient did not want further work-up currently.  She improved and treated with 3 days of IV antibiotics and transition for 2 more days with p.o. antibiotics.  She denies any lightheadedness or dizziness but is felt slightly weak.  Ambulated with physical therapy and will be discharged with home health services. She was deemed medically stable and will be discharged at this time will need to follow-up with PCP within 1 week  Discharge Diagnoses:  Active Problems:   GERD (gastroesophageal reflux disease)   Paroxysmal atrial fibrillation (HCC)   Nonrheumatic mitral (valve) insufficiency   Chronic combined systolic and diastolic heart failure (HCC)   UTI (urinary tract infection)   Left hip pain   Leg edema, right   Pelvic mass   Bilateral tubo-ovarian mass  E Coli UTI  -Patient was only mildly symptomatic with frequent urination.  -Urinalysis showed hazy appearance  with moderate hemoglobin, trace leukocytes, positive nitrites, rare bacteria, 11-20 WBCs, and a urine culture that showed pansensitive E. coli with greater than 100,000 colony-forming units -She was treated with 3 days of IV ceftriaxone and transitioned to  p.o. Omnicef for 2 more days    -Also was being treated with supportive fluids which have now stopped -PT OT recommending SNF patient adamantly refusing wanting go home with home health services  Bilateral Ovarian Masses  -Pt has been incidentally found to have ovarian masses that are being further defined with more imaging.  -Pt underwent CT Abd/Pelvis and showed "Status post hysterectomy. Redemonstrated multiloculated masses in the expected vicinity of the bilateral ovaries, measuring at least 5.1 cm on the right and 6.3 cm on the left (series 2, image 76, 79), better characterized by prior contrast-enhanced MRI and of uncertain nature, differential considerations including ovarian malignancy or alternately metastatic disease to the ovaries. No evidence of lymphadenopathy or metastatic disease, or alternately other primary malignancy in the chest, abdomen, or pelvis. Chronic and incidental findings as detailed above, including severe levoscoliosis of the thoracolumbar spine." -Oncology was consulted but patient was very clear that she would not pursue any treatment for this if it is cancer.  -Per oncology definitive intervention will typically involve a GYN oncologist consultation to determine if she is surgical candidate and if she wants further work-up they recommend obtaining a baseline CA 125 level but if patient chooses not to pursue surgical intervention or biopsy she would likely need to follow-up with PCP for conservative symptom management and eventual consideration of hospice if she continues to functionally deteriorate -Patient does not want this worked up  GERD  -Continue PPI and added Pantoprazole for GI protection.  But will defer to PCP to reinitiate PPI  PAF  -Patient is NOT anticoagulated due to [place].  -C/w Carvedilol 3.125 mg po BID   HTN -BP was soft so held Losartan 12.5 mg po Daily; resume at discharge -C/w Carvedilol 3.125 mg po BID   Chronic Combined  Systolic and Diastolic Heart Failure  -Patient is currently compensated and feeling well -C/w Carvedilol 3.125 mg po BID; Held Losartan 12.5 mg po Daily  -Currently holding Furosemide 10 mg po Daily while she was rehydrated; -Resume home Lasix at discharge follow-up with Dr. Dani Gobble Buckley  Left Hip Pain  -Pt continues to deny having fallen down.  -X-Ray Imaging with no findings of fracture but may consider MRI if continues to be an issue.  -Pain management ordered with Tramadol 50 mg po q6hprn -PT eval recommending SN vs. Home Health but patient wants to go home. PT recommending Rolling Walker with 5" Wheels and 3in1   Right Leg Edema  -Doppler US for DVT showed no evidence of DVT in the Right and Left LE.  Hyperbilirubinemia -Trending Down -Patient's T Bili went from 2.3 -> 1.4 -> 1.0 -Continue to Monitor and Trend Hepatic Fxn  -Repeat CMP in AM  Thrombocytopenia -Trended down slightly and Platelet Count went from 155,000 -> 120,000 -> 122,000 -Continue to Monitor for S/Sx of Bleeding -Repeat CBC in AM   Hypokalemia -Patient's potassium is morning was 3.2 Replete with p.o. potassium chloride 40 mg p.o. twice daily prior to discharge -Continue to monitor replete as necessary -Repeat CMP within 1 week  Normocytic Anemia -Likely dilutional drop initially -Currently no signs and symptoms of bleeding Will need outpatient anemia panel -Continue monitor for signs of bleeding and follow-up with PCP within 1 week to repeat CBC  Discharge Instructions  Discharge Instructions    Call MD for:  difficulty breathing, headache or visual disturbances   Complete by: As directed    Call MD for:  extreme fatigue   Complete by: As directed    Call MD for:  hives   Complete by: As directed    Call MD for:  persistant dizziness or light-headedness   Complete by: As directed    Call MD for:  persistant nausea and vomiting   Complete by: As directed    Call MD for:  redness,  tenderness, or signs of infection (pain, swelling, redness, odor or green/yellow discharge around incision site)   Complete by: As directed    Call MD for:  severe uncontrolled pain   Complete by: As directed    Call MD for:  temperature >100.4   Complete by: As directed    Diet - low sodium heart healthy   Complete by: As directed    Discharge instructions   Complete by: As directed    You were cared for by a hospitalist during your hospital stay. If you have any questions about your discharge medications or the care you received while you were in the hospital after you are discharged, you can call the unit and ask to speak with the hospitalist on call if the hospitalist that took care of you is not available. Once you are discharged, your primary care physician will handle any further medical issues. Please note that NO REFILLS for any discharge medications will be authorized once you are discharged, as it is imperative that you return to your primary care physician (or establish a relationship with a primary care physician if you do not have one) for your aftercare needs so that they can reassess your need for medications and monitor your lab values.  Follow up with PCP and Medical Oncology as needed. Take all medications as prescribed. If symptoms change or worsen please return to the ED for evaluation   Increase activity slowly   Complete by: As directed      Allergies as of 05/06/2019      Reactions   Amiodarone Nausea And Vomiting      Medication List    TAKE these medications   acetaminophen 500 MG tablet Commonly known as: TYLENOL Take 1,000 mg by mouth every 6 (six) hours as needed for headache.   Calcium + D3 600-200 MG-UNIT Tabs Take 1 tablet by mouth daily.   carvedilol 3.125 MG tablet Commonly known as: COREG Take 1 tablet (3.125 mg total) by mouth 2 (two) times daily with a meal.   furosemide 20 MG tablet Commonly known as: LASIX Take 0.5 tablets (10 mg total) by  mouth daily.   losartan 25 MG tablet Commonly known as: COZAAR Take 0.5 tablets (12.5 mg total) by mouth daily.   multivitamin with minerals tablet Take 1 tablet by mouth daily.   polyethylene glycol 17 g packet Commonly known as: MIRALAX / GLYCOLAX Take 17 g by mouth daily as needed for mild constipation.   traMADol 50 MG tablet Commonly known as: ULTRAM Take 1 tablet (50 mg total) by mouth every 6 (six) hours as needed for moderate pain.            Durable Medical Equipment  (From admission, onward)         Start     Ordered   05/06/19 1050  For home use only DME 4 wheeled rolling walker with seat  (Walkers)  Once  Question:  Patient needs a walker to treat with the following condition  Answer:  Generalized weakness   05/06/19 1059   05/06/19 1049  DME 3-in-1  Once     05/06/19 1059         Follow-up Information    [facility] Follow up.   Specialty: [place] Why: [facility] nursing/physical therapy/occupational therapy/aide/social worker       Christina Buckley, [place]. Call.   Specialties: Family Medicine, Obstetrics and Gynecology Why: Follow up within 1 week of D/C Contact information: [place] [address] [phone]        Sanda Klein, MD .   Specialty: Cardiology Contact information: [address] [place] [address] [phone]          Allergies  Allergen Reactions  . Amiodarone Nausea And Vomiting   Consultations:  Medical Oncology  Procedures/Studies: Dg Chest 2 View  Result Date: 05/03/2019 CLINICAL DATA:  Hip and abdominal pain, shortness of breath EXAM: CHEST - 2 VIEW COMPARISON:  04/02/2015 FINDINGS: Severe thoracolumbar scoliosis. Mild cardiomegaly. Lungs clear. No effusions or edema. No acute bony abnormality. IMPRESSION: No acute cardiopulmonary disease. Severe thoracolumbar scoliosis. Electronically Signed   By: Rolm Baptise M.D.   On: 05/03/2019  22:54   Ct Chest W Contrast  Result Date: 05/04/2019 CLINICAL DATA:  Left hip pain, abnormal MRI, palpable pelvic mass EXAM: CT CHEST, ABDOMEN, AND PELVIS WITH CONTRAST TECHNIQUE: Multidetector CT imaging of the chest, abdomen and pelvis was performed following the standard protocol during bolus administration of intravenous contrast. CONTRAST:  60mL OMNIPAQUE IOHEXOL 300 MG/ML SOLN, 4mL OMNIPAQUE IOHEXOL 300 MG/ML SOLN, additional oral enteric contrast COMPARISON:  MR pelvis, 05/03/2019 FINDINGS: CT CHEST FINDINGS Cardiovascular: Mild aortic atherosclerosis. Scattered coronary artery calcifications. Normal heart size. No pericardial effusion. Mediastinum/Nodes: No enlarged mediastinal, hilar, or axillary lymph nodes. Thyroid gland, trachea, and esophagus demonstrate no significant findings. Lungs/Pleura: Minimal bibasilar scarring or atelectasis. No pleural effusion or pneumothorax. Musculoskeletal: No chest wall mass or suspicious bone lesions identified. CT ABDOMEN PELVIS FINDINGS Hepatobiliary: No solid liver abnormality is seen. Multiple fluid attenuation lesions of the left lobe of the liver, likely simple cysts or hemangiomata. No gallstones, gallbladder wall thickening, or biliary dilatation. Pancreas: Unremarkable. No pancreatic ductal dilatation or surrounding inflammatory changes. Spleen: Normal in size without significant abnormality. Adrenals/Urinary Tract: Adrenal glands are unremarkable. Kidneys are normal, without renal calculi, solid lesion, or hydronephrosis. Bladder is unremarkable. Stomach/Bowel: Stomach is within normal limits. Appendix is not clearly visualized. No evidence of bowel wall thickening, distention, or inflammatory changes. Stool balls in the rectum. Vascular/Lymphatic: Aortic atherosclerosis. No enlarged abdominal or pelvic lymph nodes. Reproductive: Status post hysterectomy. Redemonstrated multiloculated masses in the expected vicinity of the bilateral ovaries, measuring at  least 5.1 cm on the right and 6.3 cm on the left (series 2, image 76, 79). Other: No abdominal wall hernia or abnormality. No abdominopelvic ascites. Musculoskeletal: No acute or significant osseous findings. Unusually severe levoscoliosis of the thoracolumbar spine. IMPRESSION: 1. Status post hysterectomy. Redemonstrated multiloculated masses in the expected vicinity of the bilateral ovaries, measuring at least 5.1 cm on the right and 6.3 cm on the left (series 2, image 76, 79), better characterized by prior contrast-enhanced MRI and of uncertain nature, differential considerations including ovarian malignancy or alternately metastatic disease to the ovaries. 2. No evidence of lymphadenopathy or metastatic disease, or alternately other primary malignancy in the chest, abdomen, or pelvis. 3. Chronic and incidental findings  as detailed above, including severe levoscoliosis of the thoracolumbar spine. Electronically Signed   By: Eddie Candle M.D.   On: 05/04/2019 14:19   Mr Pelvis W Wo Contrast  Result Date: 05/04/2019 CLINICAL DATA:  Ovarian cystic masses. EXAM: MRI PELVIS WITHOUT AND WITH CONTRAST TECHNIQUE: Multiplanar multisequence MR imaging of the pelvis was performed both before and after administration of intravenous contrast. CONTRAST:  7.5 cc Gadavist COMPARISON:  MRI hip from 05/03/2019 FINDINGS: Urinary Tract:  Unremarkable Bowel:  Unremarkable where included. Vascular/Lymphatic: No pathologic adenopathy. Overall unremarkable where included. Reproductive: The uterus is not observed. Above the level of the vaginal cuff, there is a multi-cystic process likely comprised of both ovaries. One of the multi cystic components likely representing the right ovary measures 5.6 by 3.6 by 3.9 cm (volume = 41 cm^3), with multiple septations and components with differing signal intensity but mostly T2 hyperintense. The other component likely corresponding to the left ovary measures 5.4 by 4.7 by 3.8 cm (volume = 50  cm^3) and likewise contains multiple cysts with somewhat thick septations with minimal if any enhancement, no definite papillary projections, and some variability in signal intensity of the internal lesions. No significant degree of surrounding free fluid. No well-defined enhancement on subtraction images. Other:  No supplemental non-categorized findings. Musculoskeletal: Severe levoconvex lower thoracic and upper lumbar scoliosis. On coronal images, cysts of the lateral segment left hepatic lobe are appreciable IMPRESSION: 1. Complex cystic lesions along the vaginal cuff likely corresponding to the original ovaries, which appear enlarged and with thickened areas of septation, complexity in some of the cystic elements, but no well-defined papillary projections or internal enhancement. The appearance is unusual. When considering bilateral cystic lesions of the ovaries, usually serous cystadenoma, serous cystadenocarcinoma, or ovarian metastatic disease are some of the more common causes. The thick irregular walls and septations without well-defined solid components are more characteristic of mucinous cystadenoma, but mucinous cystadenoma is rarely bilateral. Other cystic multilocular processes can include endometriosis but this would typically be during reproductive age. Borderline tumors can have some of these features but are typically in younger patients and often have papillary projections. Overall the imaging appearance is not entirely specific. I am very skeptical that the appearance is solely due to bilateral hydrosalpinx given the lack of a serpentine appearance. Overall I feel this likely warrants assessment with serologic tumor markers and potentially referral for laparoscopy partially dependent on the patient's other risk factors. 2. Severe levoconvex lower thoracic and upper lumbar scoliosis. 3. Partially imaged cysts of the left hepatic lobe noted. Electronically Signed   By: Van Clines M.D.    On: 05/04/2019 08:14   Ct Abdomen Pelvis W Contrast  Result Date: 05/04/2019 CLINICAL DATA:  Left hip pain, abnormal MRI, palpable pelvic mass EXAM: CT CHEST, ABDOMEN, AND PELVIS WITH CONTRAST TECHNIQUE: Multidetector CT imaging of the chest, abdomen and pelvis was performed following the standard protocol during bolus administration of intravenous contrast. CONTRAST:  69mL OMNIPAQUE IOHEXOL 300 MG/ML SOLN, 52mL OMNIPAQUE IOHEXOL 300 MG/ML SOLN, additional oral enteric contrast COMPARISON:  MR pelvis, 05/03/2019 FINDINGS: CT CHEST FINDINGS Cardiovascular: Mild aortic atherosclerosis. Scattered coronary artery calcifications. Normal heart size. No pericardial effusion. Mediastinum/Nodes: No enlarged mediastinal, hilar, or axillary lymph nodes. Thyroid gland, trachea, and esophagus demonstrate no significant findings. Lungs/Pleura: Minimal bibasilar scarring or atelectasis. No pleural effusion or pneumothorax. Musculoskeletal: No chest wall mass or suspicious bone lesions identified. CT ABDOMEN PELVIS FINDINGS Hepatobiliary: No solid liver abnormality is seen. Multiple fluid attenuation lesions of  the left lobe of the liver, likely simple cysts or hemangiomata. No gallstones, gallbladder wall thickening, or biliary dilatation. Pancreas: Unremarkable. No pancreatic ductal dilatation or surrounding inflammatory changes. Spleen: Normal in size without significant abnormality. Adrenals/Urinary Tract: Adrenal glands are unremarkable. Kidneys are normal, without renal calculi, solid lesion, or hydronephrosis. Bladder is unremarkable. Stomach/Bowel: Stomach is within normal limits. Appendix is not clearly visualized. No evidence of bowel wall thickening, distention, or inflammatory changes. Stool balls in the rectum. Vascular/Lymphatic: Aortic atherosclerosis. No enlarged abdominal or pelvic lymph nodes. Reproductive: Status post hysterectomy. Redemonstrated multiloculated masses in the expected vicinity of the bilateral  ovaries, measuring at least 5.1 cm on the right and 6.3 cm on the left (series 2, image 76, 79). Other: No abdominal wall hernia or abnormality. No abdominopelvic ascites. Musculoskeletal: No acute or significant osseous findings. Unusually severe levoscoliosis of the thoracolumbar spine. IMPRESSION: 1. Status post hysterectomy. Redemonstrated multiloculated masses in the expected vicinity of the bilateral ovaries, measuring at least 5.1 cm on the right and 6.3 cm on the left (series 2, image 76, 79), better characterized by prior contrast-enhanced MRI and of uncertain nature, differential considerations including ovarian malignancy or alternately metastatic disease to the ovaries. 2. No evidence of lymphadenopathy or metastatic disease, or alternately other primary malignancy in the chest, abdomen, or pelvis. 3. Chronic and incidental findings as detailed above, including severe levoscoliosis of the thoracolumbar spine. Electronically Signed   By: Eddie Candle M.D.   On: 05/04/2019 14:19   Mr Hip Left Wo Contrast  Result Date: 05/03/2019 CLINICAL DATA:  Patient reports pain last night has gotten so bad, she no longer can bear weight to left hip. EXAM: MR OF THE LEFT HIP WITHOUT CONTRAST TECHNIQUE: Multiplanar, multisequence MR imaging was performed. No intravenous contrast was administered. COMPARISON:  None. FINDINGS: Bones: No hip fracture, dislocation or avascular necrosis. No periosteal reaction or bone destruction. No aggressive osseous lesion. Normal sacrum and sacroiliac joints. No SI joint widening or erosive changes. Mild degenerative changes of the pubic symphysis. Degenerative disc disease with disc height loss and facet arthropathy at L4-5 and L5-S1. Articular cartilage and labrum Articular cartilage: Partial-thickness cartilage loss of the left femoral head and acetabulum. Labrum: Grossly intact, but evaluation is limited by lack of intraarticular fluid. Joint or bursal effusion Joint effusion:  No  hip joint effusion.  No SI joint effusion. Bursae:  No bursa formation. Muscles and tendons Flexors: Normal. Extensors: Normal. Abductors: Normal. Adductors: Normal. Gluteals: Normal. Hamstrings: Normal. Other findings Miscellaneous: No pelvic free fluid. Complex cystic pelvic mass measuring 2.9 x 9.1 x 5.6 cm with multiple septations. No inguinal lymphadenopathy. No inguinal hernia. IMPRESSION: 1. No left hip fracture, dislocation or avascular necrosis. 2. Mild osteoarthritis of the left hip. 3. Complex cystic pelvic mass measuring 2.9 x 9.1 x 5.6 cm with multiple septations. This may reflect a cystic ovarian mass versus hydrosalpinx versus other cystic mass. Recommend further characterization with a dedicated MRI of the pelvis without and with intravenous contrast. Electronically Signed   By: Kathreen Devoid   On: 05/03/2019 15:31   Dg Hip Unilat With Pelvis 2-3 Views Left  Result Date: 05/03/2019 CLINICAL DATA:  Left hip pain EXAM: DG HIP (WITH OR WITHOUT PELVIS) 2-3V LEFT COMPARISON:  None. FINDINGS: There is no evidence of hip fracture or dislocation. There is no evidence of arthropathy or other focal bone abnormality. There is generalized osteopenia. IMPRESSION: No acute osseous injury of the left hip. Given the patient's age and osteopenia, if  there is persistent clinical concern for an occult hip fracture, a MRI of the hip is recommended for increased sensitivity. Electronically Signed   By: Kathreen Devoid   On: 05/03/2019 11:08   Vas [place] Lower Extremity Venous (dvt)  Result Date: 05/04/2019  Lower Venous Study Indications: Edema.  Risk Factors: None identified. Limitations: Body habitus and poor ultrasound/tissue interface. Comparison Study: No prior studies. Performing Technologist: Oliver Hum RVT  Examination Guidelines: A complete evaluation includes B-mode imaging, spectral Doppler, color Doppler, and power Doppler as needed of all accessible portions of each vessel. Bilateral testing is  considered an integral part of a complete examination. Limited examinations for reoccurring indications may be performed as noted.  +---------+---------------+---------+-----------+----------+-------+ RIGHT    CompressibilityPhasicitySpontaneityPropertiesSummary +---------+---------------+---------+-----------+----------+-------+ CFV      Full           Yes      Yes                          +---------+---------------+---------+-----------+----------+-------+ SFJ      Full                                                 +---------+---------------+---------+-----------+----------+-------+ FV Prox  Full                                                 +---------+---------------+---------+-----------+----------+-------+ FV Mid   Full                                                 +---------+---------------+---------+-----------+----------+-------+ FV DistalFull                                                 +---------+---------------+---------+-----------+----------+-------+ PFV      Full                                                 +---------+---------------+---------+-----------+----------+-------+ POP      Full           Yes      Yes                          +---------+---------------+---------+-----------+----------+-------+ PTV      Full                                                 +---------+---------------+---------+-----------+----------+-------+ PERO     Full                                                 +---------+---------------+---------+-----------+----------+-------+   +---------+---------------+---------+-----------+----------+-------+  LEFT     CompressibilityPhasicitySpontaneityPropertiesSummary +---------+---------------+---------+-----------+----------+-------+ CFV      Full           Yes      Yes                          +---------+---------------+---------+-----------+----------+-------+ SFJ      Full                                                  +---------+---------------+---------+-----------+----------+-------+ FV Prox  Full                                                 +---------+---------------+---------+-----------+----------+-------+ FV Mid   Full                                                 +---------+---------------+---------+-----------+----------+-------+ FV DistalFull                                                 +---------+---------------+---------+-----------+----------+-------+ PFV      Full                                                 +---------+---------------+---------+-----------+----------+-------+ POP      Full           Yes      Yes                          +---------+---------------+---------+-----------+----------+-------+ PTV      Full                                                 +---------+---------------+---------+-----------+----------+-------+ PERO     Full                                                 +---------+---------------+---------+-----------+----------+-------+     Summary: Right: There is no evidence of deep vein thrombosis in the lower extremity. No cystic structure found in the popliteal fossa. Left: There is no evidence of deep vein thrombosis in the lower extremity. No cystic structure found in the popliteal fossa.  *See table(s) above for measurements and observations. Electronically signed by Harold Barban MD on 05/04/2019 at 3:32:03 PM.    Final     Subjective: Seen and examined felt well with only main complaint was that she is a little weak.  She is wanting to ambulate today and worked well with therapy.  She is being medically stable for discharge  and she understands that she will need to follow-up with PCP within 1 week.  Denies any other concerns complaints at this time.  Discharge Exam: Vitals:   05/06/19 0542 05/06/19 0808  BP: 102/63 105/64  Pulse: 79 75  Resp: 16   Temp: (!) 97.5 F  (36.4 C)   SpO2: 100%    Vitals:   05/05/19 1645 05/05/19 2048 05/06/19 0542 05/06/19 0808  BP: 127/79 111/68 102/63 105/64  Pulse: 91 85 79 75  Resp:  16 16   Temp:  98 F (36.7 C) (!) 97.5 F (36.4 C)   TempSrc:  Oral Oral   SpO2:  97% 100%   Weight:      Height:       General: Pt is alert, awake, not in acute distress Cardiovascular: RRR, S1/S2 +, no rubs, no gallops Respiratory: Slightly diminished bilaterally, no wheezing, no rhonchi Abdominal: Soft, NT, ND, bowel sounds + Extremities: Mild Right Leg edema, no cyanosis  The results of significant diagnostics from this hospitalization (including imaging, microbiology, ancillary and laboratory) are listed below for reference.    Microbiology: Recent Results (from the past 240 hour(s))  Urine culture     Status: Abnormal   Collection Time: 05/03/19  7:18 PM   Specimen: Urine, Clean Catch  Result Value Ref Range Status   Specimen Description   Final    URINE, CLEAN CATCH Performed at [facility], [place] [address] [place], [place] [address]    Special Requests   Final    NONE Performed at [facility], [place] [address], [place], [place] [address]    Culture >=100,000 COLONIES/mL ESCHERICHIA COLI (A)  Final   Report Status 05/05/2019 FINAL  Final   Organism ID, Bacteria ESCHERICHIA COLI (A)  Final      Susceptibility   Escherichia coli - MIC*    AMPICILLIN <=2 SENSITIVE Sensitive     CEFAZOLIN <=4 SENSITIVE Sensitive     CEFTRIAXONE <=1 SENSITIVE Sensitive     CIPROFLOXACIN <=0.25 SENSITIVE Sensitive     GENTAMICIN 4 SENSITIVE Sensitive     IMIPENEM <=0.25 SENSITIVE Sensitive     NITROFURANTOIN <=16 SENSITIVE Sensitive     TRIMETH/SULFA <=20 SENSITIVE Sensitive     AMPICILLIN/SULBACTAM <=2 SENSITIVE Sensitive     PIP/TAZO <=4 SENSITIVE Sensitive     Extended ESBL NEGATIVE Sensitive     * >=100,000 COLONIES/mL ESCHERICHIA COLI  SARS Coronavirus 2 (CEPHEID - Performed in [place] hospital lab), Hosp Order     Status: None   Collection Time: 05/03/19  7:18 PM   Specimen: Nasopharyngeal Swab  Result Value Ref Range Status   SARS Coronavirus 2 NEGATIVE NEGATIVE Final    Comment: (NOTE) If result is NEGATIVE SARS-CoV-2 target nucleic acids are NOT DETECTED. The SARS-CoV-2 RNA is generally detectable in upper and lower  respiratory specimens during the acute phase of infection. The lowest  concentration of SARS-CoV-2 viral copies this assay can detect is 250  copies / mL. A negative result does not preclude SARS-CoV-2 infection  and should not be used as the sole basis for treatment or other  patient management decisions.  A negative result may occur with  improper specimen collection / handling, submission of specimen other  than nasopharyngeal swab, presence of viral mutation(s) within the  areas targeted by this assay, and inadequate number of viral copies  (<250 copies / mL). A negative result must be combined with clinical  observations, patient history, and epidemiological  information. If result is POSITIVE SARS-CoV-2 target nucleic acids are DETECTED. The SARS-CoV-2 RNA is generally detectable in upper and lower  respiratory specimens dur ing the acute phase of infection.  Positive  results are indicative of active infection with SARS-CoV-2.  Clinical  correlation with patient history and other diagnostic information is  necessary to determine patient infection status.  Positive results do  not rule out bacterial infection or co-infection with other viruses. If result is PRESUMPTIVE POSTIVE SARS-CoV-2 nucleic acids MAY BE PRESENT.   A presumptive positive result was obtained on the submitted specimen  and confirmed on repeat testing.  While 2019 novel coronavirus  (SARS-CoV-2) nucleic acids may be present in the submitted sample  additional confirmatory testing may be necessary for epidemiological  and / or clinical management purposes  to  differentiate between  SARS-CoV-2 and other Sarbecovirus currently known to infect humans.  If clinically indicated additional testing with an alternate test  methodology [phone]) is advised. The SARS-CoV-2 RNA is generally  detectable in upper and lower respiratory sp ecimens during the acute  phase of infection. The expected result is Negative. Fact Sheet for Patients:  StrictlyIdeas.no Fact Sheet for Healthcare Providers: BankingDealers.co.za This test is not yet approved or cleared by the [place] FDA and has been authorized for detection and/or diagnosis of SARS-CoV-2 by FDA under an Emergency Use Authorization (EUA).  This EUA will remain in effect (meaning this test can be used) for the duration of the COVID-19 declaration under Section 564(b)(1) of the Act, 21 U.S.C. section 360bbb-3(b)(1), unless the authorization is terminated or revoked sooner. Performed at [facility], [place] [address], [place], [place] [address]     Labs: BNP (last 3 results) No results for input(s): BNP in the last 8760 hours. Basic Metabolic Panel: Recent Labs  Lab 05/03/19 1917 05/03/19 2047 05/04/19 0423 05/05/19 0912 05/06/19 0322  NA 138  --  136 136 135  K 3.8  --  3.5 3.5 3.2*  CL 101  --  100 100 101  CO2 26  --  23 26 23   GLUCOSE 81  --  114* 92 107*  BUN 17  --  18 14 11   CREATININE 0.59 0.50 0.62 0.57 0.61  CALCIUM 8.8*  --  8.3* 8.5* 8.0*  MG  --   --  1.9 2.0 2.0  PHOS  --   --  3.8 2.8 2.6   Liver Function Tests: Recent Labs  Lab 05/03/19 1917 05/04/19 0423 05/05/19 0912 05/06/19 0322  AST 27 25 29 25   ALT 12 12 13 13   ALKPHOS 87 75 72 65  BILITOT 2.3* 2.0* 1.4* 1.0  PROT 7.6 6.9 6.7 6.1*  ALBUMIN 3.7 3.2* 3.2* 2.9*   No results for input(s): LIPASE, AMYLASE in the last 168 hours. No results for input(s): AMMONIA in the last 168 hours. CBC: Recent Labs  Lab 05/03/19 1917 05/04/19 0423  05/05/19 0912 05/06/19 0322  WBC 7.7 5.8 7.0 5.3  NEUTROABS 6.2  --  5.5 3.2  HGB 13.6 12.5 12.9 11.5*  HCT 40.7 38.6 39.6 35.9*  MCV 97.6 98.7 100.0 97.8  PLT 155 125* 120* 122*   Cardiac Enzymes: No results for input(s): CKTOTAL, CKMB, CKMBINDEX, TROPONINI in the last 168 hours. BNP: Invalid input(s): POCBNP CBG: No results for input(s): GLUCAP in the last 168 hours. D-Dimer No results for input(s): DDIMER in the last 72 hours. Hgb A1c No results for input(s): HGBA1C in the last 72 hours. Lipid Profile  No results for input(s): CHOL, HDL, LDLCALC, TRIG, CHOLHDL, LDLDIRECT in the last 72 hours. Thyroid function studies Recent Labs    05/04/19 0423  TSH 0.846   Anemia work up No results for input(s): VITAMINB12, FOLATE, FERRITIN, TIBC, IRON, RETICCTPCT in the last 72 hours. Urinalysis    Component Value Date/Time   COLORURINE YELLOW 05/03/2019 1917   APPEARANCEUR HAZY (A) 05/03/2019 1917   LABSPEC 1.010 05/03/2019 1917   PHURINE 7.0 05/03/2019 1917   GLUCOSEU NEGATIVE 05/03/2019 1917   HGBUR MODERATE (A) 05/03/2019 1917   BILIRUBINUR NEGATIVE 05/03/2019 1917   KETONESUR NEGATIVE 05/03/2019 1917   PROTEINUR NEGATIVE 05/03/2019 1917   UROBILINOGEN 1.0 03/31/2015 1530   NITRITE POSITIVE (A) 05/03/2019 [place] (A) 05/03/2019 1917   Sepsis Labs Invalid input(s): PROCALCITONIN,  WBC,  LACTICIDVEN Microbiology Recent Results (from the past 240 hour(s))  Urine culture     Status: Abnormal   Collection Time: 05/03/19  7:18 PM   Specimen: Urine, Clean Catch  Result Value Ref Range Status   Specimen Description   Final    URINE, CLEAN CATCH Performed at [facility], [place] [address], [place], [place] [address]    Special Requests   Final    NONE Performed at [facility], [place] [address], [place], [place] [address]    Culture >=100,000 COLONIES/mL ESCHERICHIA COLI (A)  Final   Report Status 05/05/2019 FINAL   Final   Organism ID, Bacteria ESCHERICHIA COLI (A)  Final      Susceptibility   Escherichia coli - MIC*    AMPICILLIN <=2 SENSITIVE Sensitive     CEFAZOLIN <=4 SENSITIVE Sensitive     CEFTRIAXONE <=1 SENSITIVE Sensitive     CIPROFLOXACIN <=0.25 SENSITIVE Sensitive     GENTAMICIN 4 SENSITIVE Sensitive     IMIPENEM <=0.25 SENSITIVE Sensitive     NITROFURANTOIN <=16 SENSITIVE Sensitive     TRIMETH/SULFA <=20 SENSITIVE Sensitive     AMPICILLIN/SULBACTAM <=2 SENSITIVE Sensitive     PIP/TAZO <=4 SENSITIVE Sensitive     Extended ESBL NEGATIVE Sensitive     * >=100,000 COLONIES/mL ESCHERICHIA COLI  SARS Coronavirus 2 (CEPHEID - Performed in [place] hospital lab), Hosp Order     Status: None   Collection Time: 05/03/19  7:18 PM   Specimen: Nasopharyngeal Swab  Result Value Ref Range Status   SARS Coronavirus 2 NEGATIVE NEGATIVE Final    Comment: (NOTE) If result is NEGATIVE SARS-CoV-2 target nucleic acids are NOT DETECTED. The SARS-CoV-2 RNA is generally detectable in upper and lower  respiratory specimens during the acute phase of infection. The lowest  concentration of SARS-CoV-2 viral copies this assay can detect is 250  copies / mL. A negative result does not preclude SARS-CoV-2 infection  and should not be used as the sole basis for treatment or other  patient management decisions.  A negative result may occur with  improper specimen collection / handling, submission of specimen other  than nasopharyngeal swab, presence of viral mutation(s) within the  areas targeted by this assay, and inadequate number of viral copies  (<250 copies / mL). A negative result must be combined with clinical  observations, patient history, and epidemiological information. If result is POSITIVE SARS-CoV-2 target nucleic acids are DETECTED. The SARS-CoV-2 RNA is generally detectable in upper and lower  respiratory specimens dur ing the acute phase of infection.  Positive  results are indicative of  active infection with SARS-CoV-2.  Clinical  correlation with  patient history and other diagnostic information is  necessary to determine patient infection status.  Positive results do  not rule out bacterial infection or co-infection with other viruses. If result is PRESUMPTIVE POSTIVE SARS-CoV-2 nucleic acids MAY BE PRESENT.   A presumptive positive result was obtained on the submitted specimen  and confirmed on repeat testing.  While 2019 novel coronavirus  (SARS-CoV-2) nucleic acids may be present in the submitted sample  additional confirmatory testing may be necessary for epidemiological  and / or clinical management purposes  to differentiate between  SARS-CoV-2 and other Sarbecovirus currently known to infect humans.  If clinically indicated additional testing with an alternate test  methodology [phone]) is advised. The SARS-CoV-2 RNA is generally  detectable in upper and lower respiratory sp ecimens during the acute  phase of infection. The expected result is Negative. Fact Sheet for Patients:  StrictlyIdeas.no Fact Sheet for Healthcare Providers: BankingDealers.co.za This test is not yet approved or cleared by the [place] FDA and has been authorized for detection and/or diagnosis of SARS-CoV-2 by FDA under an Emergency Use Authorization (EUA).  This EUA will remain in effect (meaning this test can be used) for the duration of the COVID-19 declaration under Section 564(b)(1) of the Act, 21 U.S.C. section 360bbb-3(b)(1), unless the authorization is terminated or revoked sooner. Performed at [facility], [place] [address] [place], [place] [address]    Time coordinating discharge: 35 minutes  SIGNED:  Kerney Elbe, DO [facility] 05/06/2019, 10:59 AM Pager is on [place]  If 7PM-7AM, please contact night-coverage [email or web address] Password [email or web address]

## 2019-05-06 NOTE — TOC Transition Note (Signed)
Transition of Care [facility]) - CM/SW Discharge Note   Patient Details  Name: Christina Buckley MRN: [id number] Date of Birth: Dec 03, 1923  Transition of Care [facility]) CM/SW Contact:  Shade Flood, LCSW Phone Number: 05/06/2019, 12:33 PM   Clinical Narrative:     Pt stable for dc today per MD. Pt will be returning home with [facility] and care from her daughter. Spoke with Santiago Glad at [facility] to update on dc. There are no other TOC needs at this time.  Final next level of care: [place] Barriers to Discharge: Barriers Resolved   Patient Goals and CMS Choice Patient states their goals for this hospitalization and ongoing recovery are:: go home CMS Medicare.gov Compare Post Acute Care list provided to:: Patient Represenative (must comment)(dtr Vaughan Basta) Choice offered to / list presented to : Adult Children  Discharge Placement                       Discharge Plan and Services   Discharge Planning Services: CM Consult Post Acute Care Choice: Home Health                    HH Arranged: RN, PT, OT, Social Work, Nurse's Aide [place] Agency: [place] ([facility]) Date [place]: 05/06/19 Time [place]: 1233 Representative spoke with at [place]: [place] ([place]) Interventions     Readmission Risk Interventions Readmission Risk Prevention Plan 05/06/2019  Medication Screening Complete  Transportation Screening Complete  Some recent data might be hidden

## 2019-05-08 DIAGNOSIS — D3911 Neoplasm of uncertain behavior of right ovary: Secondary | ICD-10-CM | POA: Diagnosis not present

## 2019-05-08 DIAGNOSIS — D696 Thrombocytopenia, unspecified: Secondary | ICD-10-CM | POA: Diagnosis not present

## 2019-05-08 DIAGNOSIS — D649 Anemia, unspecified: Secondary | ICD-10-CM | POA: Diagnosis not present

## 2019-05-08 DIAGNOSIS — I340 Nonrheumatic mitral (valve) insufficiency: Secondary | ICD-10-CM | POA: Diagnosis not present

## 2019-05-08 DIAGNOSIS — I5042 Chronic combined systolic (congestive) and diastolic (congestive) heart failure: Secondary | ICD-10-CM | POA: Diagnosis not present

## 2019-05-08 DIAGNOSIS — E876 Hypokalemia: Secondary | ICD-10-CM | POA: Diagnosis not present

## 2019-05-08 DIAGNOSIS — M25552 Pain in left hip: Secondary | ICD-10-CM | POA: Diagnosis not present

## 2019-05-08 DIAGNOSIS — I480 Paroxysmal atrial fibrillation: Secondary | ICD-10-CM | POA: Diagnosis not present

## 2019-05-08 DIAGNOSIS — I110 Hypertensive heart disease with heart failure: Secondary | ICD-10-CM | POA: Diagnosis not present

## 2019-05-08 DIAGNOSIS — K219 Gastro-esophageal reflux disease without esophagitis: Secondary | ICD-10-CM | POA: Diagnosis not present

## 2019-05-08 DIAGNOSIS — M4185 Other forms of scoliosis, thoracolumbar region: Secondary | ICD-10-CM | POA: Diagnosis not present

## 2019-05-08 DIAGNOSIS — Z87440 Personal history of urinary (tract) infections: Secondary | ICD-10-CM | POA: Diagnosis not present

## 2019-05-08 DIAGNOSIS — R600 Localized edema: Secondary | ICD-10-CM | POA: Diagnosis not present

## 2019-05-08 DIAGNOSIS — D3912 Neoplasm of uncertain behavior of left ovary: Secondary | ICD-10-CM | POA: Diagnosis not present

## 2019-05-10 DIAGNOSIS — R600 Localized edema: Secondary | ICD-10-CM | POA: Diagnosis not present

## 2019-05-10 DIAGNOSIS — I5042 Chronic combined systolic (congestive) and diastolic (congestive) heart failure: Secondary | ICD-10-CM | POA: Diagnosis not present

## 2019-05-10 DIAGNOSIS — M25552 Pain in left hip: Secondary | ICD-10-CM | POA: Diagnosis not present

## 2019-05-10 DIAGNOSIS — I340 Nonrheumatic mitral (valve) insufficiency: Secondary | ICD-10-CM | POA: Diagnosis not present

## 2019-05-10 DIAGNOSIS — I480 Paroxysmal atrial fibrillation: Secondary | ICD-10-CM | POA: Diagnosis not present

## 2019-05-10 DIAGNOSIS — I110 Hypertensive heart disease with heart failure: Secondary | ICD-10-CM | POA: Diagnosis not present

## 2019-05-11 DIAGNOSIS — M25552 Pain in left hip: Secondary | ICD-10-CM | POA: Diagnosis not present

## 2019-05-11 DIAGNOSIS — I110 Hypertensive heart disease with heart failure: Secondary | ICD-10-CM | POA: Diagnosis not present

## 2019-05-11 DIAGNOSIS — I340 Nonrheumatic mitral (valve) insufficiency: Secondary | ICD-10-CM | POA: Diagnosis not present

## 2019-05-11 DIAGNOSIS — R600 Localized edema: Secondary | ICD-10-CM | POA: Diagnosis not present

## 2019-05-11 DIAGNOSIS — I5042 Chronic combined systolic (congestive) and diastolic (congestive) heart failure: Secondary | ICD-10-CM | POA: Diagnosis not present

## 2019-05-11 DIAGNOSIS — I480 Paroxysmal atrial fibrillation: Secondary | ICD-10-CM | POA: Diagnosis not present

## 2019-05-12 ENCOUNTER — Other Ambulatory Visit: Payer: Self-pay

## 2019-05-12 DIAGNOSIS — N390 Urinary tract infection, site not specified: Secondary | ICD-10-CM | POA: Diagnosis not present

## 2019-05-12 NOTE — Patient Outreach (Signed)
[facility]) Care Management  05/12/2019  Christina Buckley 19-Sep-1924 [id number]    EMMI-General Discharge RED ON EMMI ALERT Day # 4 Date: 05/11/2019 Red Alert Reason: " Sad/hopeless/anxious/empty? Yes"   Outreach attempt # 1 to patient. Spoke with patient who denies any acute issues or concerns at present. She is pleased to share how well she is doing. She voices that she has "wonderful family support and is well-cared for." Reviewed and addressed red alert with patient. Error in response. She denies any of those feelings. She shares how her birthday is tomorrow and she is looking forward to it. She is in good spirits. She confirms that [facility] services in place and someone visited her on yesterday. She reports that she has all her meds in the home. She states that the Tramadol was too strong for her and made her not feel right. She is taking regular Tylenol for occasional left leg/hip pain and voices good relief from med. She reports that she was fearful about going to MD office for an appt due to her age and virus outbreak. Her daughter has scheduled her a telephone/virtual MD appt for follow up. She denies any further RN CM needs or concerns at this time. Patient has completed automated EMMI post discharge calls. She was appreciative of follow up call.      Plan: RN CM will close case as no further interventions needed at this time.   Enzo Montgomery, RN,BSN,CCM [place] Management Telephonic Care Management Coordinator Direct Phone: [phone] Toll Free: [phone] Fax: [phone]

## 2019-05-13 DIAGNOSIS — R1904 Left lower quadrant abdominal swelling, mass and lump: Secondary | ICD-10-CM | POA: Diagnosis not present

## 2019-05-13 DIAGNOSIS — M79605 Pain in left leg: Secondary | ICD-10-CM | POA: Diagnosis not present

## 2019-05-13 DIAGNOSIS — N838 Other noninflammatory disorders of ovary, fallopian tube and broad ligament: Secondary | ICD-10-CM | POA: Diagnosis not present

## 2019-05-13 DIAGNOSIS — N390 Urinary tract infection, site not specified: Secondary | ICD-10-CM | POA: Diagnosis not present

## 2019-05-24 ENCOUNTER — Other Ambulatory Visit: Payer: Self-pay

## 2019-05-26 DIAGNOSIS — I480 Paroxysmal atrial fibrillation: Secondary | ICD-10-CM | POA: Diagnosis not present

## 2019-05-26 DIAGNOSIS — I110 Hypertensive heart disease with heart failure: Secondary | ICD-10-CM | POA: Diagnosis not present

## 2019-05-26 DIAGNOSIS — R600 Localized edema: Secondary | ICD-10-CM | POA: Diagnosis not present

## 2019-05-26 DIAGNOSIS — N390 Urinary tract infection, site not specified: Secondary | ICD-10-CM | POA: Diagnosis not present

## 2019-05-26 DIAGNOSIS — I340 Nonrheumatic mitral (valve) insufficiency: Secondary | ICD-10-CM | POA: Diagnosis not present

## 2019-05-26 DIAGNOSIS — M25552 Pain in left hip: Secondary | ICD-10-CM | POA: Diagnosis not present

## 2019-05-26 DIAGNOSIS — I5042 Chronic combined systolic (congestive) and diastolic (congestive) heart failure: Secondary | ICD-10-CM | POA: Diagnosis not present

## 2019-06-01 DIAGNOSIS — I5042 Chronic combined systolic (congestive) and diastolic (congestive) heart failure: Secondary | ICD-10-CM | POA: Diagnosis not present

## 2019-06-01 DIAGNOSIS — M25552 Pain in left hip: Secondary | ICD-10-CM | POA: Diagnosis not present

## 2019-06-01 DIAGNOSIS — R600 Localized edema: Secondary | ICD-10-CM | POA: Diagnosis not present

## 2019-06-01 DIAGNOSIS — I480 Paroxysmal atrial fibrillation: Secondary | ICD-10-CM | POA: Diagnosis not present

## 2019-06-01 DIAGNOSIS — I110 Hypertensive heart disease with heart failure: Secondary | ICD-10-CM | POA: Diagnosis not present

## 2019-06-01 DIAGNOSIS — I340 Nonrheumatic mitral (valve) insufficiency: Secondary | ICD-10-CM | POA: Diagnosis not present

## 2019-06-07 DIAGNOSIS — D3912 Neoplasm of uncertain behavior of left ovary: Secondary | ICD-10-CM | POA: Diagnosis not present

## 2019-06-07 DIAGNOSIS — I480 Paroxysmal atrial fibrillation: Secondary | ICD-10-CM | POA: Diagnosis not present

## 2019-06-07 DIAGNOSIS — M25552 Pain in left hip: Secondary | ICD-10-CM | POA: Diagnosis not present

## 2019-06-07 DIAGNOSIS — D3911 Neoplasm of uncertain behavior of right ovary: Secondary | ICD-10-CM | POA: Diagnosis not present

## 2019-06-07 DIAGNOSIS — I5042 Chronic combined systolic (congestive) and diastolic (congestive) heart failure: Secondary | ICD-10-CM | POA: Diagnosis not present

## 2019-06-07 DIAGNOSIS — Z87440 Personal history of urinary (tract) infections: Secondary | ICD-10-CM | POA: Diagnosis not present

## 2019-06-07 DIAGNOSIS — M4185 Other forms of scoliosis, thoracolumbar region: Secondary | ICD-10-CM | POA: Diagnosis not present

## 2019-06-07 DIAGNOSIS — K219 Gastro-esophageal reflux disease without esophagitis: Secondary | ICD-10-CM | POA: Diagnosis not present

## 2019-06-07 DIAGNOSIS — E876 Hypokalemia: Secondary | ICD-10-CM | POA: Diagnosis not present

## 2019-06-07 DIAGNOSIS — I340 Nonrheumatic mitral (valve) insufficiency: Secondary | ICD-10-CM | POA: Diagnosis not present

## 2019-06-07 DIAGNOSIS — D696 Thrombocytopenia, unspecified: Secondary | ICD-10-CM | POA: Diagnosis not present

## 2019-06-07 DIAGNOSIS — R600 Localized edema: Secondary | ICD-10-CM | POA: Diagnosis not present

## 2019-06-07 DIAGNOSIS — I110 Hypertensive heart disease with heart failure: Secondary | ICD-10-CM | POA: Diagnosis not present

## 2019-06-07 DIAGNOSIS — D649 Anemia, unspecified: Secondary | ICD-10-CM | POA: Diagnosis not present

## 2019-06-08 DIAGNOSIS — R600 Localized edema: Secondary | ICD-10-CM | POA: Diagnosis not present

## 2019-06-08 DIAGNOSIS — M25552 Pain in left hip: Secondary | ICD-10-CM | POA: Diagnosis not present

## 2019-06-08 DIAGNOSIS — I5042 Chronic combined systolic (congestive) and diastolic (congestive) heart failure: Secondary | ICD-10-CM | POA: Diagnosis not present

## 2019-06-08 DIAGNOSIS — I480 Paroxysmal atrial fibrillation: Secondary | ICD-10-CM | POA: Diagnosis not present

## 2019-06-08 DIAGNOSIS — I110 Hypertensive heart disease with heart failure: Secondary | ICD-10-CM | POA: Diagnosis not present

## 2019-06-08 DIAGNOSIS — I340 Nonrheumatic mitral (valve) insufficiency: Secondary | ICD-10-CM | POA: Diagnosis not present

## 2019-06-22 DIAGNOSIS — Z139 Encounter for screening, unspecified: Secondary | ICD-10-CM | POA: Diagnosis not present

## 2019-06-22 DIAGNOSIS — I5042 Chronic combined systolic (congestive) and diastolic (congestive) heart failure: Secondary | ICD-10-CM | POA: Diagnosis not present

## 2019-06-22 DIAGNOSIS — R1904 Left lower quadrant abdominal swelling, mass and lump: Secondary | ICD-10-CM | POA: Diagnosis not present

## 2019-06-22 DIAGNOSIS — Z1331 Encounter for screening for depression: Secondary | ICD-10-CM | POA: Diagnosis not present

## 2019-06-22 DIAGNOSIS — Z9181 History of falling: Secondary | ICD-10-CM | POA: Diagnosis not present

## 2019-06-22 DIAGNOSIS — M79605 Pain in left leg: Secondary | ICD-10-CM | POA: Diagnosis not present

## 2019-06-22 DIAGNOSIS — I4891 Unspecified atrial fibrillation: Secondary | ICD-10-CM | POA: Diagnosis not present

## 2019-06-30 DIAGNOSIS — Z23 Encounter for immunization: Secondary | ICD-10-CM | POA: Diagnosis not present

## 2019-10-26 DIAGNOSIS — R404 Transient alteration of awareness: Secondary | ICD-10-CM | POA: Diagnosis not present

## 2019-10-26 DIAGNOSIS — W19XXXA Unspecified fall, initial encounter: Secondary | ICD-10-CM | POA: Diagnosis not present

## 2019-11-22 DIAGNOSIS — 419620001 Death: Secondary | SNOMED CT | POA: Diagnosis not present

## 2019-11-22 DEATH — deceased

## 2019-11-24 ENCOUNTER — Other Ambulatory Visit: Payer: Self-pay | Admitting: Cardiovascular Disease

## 2020-07-22 IMAGING — MR MRI PELVIS WITHOUT AND WITH CONTRAST
8 of 13 series · 18 of 48 positions shown · IV contrast (gadavist)
Comparison: MRI hip from 05/03/2019

CLINICAL DATA: Ovarian cystic masses.

EXAM:
MRI PELVIS WITHOUT AND WITH CONTRAST
TECHNIQUE: Multiplanar multisequence MR imaging of the pelvis was performed
both before and after administration of intravenous contrast.
CONTRAST:  7.5 cc Gadavist

[Series 4: T2 · coronal · 6.0mm · 0.86mm/px · 1 of 25 slices shown (1 of 4)]
[im 1/25]
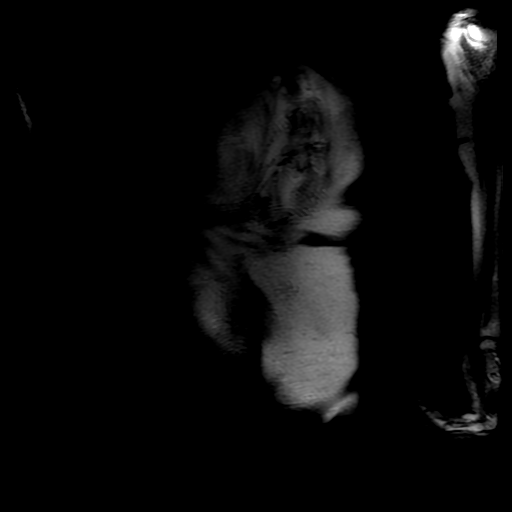

[Series 5: T2 · coronal · 5.0mm · 0.47mm/px · 2 of 30 slices shown (2 of 4)]
[im 1/30]
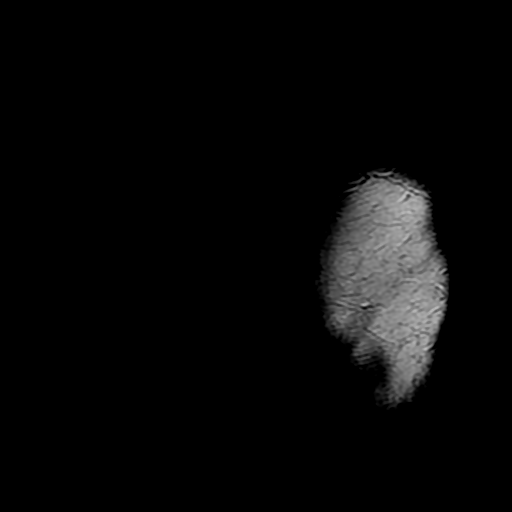
[im 30/30]
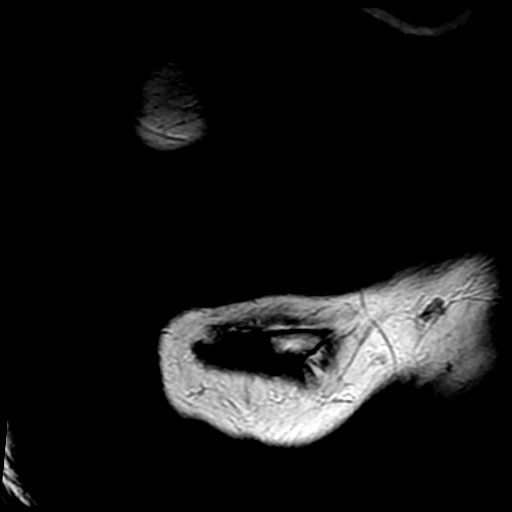

[Series 6: T2 · axial · 5.0mm · 0.47mm/px · z∈[-188,+40]mm · 3 of 39 slices shown (3 of 4)]
[im 1/39]
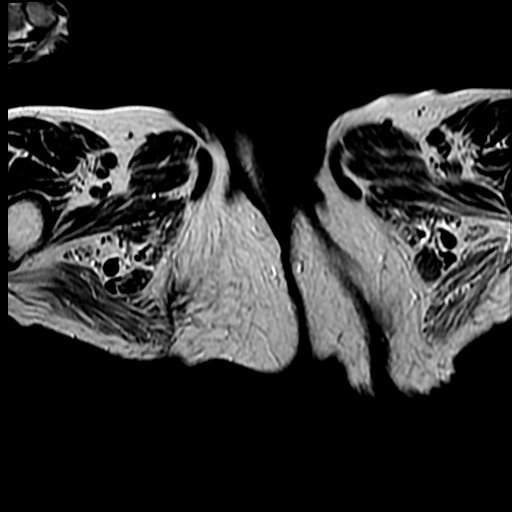
[im 20/39]
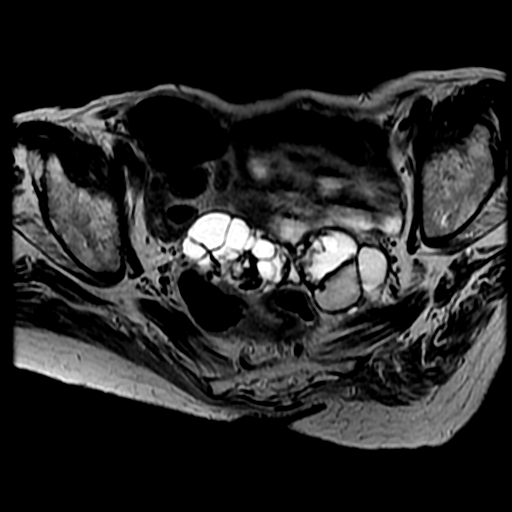
[im 39/39]
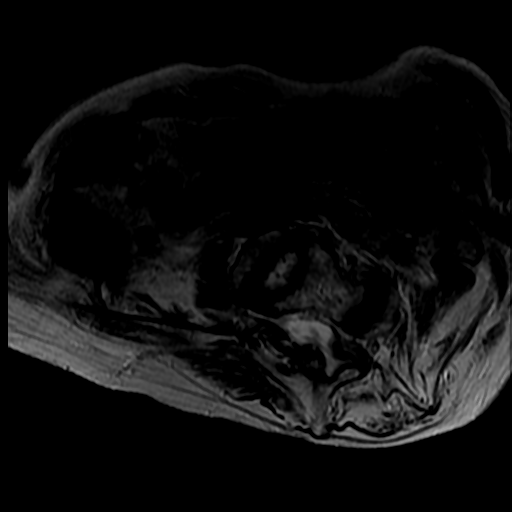

[Series 7: T2 fat-sat · axial · 5.0mm · 0.47mm/px · z∈[-188,+40]mm · 3 of 39 slices shown]
[im 1/39]
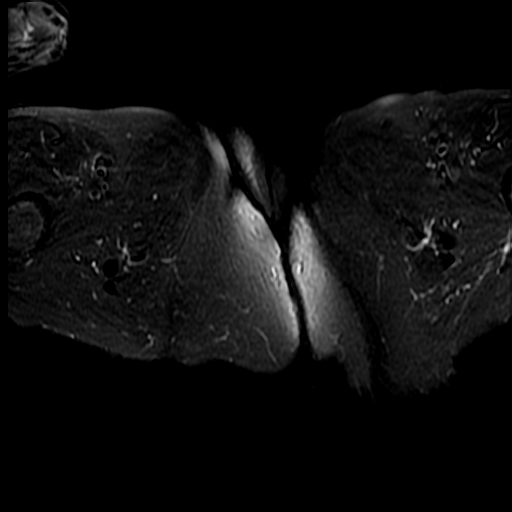
[im 20/39]
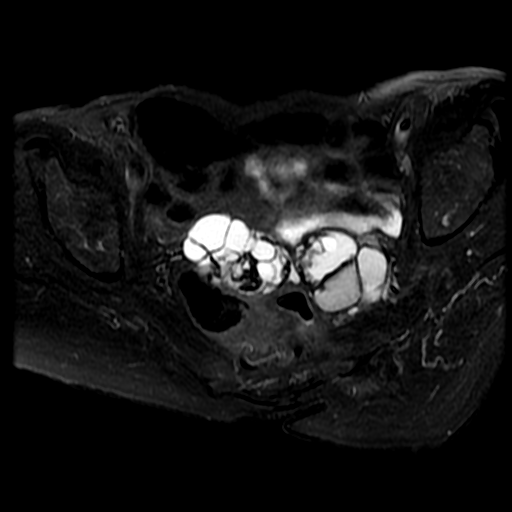
[im 39/39]
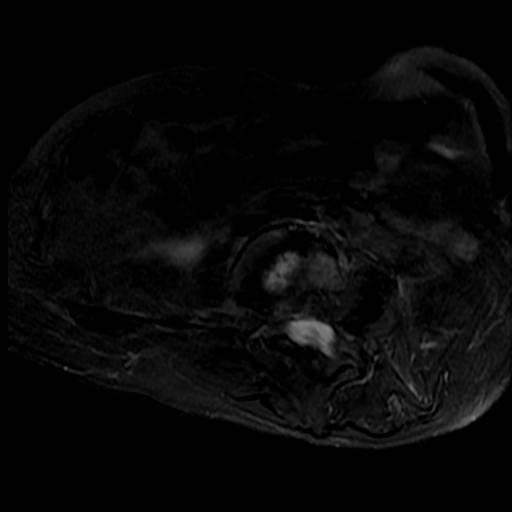

[Series 8: T2 · sagittal · 5.0mm · 0.47mm/px · 2 of 28 slices shown (4 of 4)]
[im 1/28]
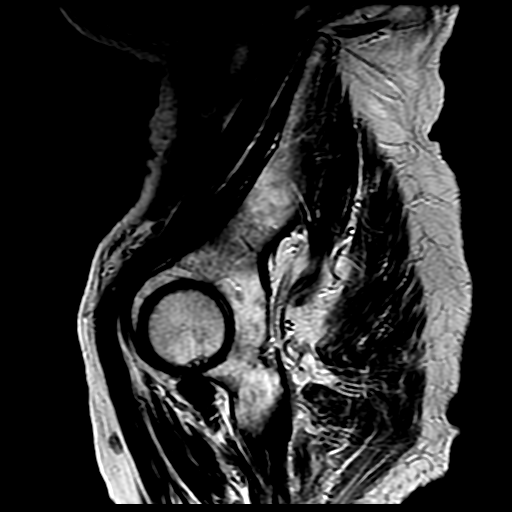
[im 28/28]
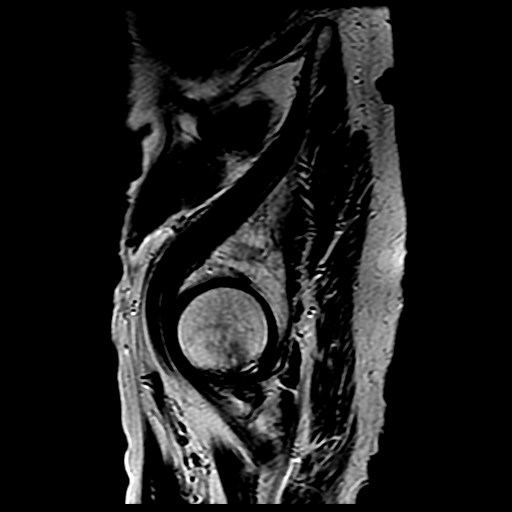

[Series 9: T1 · axial · 5.0mm · 0.47mm/px · z∈[-188,+40]mm · 3 of 39 slices shown]
[im 1/39]
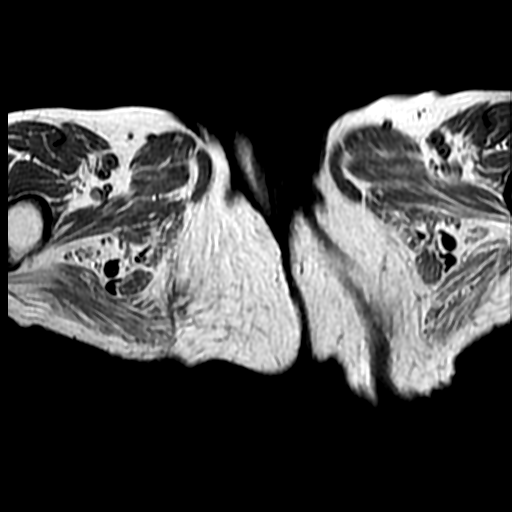
[im 20/39]
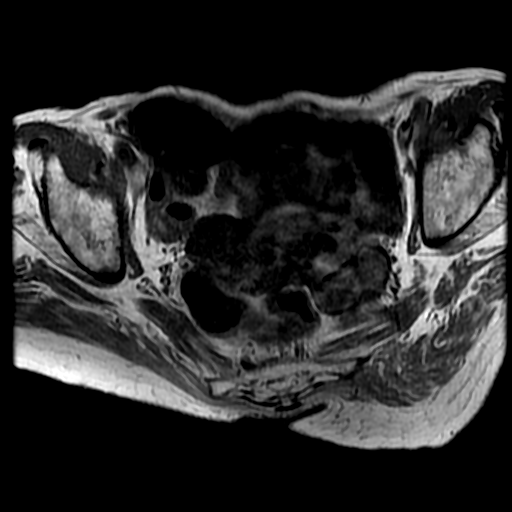
[im 39/39]
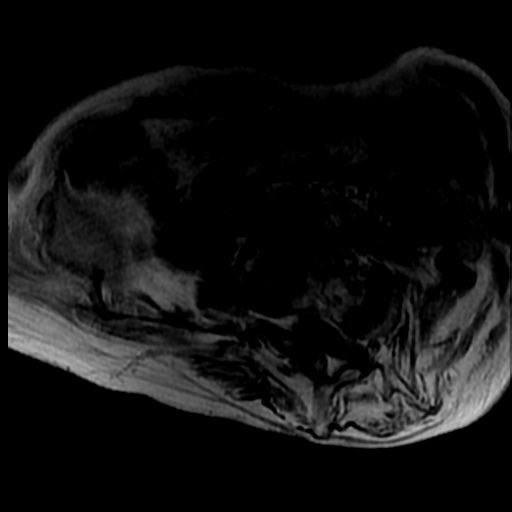

[Series 15: T2 post-contrast · sagittal · 5.0mm · 0.47mm/px · 2 of 28 slices shown]
[im 1/28]
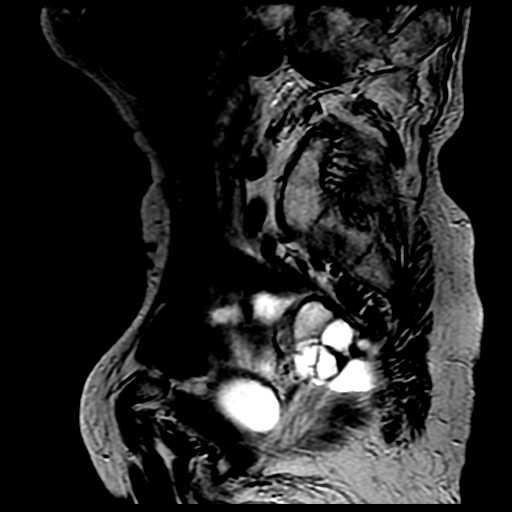
[im 28/28]
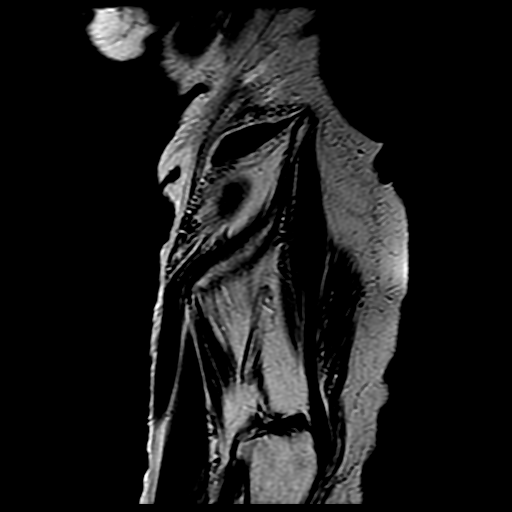

[Series 16: T1 fat-sat post-contrast · axial · 5.0mm · 0.47mm/px · z∈[-188,-74]mm · 2 of 39 slices shown]
[im 1/39]
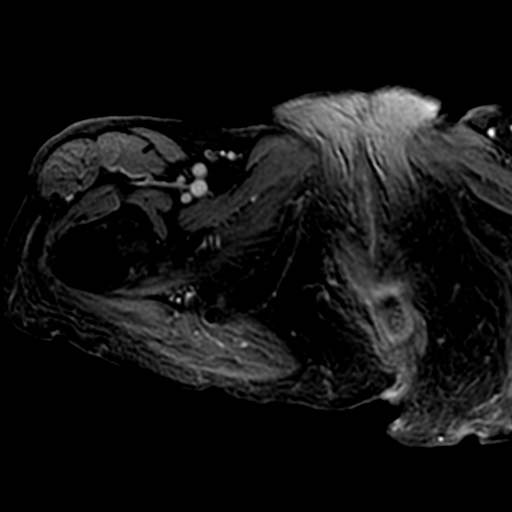
[im 20/39]
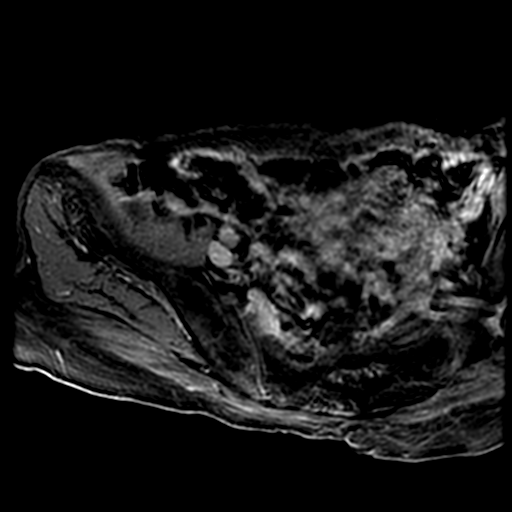

[18 of 48 positions shown; findings below may reference images not displayed]

FINDINGS: Urinary Tract:  Unremarkable

Bowel:  Unremarkable where included.

Vascular/Lymphatic: No pathologic adenopathy. Overall unremarkable
where included.

Reproductive: The uterus is not observed. Above the level of the
vaginal cuff, there is a multi-cystic process likely comprised of
both ovaries. One of the multi cystic components likely representing
the right ovary measures 5.6 by 3.6 by 3.9 cm (volume = 41 cm^3),
with multiple septations and components with differing signal
intensity but mostly T2 hyperintense. The other component likely
corresponding to the left ovary measures 5.4 by 4.7 by 3.8 cm
(volume = 50 cm^3) and likewise contains multiple cysts with
somewhat thick septations with minimal if any enhancement, no
definite papillary projections, and some variability in signal
intensity of the internal lesions. No significant degree of
surrounding free fluid. No well-defined enhancement on subtraction
images.

Other:  No supplemental non-categorized findings.

Musculoskeletal: Severe levoconvex lower thoracic and upper lumbar
scoliosis. On coronal images, cysts of the lateral segment left
hepatic lobe are appreciable
IMPRESSION: 1. Complex cystic lesions along the vaginal cuff likely
corresponding to the original ovaries, which appear enlarged and
with thickened areas of septation, complexity in some of the cystic
elements, but no well-defined papillary projections or internal
enhancement. The appearance is unusual. When considering bilateral
cystic lesions of the ovaries, usually serous cystadenoma, serous
cystadenocarcinoma, or ovarian metastatic disease are some of the
more common causes. The thick irregular walls and septations without
well-defined solid components are more characteristic of mucinous
cystadenoma, but mucinous cystadenoma is rarely bilateral. Other
cystic multilocular processes can include endometriosis but this
would typically be during reproductive age. Borderline tumors can
have some of these features but are typically in younger patients
and often have papillary projections. Overall the imaging appearance
is not entirely specific. I am very skeptical that the appearance is
solely due to bilateral hydrosalpinx given the lack of a serpentine
appearance. Overall I feel this likely warrants assessment with
serologic tumor markers and potentially referral for laparoscopy
partially dependent on the patient's other risk factors.
2. Severe levoconvex lower thoracic and upper lumbar scoliosis.
3. Partially imaged cysts of the left hepatic lobe noted.
